# Patient Record
Sex: Female | Born: 1945
Health system: Southern US, Community
[De-identification: ages and names within clinical notes are randomized; demographics above are authoritative.]

## PROBLEM LIST (undated history)

## (undated) DIAGNOSIS — M1990 Unspecified osteoarthritis, unspecified site: Secondary | ICD-10-CM

## (undated) DIAGNOSIS — F419 Anxiety disorder, unspecified: Secondary | ICD-10-CM

## (undated) DIAGNOSIS — E785 Hyperlipidemia, unspecified: Secondary | ICD-10-CM

## (undated) DIAGNOSIS — F32A Depression, unspecified: Secondary | ICD-10-CM

## (undated) DIAGNOSIS — I10 Essential (primary) hypertension: Secondary | ICD-10-CM

## (undated) DIAGNOSIS — R112 Nausea with vomiting, unspecified: Secondary | ICD-10-CM

## (undated) DIAGNOSIS — G8929 Other chronic pain: Secondary | ICD-10-CM

## (undated) DIAGNOSIS — K219 Gastro-esophageal reflux disease without esophagitis: Secondary | ICD-10-CM

## (undated) DIAGNOSIS — H3530 Unspecified macular degeneration: Secondary | ICD-10-CM

## (undated) DIAGNOSIS — Z9889 Other specified postprocedural states: Secondary | ICD-10-CM

## (undated) DIAGNOSIS — M8580 Other specified disorders of bone density and structure, unspecified site: Secondary | ICD-10-CM

## (undated) DIAGNOSIS — E039 Hypothyroidism, unspecified: Secondary | ICD-10-CM

## (undated) DIAGNOSIS — R7303 Prediabetes: Secondary | ICD-10-CM

## (undated) HISTORY — PX: ANTERIOR CERVICAL DISCECTOMY: SHX1160

## (undated) HISTORY — PX: POSTERIOR CERVICAL FUSION/FORAMINOTOMY: SHX5038

## (undated) HISTORY — PX: COLONOSCOPY: SHX174

## (undated) HISTORY — PX: BACK SURGERY: SHX140

## (undated) HISTORY — DX: Prediabetes: R73.03

## (undated) HISTORY — PX: BREAST CYST EXCISION: SHX579

## (undated) HISTORY — PX: HAND SURGERY: SHX662

## (undated) HISTORY — PX: CHOLECYSTECTOMY: SHX55

## (undated) HISTORY — PX: APPENDECTOMY: SHX54

## (undated) HISTORY — PX: ABDOMINAL HYSTERECTOMY: SHX81

## (undated) HISTORY — PX: BUNIONECTOMY: SHX129

## (undated) HISTORY — DX: Hyperlipidemia, unspecified: E78.5

## (undated) HISTORY — DX: Other chronic pain: G89.29

## (undated) HISTORY — DX: Other specified disorders of bone density and structure, unspecified site: M85.80

---

## 1999-03-21 ENCOUNTER — Other Ambulatory Visit: Admission: RE | Admit: 1999-03-21 | Discharge: 1999-03-21 | Payer: Self-pay | Admitting: Obstetrics and Gynecology

## 2000-05-01 ENCOUNTER — Other Ambulatory Visit: Admission: RE | Admit: 2000-05-01 | Discharge: 2000-05-01 | Payer: Self-pay | Admitting: Obstetrics and Gynecology

## 2000-10-26 ENCOUNTER — Ambulatory Visit (HOSPITAL_COMMUNITY): Admission: RE | Admit: 2000-10-26 | Discharge: 2000-10-26 | Payer: Self-pay | Admitting: Neurosurgery

## 2000-10-26 ENCOUNTER — Encounter: Payer: Self-pay | Admitting: Neurosurgery

## 2000-11-28 ENCOUNTER — Encounter: Payer: Self-pay | Admitting: Neurosurgery

## 2000-11-28 ENCOUNTER — Ambulatory Visit (HOSPITAL_COMMUNITY): Admission: RE | Admit: 2000-11-28 | Discharge: 2000-11-28 | Payer: Self-pay | Admitting: Neurosurgery

## 2001-07-02 ENCOUNTER — Encounter: Payer: Self-pay | Admitting: Neurosurgery

## 2001-07-06 ENCOUNTER — Encounter: Payer: Self-pay | Admitting: Neurosurgery

## 2001-07-06 ENCOUNTER — Inpatient Hospital Stay (HOSPITAL_COMMUNITY): Admission: RE | Admit: 2001-07-06 | Discharge: 2001-07-08 | Payer: Self-pay | Admitting: Neurosurgery

## 2001-07-30 ENCOUNTER — Encounter: Payer: Self-pay | Admitting: Neurosurgery

## 2001-07-30 ENCOUNTER — Encounter: Admission: RE | Admit: 2001-07-30 | Discharge: 2001-07-30 | Payer: Self-pay | Admitting: Neurosurgery

## 2001-09-06 ENCOUNTER — Other Ambulatory Visit: Admission: RE | Admit: 2001-09-06 | Discharge: 2001-09-06 | Payer: Self-pay | Admitting: Obstetrics and Gynecology

## 2001-09-28 ENCOUNTER — Encounter: Payer: Self-pay | Admitting: Neurosurgery

## 2001-09-28 ENCOUNTER — Ambulatory Visit (HOSPITAL_COMMUNITY): Admission: RE | Admit: 2001-09-28 | Discharge: 2001-09-28 | Payer: Self-pay | Admitting: Neurosurgery

## 2001-10-19 ENCOUNTER — Encounter: Payer: Self-pay | Admitting: Neurosurgery

## 2001-10-19 ENCOUNTER — Encounter: Admission: RE | Admit: 2001-10-19 | Discharge: 2001-10-19 | Payer: Self-pay | Admitting: Neurosurgery

## 2001-11-03 ENCOUNTER — Encounter: Payer: Self-pay | Admitting: Neurosurgery

## 2001-11-03 ENCOUNTER — Encounter: Admission: RE | Admit: 2001-11-03 | Discharge: 2001-11-03 | Payer: Self-pay | Admitting: Neurosurgery

## 2001-11-19 ENCOUNTER — Encounter: Admission: RE | Admit: 2001-11-19 | Discharge: 2001-11-19 | Payer: Self-pay | Admitting: Neurosurgery

## 2001-11-19 ENCOUNTER — Encounter: Payer: Self-pay | Admitting: Neurosurgery

## 2002-11-23 ENCOUNTER — Other Ambulatory Visit: Admission: RE | Admit: 2002-11-23 | Discharge: 2002-11-23 | Payer: Self-pay | Admitting: Obstetrics and Gynecology

## 2003-10-09 ENCOUNTER — Encounter: Admission: RE | Admit: 2003-10-09 | Discharge: 2003-10-09 | Payer: Self-pay | Admitting: Infectious Diseases

## 2004-01-16 ENCOUNTER — Inpatient Hospital Stay (HOSPITAL_COMMUNITY): Admission: RE | Admit: 2004-01-16 | Discharge: 2004-01-18 | Payer: Self-pay | Admitting: Neurosurgery

## 2005-01-16 ENCOUNTER — Other Ambulatory Visit: Admission: RE | Admit: 2005-01-16 | Discharge: 2005-01-16 | Payer: Self-pay | Admitting: Obstetrics and Gynecology

## 2006-04-07 ENCOUNTER — Ambulatory Visit (HOSPITAL_COMMUNITY): Admission: RE | Admit: 2006-04-07 | Discharge: 2006-04-08 | Payer: Self-pay | Admitting: Neurosurgery

## 2006-04-14 ENCOUNTER — Ambulatory Visit (HOSPITAL_COMMUNITY): Admission: RE | Admit: 2006-04-14 | Discharge: 2006-04-15 | Payer: Self-pay | Admitting: Neurosurgery

## 2008-03-24 ENCOUNTER — Inpatient Hospital Stay (HOSPITAL_COMMUNITY): Admission: RE | Admit: 2008-03-24 | Discharge: 2008-03-25 | Payer: Self-pay | Admitting: Neurosurgery

## 2011-04-01 NOTE — H&P (Signed)
NAMEKYLAN, VEACH               ACCOUNT NO.:  [id number]   MEDICAL RECORD NO.:  [id number]          PATIENT TYPE:  INP   LOCATION:  3005                         FACILITY:  [facility]   PHYSICIAN:  Payton Doughty, M.D.      DATE OF BIRTH:  1945-11-23   DATE OF ADMISSION:  03/24/2008  DATE OF DISCHARGE:                              HISTORY & PHYSICAL   ADMITTING DIAGNOSIS:  Nonfunctioning spinal cord stimulator.   SURGEON:  Payton Doughty, MD, neurosurgery.   BODY OF TEXT:  This is a 65 year old right-handed white girl who has had  spinal cord stimulator in for back pain for about 4 years.  It worked  pretty well for a couple of years, but she has had increasing pain and  is not really happy with the amount of coverage that she is getting, so  she is here to remove the stimulator.   MEDICAL HISTORY:  Reasonably benign.  She has had hypothyroidism and she  is on Premarin.  She has had a cholecystectomy and hysterectomy.  She  has also had an anterior decompression and fusion and posterior cervical  fusion.   She gets nauseated with CODEINE.   SOCIAL HISTORY:  Does not smoke, does not drink, and she is currently  retired.   REVIEW OF SYSTEMS:  Marked for glasses, balance disturbance secondary to  macular degeneration, sore throat, leg pain, leg weakness, back pain,  neck pain, thyroid disease, and difficulty concentration.  Her HEENT  exam within normal limits.  She has reasonable range of motion of neck.  CHEST: Clear.  CARDIAC Exam: Regular rate and rhythm.  ABDOMEN: Large,  but nontender with no hepatosplenomegaly.  EXTREMITIES: Without clubbing  or cyanosis.  GU Exam: Deferred.  Peripheral pulses are good.  NEUROLOGICAL: She is awake, alert and oriented.  Cranial nerves are  intact.  Motor exam shows 5/5 strength throughout the upper and lower  extremities.  There is no evidence of myelopathy.  Reflexes are absent  at the knees and ankles.  Her incisions of her stimulator were well  healed.   CLINICAL IMPRESSION:  Nonfunctioning spinal cord stimulator for removal,  so we can get an MRI of that.  The risks and benefits of this approach  have been discussed with her and she wished to proceed.           ______________________________  Payton Doughty, M.D.     MWR/MEDQ  D:  03/24/2008  T:  03/25/2008  Job:  [id number]

## 2011-04-01 NOTE — Op Note (Signed)
NAMEJANYLA, [name]               ACCOUNT NO.:  [id number]   MEDICAL RECORD NO.:  [id number]          PATIENT TYPE:  INP   LOCATION:  3005                         FACILITY:  [facility]   PHYSICIAN:  Payton Doughty, M.D.      DATE OF BIRTH:  1946-11-09   DATE OF PROCEDURE:  03/24/2008  DATE OF DISCHARGE:                               OPERATIVE REPORT   PREOPERATIVE DIAGNOSIS:  Nonfunctioning spinal cord stimulator.   POSTOPERATIVE DIAGNOSIS:  Nonfunctioning spinal cord stimulator.   PROCEDURE:  Removal of spinal cord stimulator.   DICTATING DOCTOR:  Payton Doughty, MD, neurosurgery.   ANESTHESIA:  Endotracheal.   PREP:  Prepped with alcohol wipe.   COMPLICATIONS:  None.   NURSE ASSISTANT:  [place].   BODY TEXT:  This is a 65 year old girl with a spinal cord stimulator and  it has not helped very much, she wants for removal.  Taken to operating  room, the site was intubated, placed prone on the operating table  following shave, prep and drape in the usual sterile fashion.  Old two  skin incisions were re-opened and the wires were visualized and  dissected down underneath the lamina in the thoracic spine.  A small  part of the lamina had to be removed to mobilize the stimulator in the  epidural space.  It was removed without difficulty.  No CSF was noted.  The battery was accessed in the subcutaneous tunnel and removed.  Wire  was divided and removed in their entirety.  Both incisions were  irrigated.  Hemostasis assured, closed with successive layers of 0  Vicryl, 2-0 Vicryl, and 3-0 nylon.  Betadine and Telfa dressings were  applied and the patient returned to recovery room in good condition.           ______________________________  Payton Doughty, M.D.     MWR/MEDQ  D:  03/24/2008  T:  03/25/2008  Job:  [id number]

## 2011-04-04 NOTE — Op Note (Signed)
[facility]  Patient:    JOURDAN, MALDONADO Visit Number: [id number] MRN: [id number]          Service Type: SUR Location: 3000 3003 01 Attending Physician:  Emeterio Reeve Proc. Date: 07/06/01 Adm. Date:  07/06/2001                             Operative Report  PREOPERATIVE DIAGNOSIS:  Cervical spondylosis, C4-5, C5-6, and C6-7.  POSTOPERATIVE DIAGNOSIS:  Cervical spondylosis, C4-5, C5-6, and C6-7.  PROCEDURE:  C4-5, C5-6, C6-7 anterior cervical diskectomy and fusion with a Tether plate.  SURGEON:  Payton Doughty, M.D.  ASSISTANT:  Tanya Nones. Jeral Fruit, M.D.  ANESTHESIA:  General endotracheal.  PREPARATION:  Sterile Betadine prep and scrub with alcohol wipe.  COMPLICATIONS:  None.  DESCRIPTION OF PROCEDURE:  A 65 year old right-handed white lady with severe cervical spondylosis and cord compression with early myelopathy.  She was taken to the operating room and smoothly anesthetized and intubated, placed supine on the operating table.  Following shave, prep, and drape in the usual sterile fashion, the skin was incised starting two fingerbreadths below the carotid tubercle paralleling the sternocleidomastoid muscle, extending to three fingerbreadths above the carotid tubercle.  Platysma was identified, elevated, divided, and undermined.  The sternocleidomastoid was identified, and medial dissection revealed the carotid artery, which was retracted laterally to the left, trachea and esophagus retracted laterally to the right, exposing the bones of the anterior cervical spine.  A marker was placed, intraoperative x-ray obtained to confirm correctness of level.  Having confirmed correctness of the level, the longus colli was taken down over C4, C5, C6, and C7 and the Shadow Line self-retaining retractor placed transversely in a cephalocaudal direction.  The diskectomy was carried out at C4-5, C5-6, and C6-7 under gross observation.  The operating  microscope was then brought in and a microdissection technique used to dissect the anterior epidural space, remove the osteophytes and disk material.  At C4-5, there was extensive loss of disk with an in-line osteophyte.  At C5-6, there was, is the level _____ worse, there was an osteophytic bar across the anterior epidural space.  This was removed without difficulty and the neural foramina carefully explored and found to be open.  At 6-7 there was more disk than spur but still significant spurring, more so on the right than on the left.  There was a compression of the right C7 root.  Following complete decompression and exploration of all six nerve roots, making sure that they were decompressed, 7 mm bone grafts were fashioned from patellar allograft and tapped into place. A three-level Tether plate was then placed using 12 mm screws, two in C7, one at C6, one in C5.  The 5-6 screw slightly entered the graft, which was perfectly fine.  An adequate purchase had been obtained.  The C4 screw, the left-sided C4 screw required a rescue screw, but good purchase was once again obtained.  Intraoperative x-ray showed good placement of bone grafts and plate and screws.  The wound was irrigated and hemostasis assured.  The platysma was reapproximated with 3-0 Vicryl in interrupted fashion, subcutaneous tissue was reapproximated with 3-0 Vicryl in interrupted fashion.  The skin was closed with 4-0 Vicryl in running subcuticular fashion.  Benzoin and Steri-Strips were placed and made occlusive with Telfa and OpSite.  The patient then returned to the recovery room in good condition. Attending Physician:  Emeterio Reeve  DD:  07/06/01 TD:  07/07/01 Job: [id number]

## 2011-04-04 NOTE — Op Note (Signed)
NAMELETRICE, POLLOK               ACCOUNT NO.:  [id number]   MEDICAL RECORD NO.:  [id number]          PATIENT TYPE:  AMB   LOCATION:  SDS                          FACILITY:  [facility]   PHYSICIAN:  Payton Doughty, M.D.      DATE OF BIRTH:  1946-05-16   DATE OF PROCEDURE:  04/14/2006  DATE OF DISCHARGE:                                 OPERATIVE REPORT   PREOPERATIVE DIAGNOSIS:  Spinal cord stimulator in place.   POSTOPERATIVE DIAGNOSIS:  Spinal cord stimulator in place.   OPERATIVE PROCEDURE:  Replacement of spinal cord stimulator battery.   SURGEON:  Payton Doughty, M.D.   NURSE ASSISTANT:  [facility].   SERVICE:  Neurosurgery.   ANESTHESIA:  General endotracheal.   PREPARATION:  Prepped with sterile Betadine prep and scrubbed with alcohol  wipe.   COMPLICATIONS:  None.   DESCRIPTION OF PROCEDURE:  [age]-year-old girl with a previously  placed spinal cord stimulator with good coverage was taken to the operating  room and smoothly anesthetized, intubated and placed prone on the operating  table and following shaving, prepped and draped in the usual sterile  fashion.  The old skin incision was reopened and the wires localized.  The  extension wire was disconnected.  A subcutaneous tunnel was created to the  right posterior iliac spine, where a subcutaneous pocket was created.  The  wires were passed and connected to the battery.  Impedance testing revealed  low impedance.  The battery was replaced.  Incisions were closed with  successive layers of 0 Vicryl, 2-0 Vicryl and 3-0 nylon, Betadine and a  Telfa dressing was applied and the patient returned to the recovery room in  good condition.           ______________________________  Payton Doughty, M.D.     MWR/MEDQ  D:  04/14/2006  T:  04/14/2006  Job:  [id number]

## 2011-04-04 NOTE — H&P (Signed)
[place]  Patient:    [name], MUN Visit Number: [id number] MRN: [id number]          Service Type: Attending:  Payton Doughty, M.D. Adm. Date:  07/06/01                           History and Physical  ADMITTING DIAGNOSIS:  Cervical spondylosis with myelopathy.  SERVICE:  Neurosurgery.  HISTORY OF PRESENT ILLNESS:  This is a 65 year old right-handed white lady I have been seeing since November; initially saw her for back pain.  She actually resolved that.  She also complained of neck, shoulder, and arm pain on the right side with some dysesthesias to the ring and middle finger on the right and she found some triceps weakness.  She underwent an MRI of her cervical spine that showed severe spondylosis at C4-5, C5-6, and C6-C7. Because of cord compression she is now admitted for anterior cervical diskectomy and fusion at those levels.  MEDICAL HISTORY:  Remarkable for cholecystectomy and hysterectomy in 1971 and 1991 respectively.  She has hypothyroidism for which she is on Synthroid.  MEDICATIONS:  Synthroid 0.05 mg a day and Premarin 0.625 mg a day.  ALLERGIES:  She gets headaches and nausea with CODEINE but does not have any allergies.  SOCIAL HISTORY:  She does not smoke, does not drink, was very busy taking care of her parent who has since passed away, and she has a full-time job in the [employer].  FAMILY HISTORY:  Her mom is 7 with hypertension.  Father had ankylosing spondylitis coronary artery disease; he did at 4.  REVIEW OF SYSTEMS:  Remarkable for glasses, balance disturbance secondary to macular degeneration, occasional sore throat, leg pain, leg weakness, back pain, neck pain, thyroid disease, difficulty with concentration.  PHYSICAL EXAMINATION:  HEENT:  Within normal limits.  NECK:  There is reasonable range of motion of her neck.  CHEST:  Clear.  CARDIAC:  Regular rate and rhythm.  ABDOMEN:  Nontender with  no hepatosplenomegaly.  EXTREMITIES:  Without clubbing or cyanosis.  GENITOURINARY:  Deferred.  PERIPHERAL PULSES:  Good.  NEUROLOGIC:  She is awake, alert, and oriented.  Her cranial nerves are intact.  Motor exam shows 5/5 strength throughout the upper extremity on the left side;  right upper extremity has 4/5 weakness of the triceps.  She has a right C7 and C6 sensory deficit.  Reflexes are 1 at the biceps, absent at the triceps, 1 at the brachioradialis, and Hoffmanns is positive on the right side, and on the left side the reflexes are 2 with no Hoffmanns.  Lower extremities have full strength, downgoing toes.  Reflexes are 1 at the knees, 1 at the ankles.  Straight leg raise is negative.  LABORATORY DATA:  MRI as outlined above shows severe spondylitic change at 4-5, 5-6, and 6-7 with cord compression.  CLINICAL IMPRESSION:  Cervical spondylosis with myelopathy.  PLAN:  Anterior cervical diskectomy and fusion at C4-5, C5-6, and C6-C7.  The risks and benefits of this approach have been discussed with her and she wishes to proceed. Attending:  Payton Doughty, M.D. DD:  07/06/01 TD:  07/06/01 Job: [id number]

## 2011-04-04 NOTE — Op Note (Signed)
[name], [name]               ACCOUNT NO.:  [id number]   MEDICAL RECORD NO.:  [id number]          PATIENT TYPE:  AMB   LOCATION:  SDS                          FACILITY:  [facility]   PHYSICIAN:  Payton Doughty, M.D.      DATE OF BIRTH:  Jan 02, 1946   DATE OF PROCEDURE:  04/07/2006  DATE OF DISCHARGE:                                 OPERATIVE REPORT   PREOPERATIVE DIAGNOSIS:  Intractable back pain.   POSTOPERATIVE DIAGNOSIS:  Intractable back pain.   OPERATIVE PROCEDURE:  Placement of spinal cord stimulator.   SURGEON:  Payton Doughty, MD, Department of Neurosurgery.   ANESTHESIA:  General endotracheal.   PREP:  __________and prepped with alcohol wipe.   COMPLICATIONS:  None.   NURSE ASSISTANT:  [place].   BODY OF TEXT:  A 65 year old girl with intractable back pain taken to the  operating room, smoothly anesthetized and intubated, placed prone on the  operating table, the back shaved, prepped, and draped in the usual sterile  fashion.  The skin was incised from mid T12 to mid T11, and the ligament of  T11 was dissected free in the subperiosteal plane.  Intraoperative x-ray  confirmed correctness level.  A partial laminectomy was performed to allow  access to the epidural space, and the tripolar electrode was advanced  posteriorly in the epidural space.  The electrode was advanced so it was  covering from the top of T10 to the top of T8 as this patient's pain was  mostly in her back up high.  Following confirmation of good placement, the  anchoring leads were placed.  Extension wires were attached.  They were  exited via the trocar.  The incision was irrigated and hemostasis was  assured, then closed with six successive layers of #0 Vicryl, #2-0 Vicryl,  and #3-0 nylon.  Both sites were dressed with Betadine and Telfa and made  occlusive with OP-Site, and the patient returned to the recovery room in  good condition.   PREP:           ______________________________  Payton Doughty, M.D.     MWR/MEDQ  D:  04/07/2006  T:  04/07/2006  Job:  [id number]

## 2011-04-04 NOTE — H&P (Signed)
[name], [name]               ACCOUNT NO.:  [id number]   MEDICAL RECORD NO.:  [id number]          PATIENT TYPE:  AMB   LOCATION:  SDS                          FACILITY:  [facility]   PHYSICIAN:  Payton Doughty, M.D.      DATE OF BIRTH:  1946-09-14   DATE OF ADMISSION:  04/14/2006  DATE OF DISCHARGE:                                HISTORY & PHYSICAL   ADMISSION DIAGNOSIS:  Implanted spinal cord stimulator with good coverage.   A 65 year old, right-hand, white lady 1 week ago underwent implantation of a  spinal cord stimulator for intractable back pain and has had good coverage  and is now admitted for placement of battery pack.   MEDICAL HISTORY:  Remarkable for cholecystectomy and hysterectomy in 1971  and 1991.  She has had an anterior decompression and fusion of C4-C7, also a  posterior augmentation several years later.  She is allergic to DEMEROL and  CODEINE.  Takes Darvocet.   FAMILY HISTORY:  Father is deceased from coronary artery disease.  Mother is  43 with hypertension.   REVIEW OF SYSTEMS:  Remarkable for glasses.  Blind secondary for macular  degeneration.  Sore throat, leg pain, leg weakness, back pain, neck pain,  thyroid disease, difficulty with concentration.   PHYSICAL EXAMINATION:  HEENT exam:  Within normal limits.  She has good range of motion of her neck.  CHEST:  Clear.  CARDIAC EXAM:  Regular rate and rhythm.  ABDOMEN:  Large, but nontender.  No hepatosplenomegaly.  EXTREMITIES: Without clubbing or cyanosis.  GU EXAM:  Deferred.  Peripheral pulses are good.  NEUROLOGIC:  She is awake, alert, and oriented.  Cranial nerves are intact.  Motor exam shows 5/5 strength throughout the upper and lower extremities.  There is no current sensory deficit.  Reflexes are nonmyelopathic at the  knees, absent at the ankles.  Straight-leg raise is negative.  She has  limited range of motion of her spine.  Her back bothers her when she has  been up and about.  Her studies  demonstrate spondylosis at all levels, worse  at 3-4 and 4-5.  The implantation site looks good.   CLINICAL IMPRESSION:  Effective spinal cord stimulator.  She is here for a  battery implant.  The risks and benefits of this approach have been  discussed with her and she wished to proceed.           ______________________________  Payton Doughty, M.D.     MWR/MEDQ  D:  04/14/2006  T:  04/14/2006  Job:  [id number]

## 2011-04-04 NOTE — H&P (Signed)
[name], [name]                           ACCOUNT NO.:  [id number]   MEDICAL RECORD NO.:  [id number]                   PATIENT TYPE:  INP   LOCATION:  2899                                 FACILITY:  [facility]   PHYSICIAN:  [name], M.D.                   DATE OF BIRTH:  [birth year]   DATE OF ADMISSION:  01/16/2004  DATE OF DISCHARGE:                                HISTORY & PHYSICAL   ADMISSION DIAGNOSIS:  Non-union at C6-7.   HISTORY:  This is a very nice now 65 year old right-handed white female who  underwent an intracervical discectomy and fusion from C4-C7 in August of  2002. She did well and then during a chiropractic x-ray was found to have a  non-union at C6-7 as well as a broken screw. She is now admitted for  posterior augmentation and revision.   PAST MEDICAL HISTORY:  Remarkable for a cholecystectomy and hysterectomy in  1971 and 1991, respectively. She has hypothyroidism for which she is on  Synthroid and a Vivelle patch. She takes Bextra once a day and  hydrochlorothiazide once a day.   ALLERGIES:  She is sensitive to Demerol and Codeine and its derivatives.   FAMILY HISTORY:  Father is dead from coronary artery disease. Mom is 81 with  hypertension.   REVIEW OF SYSTEMS:  Remarkable for glasses and balance disturbance secondary  to macular degeneration. Occasional sore throat, leg pain, leg weakness,  back pain, neck pain, thyroid disease, and difficulty with concentration.   PHYSICAL EXAMINATION:  HEENT:  Within normal limits.  NECK:  She has reasonable range of motion in her neck with some neck pain.  CHEST:  Clear.  CARDIAC:  Regular rate and rhythm.  ABDOMEN:  Nontender with no hepatosplenomegaly.  EXTREMITIES:  Without clubbing or cyanosis. Peripheral pulses are good.  GU:  Deferred.  NEUROLOGIC:  She is awake, alert, and oriented. Her cranial nerves are  intact. Motor exam shows 5/5 strength throughout the upper and lower  extremities. There is no  Kauffman's nor is she myelopathic.   CLINICAL INFORMATION:  Pseudoarthrosis of neck pain.   PLAN:  Posterior augmentation of the C6-7 level. The risks and benefits of  this approach have been discussed with her and she wishes to proceed.                                                [name], M.D.    MWR/MEDQ  D:  01/16/2004  T:  01/16/2004  Job:  [id number]

## 2011-04-04 NOTE — Op Note (Signed)
[name], [name]                           ACCOUNT NO.:  [id number]   MEDICAL RECORD NO.:  [id number]                   PATIENT TYPE:  INP   LOCATION:  2899                                 FACILITY:  [facility]   PHYSICIAN:  Payton Doughty, M.D.                   DATE OF BIRTH:  24-Dec-1945   DATE OF PROCEDURE:  01/16/2004  DATE OF DISCHARGE:                                 OPERATIVE REPORT   PREOPERATIVE DIAGNOSIS:  Nonunion C6-C7.   POSTOPERATIVE DIAGNOSIS:  Nonunion C6-C7.   OPERATIVE PROCEDURE:  C6-C7 posterior cervical fusion augmentation.   SURGEON:  Payton Doughty, M.D.   SERVICE:  Neurosurgery   ANESTHESIA:  General endotracheal anesthesia.   PREPARATION:  Betadine and alcohol wipe.   COMPLICATIONS:  None.   ASSISTANT:  Nurse assistant [facility]  Doctor assistant Hewitt Shorts, M.D.   BODY OF TEXT:  65 year old lady with fusion from C3 to C7 and a nonunion at  C5-C6.  She is taken to the operating room, general anesthesia induced, and  intubated, placed prone in the [place] head holder.  Following shave, prep,  and drape in the usual sterile fashion, the skin was incised from the top of  C4 to the bottom of C7 and the lamina of C5, C6, and C7, were exposed  bilaterally in a subperiosteal plane.  The interoperative x-ray confirmed  correctness of the level.  Having confirmed correctness of the level,  lateral mass screws were placed in C7 and C6 using the standard landmarks.  Interoperative x-ray showed acceptable placement of the screws.  They were  connected with the connecting rod and locked in place.  C6 and C7 lamina and  facet joint were decorticated and packed with bone graft made from  morselized allograft and DBX.  Prior to this, the entire area had been  irrigated and hemostasis assured.  The fascia was reapproximated with 0  Vicryl in an interrupted fashion.  The subcutaneous tissues were  reapproximated with 0 Vicryl in an interrupted fashion.  The  subcuticular  tissues were reapproximated with 3-0 Vicryl in an interrupted fashion.  The  skin was closed with 3-0 nylon in a running locked fashion.  Betadine and  Telfa dressing was applied and made occlusive with OpSite and the patient  returned to the recovery room in good condition.                                               Payton Doughty, M.D.    MWR/MEDQ  D:  01/16/2004  T:  01/16/2004  Job:  [id number]

## 2011-04-04 NOTE — H&P (Signed)
[name], [name]               ACCOUNT NO.:  [id number]   MEDICAL RECORD NO.:  [id number]          PATIENT TYPE:  AMB   LOCATION:  [facility]                         FACILITY:  [facility]   PHYSICIAN:  Payton Doughty, M.D.      DATE OF BIRTH:  04-05-1946   DATE OF ADMISSION:  04/07/2006  DATE OF DISCHARGE:                                HISTORY & PHYSICAL   ADMISSION DIAGNOSIS:  Extensive lumbar spondylosis and with intractable back  pain.   BODY OF TEXT:  A now 65 year old right-handed white lady who had back pain  for numerous years, has a family history on both sides of intractable back  pain and lumbar spondylosis.  She is a candidate for fusion but because she  does not have any neurologic deficit would like to pursue a non-fusion  intervention for pain control, and she was thus admitted for a spinal cord  stimulator.   Medical issues remarkable for a cholecystectomy and a hysterectomy in 1971  and 1991.  She has also had anterior decompression and fusion from C4 to C7  and posterior augmentation several years later.   ALLERGIES:  DEMEROL and CODEINE.   MEDICATIONS:  Darvocet.   FAMILY HISTORY:  Her daddy is dead from coronary artery disease.  Mother is  24 with hypertension.   REVIEW OF SYSTEMS:  Remarkable for glasses __________ secondary to macular  degeneration, sore throat, leg pain, leg weakness, back pain, neck pain,  thyroid disease, difficulty with concentration.  She also had a toothache  and has been on antibiotic but has no evidence of abscess in her mouth.   PHYSICAL EXAMINATION:  HEENT:  Within normal limits.  NECK:  She has good range of motion of her neck.  CHEST:  Clear.  CARDIAC:  Regular rate and rhythm.  ABDOMEN:  Large but nontender with no hepatosplenomegaly.  EXTREMITIES:  Without any clubbing or cyanosis.  GENITOURINARY:  Deferred.  VASCULAR:  Peripheral pulses are good.  NEUROLOGIC:  She is awake, alert and oriented.  Cranial nerves are intact.  Motor  exam shows 5/5 strength throughout the upper and lower extremities.  There is no current sensory deficit.  Reflexes are non-myelopathic at the  knees, absent at the ankles.  Straight leg raise is negative.  She has  limited range of motion of her spine and her back bothers her when she has  been up and about.   Studies demonstrate spondylosis at all levels, probably worse at L4-5 than  L3-4.   CLINICAL IMPRESSION:  Intractable back pain related to spondylosis.   PLAN:  Placement of spinal cord stimulator.  The risks and benefits of this  approach have been discussed with her, and she wishes to proceed.           ______________________________  Payton Doughty, M.D.     MWR/MEDQ  D:  04/07/2006  T:  04/07/2006  Job:  [id number]

## 2011-08-13 LAB — COMPREHENSIVE METABOLIC PANEL
ALT: 25
AST: 29
Albumin: 4.1
Alkaline Phosphatase: 85
BUN: 19
CO2: 29
Calcium: 9.8
Chloride: 96
Creatinine, Ser: 0.79
GFR calc Af Amer: >60
GFR calc non Af Amer: >60
Glucose, Bld: 98
Potassium: 3.4 — ABNORMAL LOW
Sodium: 138
Total Bilirubin: 0.6
Total Protein: 7.7

## 2011-08-13 LAB — URINALYSIS, ROUTINE W REFLEX MICROSCOPIC
Bilirubin Urine: NEGATIVE
Glucose, UA: NEGATIVE
Hgb urine dipstick: NEGATIVE
Ketones, ur: NEGATIVE
Nitrite: NEGATIVE
Protein, ur: NEGATIVE
Specific Gravity, Urine: 1.014
Urobilinogen, UA: 0.2
pH: 5

## 2011-08-13 LAB — DIFFERENTIAL
Basophils Absolute: 0
Basophils Relative: 0
Eosinophils Absolute: 0.1
Eosinophils Relative: 1
Lymphocytes Relative: 35
Lymphs Abs: 2.8
Monocytes Absolute: 0.7
Monocytes Relative: 8
Neutro Abs: 4.4
Neutrophils Relative %: 55

## 2011-08-13 LAB — CBC
HCT: 44.5
Hemoglobin: 15.3 — ABNORMAL HIGH
MCHC: 34.4
MCV: 92.1
Platelets: 301
RBC: 4.83
RDW: 13.1
WBC: 8

## 2011-08-13 LAB — PROTIME-INR
INR: 0.9
Prothrombin Time: 12.6

## 2011-08-13 LAB — APTT: aPTT: 31

## 2013-03-23 ENCOUNTER — Other Ambulatory Visit: Payer: Self-pay | Admitting: Neurosurgery

## 2013-03-23 DIAGNOSIS — M47817 Spondylosis without myelopathy or radiculopathy, lumbosacral region: Secondary | ICD-10-CM

## 2013-03-30 ENCOUNTER — Ambulatory Visit
Admission: RE | Admit: 2013-03-30 | Discharge: 2013-03-30 | Disposition: A | Discharge status: Home / Self Care | Payer: Medicare Other | Source: Ambulatory Visit | Attending: Neurosurgery | Admitting: Neurosurgery

## 2013-03-30 DIAGNOSIS — M47817 Spondylosis without myelopathy or radiculopathy, lumbosacral region: Secondary | ICD-10-CM

## 2013-04-05 ENCOUNTER — Other Ambulatory Visit: Payer: Self-pay | Admitting: Family Medicine

## 2013-04-05 DIAGNOSIS — Z1231 Encounter for screening mammogram for malignant neoplasm of breast: Secondary | ICD-10-CM

## 2013-04-18 ENCOUNTER — Encounter (INDEPENDENT_AMBULATORY_CARE_PROVIDER_SITE_OTHER): Payer: Medicare Other | Admitting: Ophthalmology

## 2013-04-18 DIAGNOSIS — H35039 Hypertensive retinopathy, unspecified eye: Secondary | ICD-10-CM

## 2013-04-18 DIAGNOSIS — H3530 Unspecified macular degeneration: Secondary | ICD-10-CM

## 2013-04-18 DIAGNOSIS — H2510 Age-related nuclear cataract, unspecified eye: Secondary | ICD-10-CM

## 2013-04-18 DIAGNOSIS — I10 Essential (primary) hypertension: Secondary | ICD-10-CM

## 2013-04-19 ENCOUNTER — Ambulatory Visit
Admission: RE | Admit: 2013-04-19 | Discharge: 2013-04-19 | Disposition: A | Discharge status: Home / Self Care | Payer: Medicare Other | Source: Ambulatory Visit | Attending: Family Medicine | Admitting: Family Medicine

## 2013-04-19 DIAGNOSIS — Z1231 Encounter for screening mammogram for malignant neoplasm of breast: Secondary | ICD-10-CM

## 2013-05-09 ENCOUNTER — Other Ambulatory Visit: Payer: Self-pay | Admitting: Family Medicine

## 2013-05-09 DIAGNOSIS — R928 Other abnormal and inconclusive findings on diagnostic imaging of breast: Secondary | ICD-10-CM

## 2013-05-23 ENCOUNTER — Ambulatory Visit
Admission: RE | Admit: 2013-05-23 | Discharge: 2013-05-23 | Disposition: A | Discharge status: Home / Self Care | Payer: Medicare Other | Source: Ambulatory Visit | Attending: Family Medicine | Admitting: Family Medicine

## 2013-05-23 DIAGNOSIS — R928 Other abnormal and inconclusive findings on diagnostic imaging of breast: Secondary | ICD-10-CM

## 2014-06-07 ENCOUNTER — Ambulatory Visit (INDEPENDENT_AMBULATORY_CARE_PROVIDER_SITE_OTHER): Payer: Medicare Other | Admitting: Ophthalmology

## 2014-07-05 ENCOUNTER — Ambulatory Visit (INDEPENDENT_AMBULATORY_CARE_PROVIDER_SITE_OTHER): Payer: Medicare Other | Admitting: Ophthalmology

## 2014-07-17 ENCOUNTER — Ambulatory Visit (INDEPENDENT_AMBULATORY_CARE_PROVIDER_SITE_OTHER): Payer: Medicare Other | Admitting: Ophthalmology

## 2014-08-24 ENCOUNTER — Ambulatory Visit (INDEPENDENT_AMBULATORY_CARE_PROVIDER_SITE_OTHER): Payer: Medicare Other | Admitting: Ophthalmology

## 2014-10-05 ENCOUNTER — Ambulatory Visit (INDEPENDENT_AMBULATORY_CARE_PROVIDER_SITE_OTHER): Payer: Medicare Other | Admitting: Ophthalmology

## 2014-10-05 DIAGNOSIS — H43813 Vitreous degeneration, bilateral: Secondary | ICD-10-CM

## 2014-10-05 DIAGNOSIS — I10 Essential (primary) hypertension: Secondary | ICD-10-CM

## 2014-10-05 DIAGNOSIS — H3531 Nonexudative age-related macular degeneration: Secondary | ICD-10-CM

## 2014-10-05 DIAGNOSIS — H35033 Hypertensive retinopathy, bilateral: Secondary | ICD-10-CM

## 2014-12-04 ENCOUNTER — Other Ambulatory Visit: Payer: Self-pay

## 2014-12-04 DIAGNOSIS — Z1231 Encounter for screening mammogram for malignant neoplasm of breast: Secondary | ICD-10-CM

## 2014-12-05 ENCOUNTER — Ambulatory Visit
Admission: RE | Admit: 2014-12-05 | Discharge: 2014-12-05 | Disposition: A | Discharge status: Home / Self Care | Payer: Medicare Other | Source: Ambulatory Visit

## 2014-12-05 DIAGNOSIS — Z1231 Encounter for screening mammogram for malignant neoplasm of breast: Secondary | ICD-10-CM

## 2015-10-22 ENCOUNTER — Ambulatory Visit (INDEPENDENT_AMBULATORY_CARE_PROVIDER_SITE_OTHER): Payer: Medicare Other | Admitting: Ophthalmology

## 2016-02-19 ENCOUNTER — Other Ambulatory Visit: Payer: Self-pay

## 2016-02-19 DIAGNOSIS — Z1231 Encounter for screening mammogram for malignant neoplasm of breast: Secondary | ICD-10-CM

## 2016-03-19 ENCOUNTER — Ambulatory Visit
Admission: RE | Admit: 2016-03-19 | Discharge: 2016-03-19 | Disposition: A | Discharge status: Home / Self Care | Payer: Medicare Other | Source: Ambulatory Visit

## 2016-03-19 DIAGNOSIS — Z1231 Encounter for screening mammogram for malignant neoplasm of breast: Secondary | ICD-10-CM

## 2017-03-11 ENCOUNTER — Other Ambulatory Visit: Payer: Self-pay | Admitting: Family Medicine

## 2017-03-11 DIAGNOSIS — Z1231 Encounter for screening mammogram for malignant neoplasm of breast: Secondary | ICD-10-CM

## 2017-04-01 ENCOUNTER — Ambulatory Visit: Payer: Medicare Other

## 2017-04-15 ENCOUNTER — Ambulatory Visit
Admission: RE | Admit: 2017-04-15 | Discharge: 2017-04-15 | Disposition: A | Discharge status: Home / Self Care | Payer: Medicare Other | Source: Ambulatory Visit | Attending: Family Medicine | Admitting: Family Medicine

## 2017-04-15 DIAGNOSIS — Z1231 Encounter for screening mammogram for malignant neoplasm of breast: Secondary | ICD-10-CM

## 2017-11-17 DIAGNOSIS — N189 Chronic kidney disease, unspecified: Secondary | ICD-10-CM

## 2017-11-17 HISTORY — DX: Chronic kidney disease, unspecified: N18.9

## 2017-12-01 ENCOUNTER — Other Ambulatory Visit: Payer: Self-pay | Admitting: Neurosurgery

## 2017-12-01 DIAGNOSIS — M47816 Spondylosis without myelopathy or radiculopathy, lumbar region: Secondary | ICD-10-CM

## 2017-12-06 ENCOUNTER — Inpatient Hospital Stay
Admission: RE | Admit: 2017-12-06 | Discharge: 2017-12-06 | Disposition: A | Discharge status: Home / Self Care | Payer: Medicare Other | Source: Ambulatory Visit | Attending: Neurosurgery | Admitting: Neurosurgery

## 2017-12-06 ENCOUNTER — Ambulatory Visit
Admission: RE | Admit: 2017-12-06 | Discharge: 2017-12-06 | Disposition: A | Discharge status: Home / Self Care | Payer: Medicare Other | Source: Ambulatory Visit | Attending: Neurosurgery | Admitting: Neurosurgery

## 2017-12-06 DIAGNOSIS — M47816 Spondylosis without myelopathy or radiculopathy, lumbar region: Secondary | ICD-10-CM

## 2017-12-07 ENCOUNTER — Ambulatory Visit
Admission: RE | Admit: 2017-12-07 | Discharge: 2017-12-07 | Disposition: A | Discharge status: Home / Self Care | Payer: Medicare Other | Source: Ambulatory Visit | Attending: Neurosurgery | Admitting: Neurosurgery

## 2017-12-07 DIAGNOSIS — M47816 Spondylosis without myelopathy or radiculopathy, lumbar region: Secondary | ICD-10-CM

## 2018-04-20 ENCOUNTER — Other Ambulatory Visit: Payer: Self-pay | Admitting: Family Medicine

## 2018-04-20 DIAGNOSIS — Z1231 Encounter for screening mammogram for malignant neoplasm of breast: Secondary | ICD-10-CM

## 2018-05-12 ENCOUNTER — Ambulatory Visit
Admission: RE | Admit: 2018-05-12 | Discharge: 2018-05-12 | Disposition: A | Discharge status: Home / Self Care | Payer: Medicare Other | Source: Ambulatory Visit | Attending: Family Medicine | Admitting: Family Medicine

## 2018-05-12 DIAGNOSIS — Z1231 Encounter for screening mammogram for malignant neoplasm of breast: Secondary | ICD-10-CM

## 2019-02-01 ENCOUNTER — Other Ambulatory Visit: Payer: Self-pay

## 2019-02-01 ENCOUNTER — Ambulatory Visit
Admission: RE | Admit: 2019-02-01 | Discharge: 2019-02-01 | Disposition: A | Discharge status: Home / Self Care | Payer: Medicare Other | Source: Ambulatory Visit | Attending: Nurse Practitioner | Admitting: Nurse Practitioner

## 2019-02-01 ENCOUNTER — Other Ambulatory Visit: Payer: Self-pay | Admitting: Nurse Practitioner

## 2019-02-01 DIAGNOSIS — M25461 Effusion, right knee: Secondary | ICD-10-CM

## 2019-04-28 ENCOUNTER — Other Ambulatory Visit: Payer: Self-pay | Admitting: Family Medicine

## 2019-04-28 DIAGNOSIS — Z1231 Encounter for screening mammogram for malignant neoplasm of breast: Secondary | ICD-10-CM

## 2019-05-03 ENCOUNTER — Other Ambulatory Visit: Payer: Self-pay | Admitting: Family Medicine

## 2019-05-03 DIAGNOSIS — M8580 Other specified disorders of bone density and structure, unspecified site: Secondary | ICD-10-CM

## 2019-06-14 ENCOUNTER — Ambulatory Visit: Payer: Medicare Other

## 2019-07-22 ENCOUNTER — Ambulatory Visit: Payer: Medicare Other

## 2019-09-02 ENCOUNTER — Other Ambulatory Visit: Payer: Self-pay

## 2019-09-02 ENCOUNTER — Ambulatory Visit
Admission: RE | Admit: 2019-09-02 | Discharge: 2019-09-02 | Disposition: A | Discharge status: Home / Self Care | Payer: Medicare Other | Source: Ambulatory Visit | Attending: Family Medicine | Admitting: Family Medicine

## 2019-09-02 DIAGNOSIS — Z1231 Encounter for screening mammogram for malignant neoplasm of breast: Secondary | ICD-10-CM

## 2020-07-05 ENCOUNTER — Other Ambulatory Visit: Payer: Self-pay | Admitting: Family Medicine

## 2020-07-05 DIAGNOSIS — M85851 Other specified disorders of bone density and structure, right thigh: Secondary | ICD-10-CM

## 2020-07-31 ENCOUNTER — Other Ambulatory Visit: Payer: Self-pay | Admitting: Family Medicine

## 2020-07-31 DIAGNOSIS — Z1231 Encounter for screening mammogram for malignant neoplasm of breast: Secondary | ICD-10-CM

## 2020-11-08 ENCOUNTER — Other Ambulatory Visit: Payer: Medicare Other

## 2020-11-08 ENCOUNTER — Ambulatory Visit: Payer: Medicare Other

## 2020-12-17 DIAGNOSIS — G8929 Other chronic pain: Secondary | ICD-10-CM | POA: Diagnosis not present

## 2020-12-17 DIAGNOSIS — I10 Essential (primary) hypertension: Secondary | ICD-10-CM | POA: Diagnosis not present

## 2020-12-17 DIAGNOSIS — E039 Hypothyroidism, unspecified: Secondary | ICD-10-CM | POA: Diagnosis not present

## 2020-12-17 DIAGNOSIS — M47816 Spondylosis without myelopathy or radiculopathy, lumbar region: Secondary | ICD-10-CM | POA: Diagnosis not present

## 2020-12-17 DIAGNOSIS — N1831 Chronic kidney disease, stage 3a: Secondary | ICD-10-CM | POA: Diagnosis not present

## 2020-12-31 DIAGNOSIS — E559 Vitamin D deficiency, unspecified: Secondary | ICD-10-CM | POA: Diagnosis not present

## 2020-12-31 DIAGNOSIS — J301 Allergic rhinitis due to pollen: Secondary | ICD-10-CM | POA: Diagnosis not present

## 2020-12-31 DIAGNOSIS — E039 Hypothyroidism, unspecified: Secondary | ICD-10-CM | POA: Diagnosis not present

## 2020-12-31 DIAGNOSIS — M85851 Other specified disorders of bone density and structure, right thigh: Secondary | ICD-10-CM | POA: Diagnosis not present

## 2020-12-31 DIAGNOSIS — M5440 Lumbago with sciatica, unspecified side: Secondary | ICD-10-CM | POA: Diagnosis not present

## 2020-12-31 DIAGNOSIS — G8929 Other chronic pain: Secondary | ICD-10-CM | POA: Diagnosis not present

## 2021-01-03 DIAGNOSIS — M461 Sacroiliitis, not elsewhere classified: Secondary | ICD-10-CM | POA: Diagnosis not present

## 2021-01-04 DIAGNOSIS — E039 Hypothyroidism, unspecified: Secondary | ICD-10-CM | POA: Diagnosis not present

## 2021-01-04 DIAGNOSIS — E559 Vitamin D deficiency, unspecified: Secondary | ICD-10-CM | POA: Diagnosis not present

## 2021-01-04 DIAGNOSIS — M85851 Other specified disorders of bone density and structure, right thigh: Secondary | ICD-10-CM | POA: Diagnosis not present

## 2021-01-04 DIAGNOSIS — I10 Essential (primary) hypertension: Secondary | ICD-10-CM | POA: Diagnosis not present

## 2021-01-04 DIAGNOSIS — N1831 Chronic kidney disease, stage 3a: Secondary | ICD-10-CM | POA: Diagnosis not present

## 2021-01-04 DIAGNOSIS — M5440 Lumbago with sciatica, unspecified side: Secondary | ICD-10-CM | POA: Diagnosis not present

## 2021-01-09 DIAGNOSIS — I10 Essential (primary) hypertension: Secondary | ICD-10-CM | POA: Diagnosis not present

## 2021-01-09 DIAGNOSIS — N1831 Chronic kidney disease, stage 3a: Secondary | ICD-10-CM | POA: Diagnosis not present

## 2021-01-09 DIAGNOSIS — M47816 Spondylosis without myelopathy or radiculopathy, lumbar region: Secondary | ICD-10-CM | POA: Diagnosis not present

## 2021-01-09 DIAGNOSIS — E039 Hypothyroidism, unspecified: Secondary | ICD-10-CM | POA: Diagnosis not present

## 2021-01-09 DIAGNOSIS — G8929 Other chronic pain: Secondary | ICD-10-CM | POA: Diagnosis not present

## 2021-01-21 DIAGNOSIS — M48062 Spinal stenosis, lumbar region with neurogenic claudication: Secondary | ICD-10-CM | POA: Diagnosis not present

## 2021-01-21 DIAGNOSIS — I10 Essential (primary) hypertension: Secondary | ICD-10-CM | POA: Diagnosis not present

## 2021-01-21 DIAGNOSIS — M461 Sacroiliitis, not elsewhere classified: Secondary | ICD-10-CM | POA: Diagnosis not present

## 2021-01-21 DIAGNOSIS — M961 Postlaminectomy syndrome, not elsewhere classified: Secondary | ICD-10-CM | POA: Diagnosis not present

## 2021-01-30 DIAGNOSIS — M545 Low back pain, unspecified: Secondary | ICD-10-CM | POA: Diagnosis not present

## 2021-01-30 DIAGNOSIS — M48062 Spinal stenosis, lumbar region with neurogenic claudication: Secondary | ICD-10-CM | POA: Diagnosis not present

## 2021-02-04 DIAGNOSIS — M461 Sacroiliitis, not elsewhere classified: Secondary | ICD-10-CM | POA: Diagnosis not present

## 2021-02-06 DIAGNOSIS — N1831 Chronic kidney disease, stage 3a: Secondary | ICD-10-CM | POA: Diagnosis not present

## 2021-02-06 DIAGNOSIS — G8929 Other chronic pain: Secondary | ICD-10-CM | POA: Diagnosis not present

## 2021-02-06 DIAGNOSIS — E039 Hypothyroidism, unspecified: Secondary | ICD-10-CM | POA: Diagnosis not present

## 2021-02-06 DIAGNOSIS — M47816 Spondylosis without myelopathy or radiculopathy, lumbar region: Secondary | ICD-10-CM | POA: Diagnosis not present

## 2021-02-06 DIAGNOSIS — I10 Essential (primary) hypertension: Secondary | ICD-10-CM | POA: Diagnosis not present

## 2021-02-12 DIAGNOSIS — M48062 Spinal stenosis, lumbar region with neurogenic claudication: Secondary | ICD-10-CM | POA: Diagnosis not present

## 2021-02-13 ENCOUNTER — Other Ambulatory Visit: Payer: Self-pay | Admitting: Neurological Surgery

## 2021-02-13 DIAGNOSIS — M48061 Spinal stenosis, lumbar region without neurogenic claudication: Secondary | ICD-10-CM

## 2021-02-18 ENCOUNTER — Ambulatory Visit
Admission: RE | Admit: 2021-02-18 | Discharge: 2021-02-18 | Disposition: A | Discharge status: Home / Self Care | Payer: Medicare Other | Source: Ambulatory Visit | Attending: Family Medicine | Admitting: Family Medicine

## 2021-02-18 ENCOUNTER — Other Ambulatory Visit: Payer: Medicare Other

## 2021-02-18 ENCOUNTER — Other Ambulatory Visit: Payer: Self-pay

## 2021-02-18 DIAGNOSIS — Z1231 Encounter for screening mammogram for malignant neoplasm of breast: Secondary | ICD-10-CM

## 2021-02-22 ENCOUNTER — Other Ambulatory Visit (HOSPITAL_COMMUNITY)
Admission: RE | Admit: 2021-02-22 | Discharge: 2021-02-22 | Disposition: A | Discharge status: Home / Self Care | Payer: Medicare Other | Source: Ambulatory Visit | Attending: Neurological Surgery | Admitting: Neurological Surgery

## 2021-02-22 ENCOUNTER — Encounter (HOSPITAL_COMMUNITY): Payer: Self-pay | Admitting: Neurological Surgery

## 2021-02-22 DIAGNOSIS — M4326 Fusion of spine, lumbar region: Secondary | ICD-10-CM | POA: Diagnosis not present

## 2021-02-22 DIAGNOSIS — M532X6 Spinal instabilities, lumbar region: Secondary | ICD-10-CM | POA: Diagnosis not present

## 2021-02-22 DIAGNOSIS — F419 Anxiety disorder, unspecified: Secondary | ICD-10-CM | POA: Diagnosis not present

## 2021-02-22 DIAGNOSIS — M48061 Spinal stenosis, lumbar region without neurogenic claudication: Secondary | ICD-10-CM | POA: Diagnosis not present

## 2021-02-22 DIAGNOSIS — Z885 Allergy status to narcotic agent status: Secondary | ICD-10-CM | POA: Diagnosis not present

## 2021-02-22 DIAGNOSIS — Z981 Arthrodesis status: Secondary | ICD-10-CM | POA: Diagnosis not present

## 2021-02-22 DIAGNOSIS — K219 Gastro-esophageal reflux disease without esophagitis: Secondary | ICD-10-CM | POA: Diagnosis not present

## 2021-02-22 DIAGNOSIS — Z01818 Encounter for other preprocedural examination: Secondary | ICD-10-CM | POA: Diagnosis not present

## 2021-02-22 DIAGNOSIS — M47816 Spondylosis without myelopathy or radiculopathy, lumbar region: Secondary | ICD-10-CM | POA: Diagnosis not present

## 2021-02-22 DIAGNOSIS — N189 Chronic kidney disease, unspecified: Secondary | ICD-10-CM | POA: Diagnosis not present

## 2021-02-22 DIAGNOSIS — G8929 Other chronic pain: Secondary | ICD-10-CM | POA: Diagnosis not present

## 2021-02-22 DIAGNOSIS — Z01812 Encounter for preprocedural laboratory examination: Secondary | ICD-10-CM | POA: Insufficient documentation

## 2021-02-22 DIAGNOSIS — Z20822 Contact with and (suspected) exposure to covid-19: Secondary | ICD-10-CM | POA: Insufficient documentation

## 2021-02-22 DIAGNOSIS — Z7982 Long term (current) use of aspirin: Secondary | ICD-10-CM | POA: Diagnosis not present

## 2021-02-22 DIAGNOSIS — Z79890 Hormone replacement therapy (postmenopausal): Secondary | ICD-10-CM | POA: Diagnosis not present

## 2021-02-22 DIAGNOSIS — Z91048 Other nonmedicinal substance allergy status: Secondary | ICD-10-CM | POA: Diagnosis not present

## 2021-02-22 DIAGNOSIS — Z79899 Other long term (current) drug therapy: Secondary | ICD-10-CM | POA: Diagnosis not present

## 2021-02-22 DIAGNOSIS — M4805 Spinal stenosis, thoracolumbar region: Secondary | ICD-10-CM | POA: Diagnosis not present

## 2021-02-22 DIAGNOSIS — N1831 Chronic kidney disease, stage 3a: Secondary | ICD-10-CM | POA: Diagnosis not present

## 2021-02-22 DIAGNOSIS — F32A Depression, unspecified: Secondary | ICD-10-CM | POA: Diagnosis not present

## 2021-02-22 DIAGNOSIS — M961 Postlaminectomy syndrome, not elsewhere classified: Secondary | ICD-10-CM | POA: Diagnosis not present

## 2021-02-22 DIAGNOSIS — E039 Hypothyroidism, unspecified: Secondary | ICD-10-CM | POA: Diagnosis not present

## 2021-02-22 DIAGNOSIS — I10 Essential (primary) hypertension: Secondary | ICD-10-CM | POA: Diagnosis not present

## 2021-02-22 DIAGNOSIS — I129 Hypertensive chronic kidney disease with stage 1 through stage 4 chronic kidney disease, or unspecified chronic kidney disease: Secondary | ICD-10-CM | POA: Diagnosis not present

## 2021-02-22 NOTE — Progress Notes (Signed)
Spoke with pt for pre-op call. Pt cardiac history or Diabetes. Pt is treated for HTN. Pt's PCP is Dr. Gweneth Dimitri.  Covid test done today, result pending. Pt states she's been in quarantine since the test was done and understands that she stays in quarantine until she comes to the hospital on Monday.

## 2021-02-23 LAB — SARS CORONAVIRUS 2 (TAT 6-24 HRS): SARS Coronavirus 2: NEGATIVE

## 2021-02-24 NOTE — Anesthesia Preprocedure Evaluation (Addendum)
Anesthesia Evaluation  Patient identified by MRN, date of birth, ID band Patient awake    Reviewed: Allergy & Precautions, H&P , NPO status , Patient's Chart, lab work & pertinent test results  History of Anesthesia Complications (+) PONV  Airway Mallampati: III  TM Distance: >3 FB Neck ROM: Full    Dental no notable dental hx. (+) Teeth Intact, Dental Advisory Given   Pulmonary neg pulmonary ROS,    Pulmonary exam normal breath sounds clear to auscultation       Cardiovascular Exercise Tolerance: Good hypertension, Pt. on medications  Rhythm:Regular Rate:Normal     Neuro/Psych Anxiety Depression negative neurological ROS     GI/Hepatic Neg liver ROS, GERD  ,  Endo/Other  Hypothyroidism   Renal/GU Renal InsufficiencyRenal disease  negative genitourinary   Musculoskeletal  (+) Arthritis , Osteoarthritis,    Abdominal   Peds  Hematology negative hematology ROS (+)   Anesthesia Other Findings   Reproductive/Obstetrics negative OB ROS                            Anesthesia Physical Anesthesia Plan  ASA: II  Anesthesia Plan: General   Post-op Pain Management:    Induction: Intravenous  PONV Risk Score and Plan: 4 or greater and Ondansetron, Dexamethasone, Aprepitant and Midazolam  Airway Management Planned: Oral ETT  Additional Equipment:   Intra-op Plan:   Post-operative Plan: Extubation in OR  Informed Consent: I have reviewed the patients History and Physical, chart, labs and discussed the procedure including the risks, benefits and alternatives for the proposed anesthesia with the patient or authorized representative who has indicated his/her understanding and acceptance.     Dental advisory given  Plan Discussed with: CRNA  Anesthesia Plan Comments:        Anesthesia Quick Evaluation

## 2021-02-25 ENCOUNTER — Inpatient Hospital Stay (HOSPITAL_COMMUNITY): Payer: Medicare Other

## 2021-02-25 ENCOUNTER — Encounter (HOSPITAL_COMMUNITY): Payer: Self-pay | Admitting: Neurological Surgery

## 2021-02-25 ENCOUNTER — Other Ambulatory Visit: Payer: Self-pay

## 2021-02-25 ENCOUNTER — Inpatient Hospital Stay (HOSPITAL_COMMUNITY): Payer: Medicare Other | Admitting: Anesthesiology

## 2021-02-25 ENCOUNTER — Encounter (HOSPITAL_COMMUNITY)
Admission: RE | Disposition: A | Discharge status: Home / Self Care | Payer: Self-pay | Source: Home / Self Care | Attending: Neurological Surgery

## 2021-02-25 ENCOUNTER — Inpatient Hospital Stay (HOSPITAL_COMMUNITY)
Admission: RE | Admit: 2021-02-25 | Discharge: 2021-02-26 | DRG: 455 | Disposition: A | Discharge status: Home / Self Care | Payer: Medicare Other | Attending: Neurological Surgery | Admitting: Neurological Surgery

## 2021-02-25 DIAGNOSIS — Z20822 Contact with and (suspected) exposure to covid-19: Secondary | ICD-10-CM | POA: Diagnosis present

## 2021-02-25 DIAGNOSIS — Z79890 Hormone replacement therapy (postmenopausal): Secondary | ICD-10-CM

## 2021-02-25 DIAGNOSIS — N189 Chronic kidney disease, unspecified: Secondary | ICD-10-CM | POA: Diagnosis present

## 2021-02-25 DIAGNOSIS — Z79899 Other long term (current) drug therapy: Secondary | ICD-10-CM

## 2021-02-25 DIAGNOSIS — F32A Depression, unspecified: Secondary | ICD-10-CM | POA: Diagnosis present

## 2021-02-25 DIAGNOSIS — K219 Gastro-esophageal reflux disease without esophagitis: Secondary | ICD-10-CM | POA: Diagnosis present

## 2021-02-25 DIAGNOSIS — Z981 Arthrodesis status: Secondary | ICD-10-CM | POA: Diagnosis not present

## 2021-02-25 DIAGNOSIS — M48061 Spinal stenosis, lumbar region without neurogenic claudication: Secondary | ICD-10-CM | POA: Diagnosis present

## 2021-02-25 DIAGNOSIS — I129 Hypertensive chronic kidney disease with stage 1 through stage 4 chronic kidney disease, or unspecified chronic kidney disease: Secondary | ICD-10-CM | POA: Diagnosis present

## 2021-02-25 DIAGNOSIS — Z7982 Long term (current) use of aspirin: Secondary | ICD-10-CM | POA: Diagnosis not present

## 2021-02-25 DIAGNOSIS — E039 Hypothyroidism, unspecified: Secondary | ICD-10-CM | POA: Diagnosis present

## 2021-02-25 DIAGNOSIS — M532X6 Spinal instabilities, lumbar region: Secondary | ICD-10-CM | POA: Diagnosis not present

## 2021-02-25 DIAGNOSIS — N1831 Chronic kidney disease, stage 3a: Secondary | ICD-10-CM | POA: Diagnosis not present

## 2021-02-25 DIAGNOSIS — F419 Anxiety disorder, unspecified: Secondary | ICD-10-CM | POA: Diagnosis present

## 2021-02-25 DIAGNOSIS — Z885 Allergy status to narcotic agent status: Secondary | ICD-10-CM

## 2021-02-25 DIAGNOSIS — Z01818 Encounter for other preprocedural examination: Secondary | ICD-10-CM | POA: Diagnosis not present

## 2021-02-25 DIAGNOSIS — M961 Postlaminectomy syndrome, not elsewhere classified: Secondary | ICD-10-CM | POA: Diagnosis not present

## 2021-02-25 DIAGNOSIS — Z91048 Other nonmedicinal substance allergy status: Secondary | ICD-10-CM | POA: Diagnosis not present

## 2021-02-25 DIAGNOSIS — M47816 Spondylosis without myelopathy or radiculopathy, lumbar region: Secondary | ICD-10-CM | POA: Diagnosis not present

## 2021-02-25 DIAGNOSIS — G8929 Other chronic pain: Secondary | ICD-10-CM | POA: Diagnosis not present

## 2021-02-25 DIAGNOSIS — Z419 Encounter for procedure for purposes other than remedying health state, unspecified: Secondary | ICD-10-CM

## 2021-02-25 DIAGNOSIS — M4326 Fusion of spine, lumbar region: Secondary | ICD-10-CM | POA: Diagnosis not present

## 2021-02-25 DIAGNOSIS — I10 Essential (primary) hypertension: Secondary | ICD-10-CM | POA: Diagnosis not present

## 2021-02-25 HISTORY — DX: Essential (primary) hypertension: I10

## 2021-02-25 HISTORY — DX: Gastro-esophageal reflux disease without esophagitis: K21.9

## 2021-02-25 HISTORY — DX: Unspecified osteoarthritis, unspecified site: M19.90

## 2021-02-25 HISTORY — DX: Unspecified macular degeneration: H35.30

## 2021-02-25 HISTORY — DX: Depression, unspecified: F32.A

## 2021-02-25 HISTORY — DX: Other specified postprocedural states: Z98.890

## 2021-02-25 HISTORY — DX: Other specified postprocedural states: R11.2

## 2021-02-25 HISTORY — DX: Anxiety disorder, unspecified: F41.9

## 2021-02-25 HISTORY — DX: Hypothyroidism, unspecified: E03.9

## 2021-02-25 LAB — CBC WITH DIFFERENTIAL/PLATELET
Abs Immature Granulocytes: 0.02 10*3/uL (ref 0.00–0.07)
Basophils Absolute: 0 10*3/uL (ref 0.0–0.1)
Basophils Relative: 1 %
Eosinophils Absolute: 0.3 10*3/uL (ref 0.0–0.5)
Eosinophils Relative: 3 %
HCT: 40.9 % (ref 36.0–46.0)
Hemoglobin: 13.6 g/dL (ref 12.0–15.0)
Immature Granulocytes: 0 %
Lymphocytes Relative: 25 %
Lymphs Abs: 2.1 10*3/uL (ref 0.7–4.0)
MCH: 31 pg (ref 26.0–34.0)
MCHC: 33.3 g/dL (ref 30.0–36.0)
MCV: 93.2 fL (ref 80.0–100.0)
Monocytes Absolute: 1 10*3/uL (ref 0.1–1.0)
Monocytes Relative: 12 %
Neutro Abs: 5.1 10*3/uL (ref 1.7–7.7)
Neutrophils Relative %: 59 %
Platelets: 282 10*3/uL (ref 150–400)
RBC: 4.39 MIL/uL (ref 3.87–5.11)
RDW: 13.8 % (ref 11.5–15.5)
WBC: 8.6 10*3/uL (ref 4.0–10.5)
nRBC: 0 % (ref 0.0–0.2)

## 2021-02-25 LAB — BASIC METABOLIC PANEL
Anion gap: 9 (ref 5–15)
BUN: 28 mg/dL — ABNORMAL HIGH (ref 8–23)
CO2: 22 mmol/L (ref 22–32)
Calcium: 9.2 mg/dL (ref 8.9–10.3)
Chloride: 105 mmol/L (ref 98–111)
Creatinine, Ser: 1.09 mg/dL — ABNORMAL HIGH (ref 0.44–1.00)
GFR, Estimated: 54 mL/min — ABNORMAL LOW (ref >60)
Glucose, Bld: 100 mg/dL — ABNORMAL HIGH (ref 70–99)
Potassium: 3.8 mmol/L (ref 3.5–5.1)
Sodium: 136 mmol/L (ref 135–145)

## 2021-02-25 LAB — PROTIME-INR
INR: 1 (ref 0.8–1.2)
Prothrombin Time: 12.8 seconds (ref 11.4–15.2)

## 2021-02-25 LAB — ABO/RH: ABO/RH(D): O POS

## 2021-02-25 LAB — TYPE AND SCREEN
ABO/RH(D): O POS
Antibody Screen: NEGATIVE

## 2021-02-25 SURGERY — POSTERIOR LUMBAR FUSION 1 LEVEL
Anesthesia: General | Site: Back

## 2021-02-25 MED ORDER — APREPITANT 40 MG PO CAPS
40.0000 mg | ORAL_CAPSULE | ORAL | Status: AC
  Administered 2021-02-25: 40 mg via ORAL
  Filled 2021-02-25: qty 1

## 2021-02-25 MED ORDER — ASPIRIN 81 MG PO CHEW
81.0000 mg | CHEWABLE_TABLET | Freq: Every day | ORAL | Status: DC
  Administered 2021-02-25 – 2021-02-26 (×2): 81 mg via ORAL
  Filled 2021-02-25 (×2): qty 1

## 2021-02-25 MED ORDER — PROPOFOL 1000 MG/100ML IV EMUL
INTRAVENOUS | Status: AC
  Filled 2021-02-25: qty 100

## 2021-02-25 MED ORDER — ACETAMINOPHEN 500 MG PO TABS
1000.0000 mg | ORAL_TABLET | Freq: Once | ORAL | Status: AC
  Administered 2021-02-25: 1000 mg via ORAL
  Filled 2021-02-25: qty 2

## 2021-02-25 MED ORDER — ADULT MULTIVITAMIN W/MINERALS CH
1.0000 | ORAL_TABLET | Freq: Every day | ORAL | Status: DC
  Administered 2021-02-25 – 2021-02-26 (×2): 1 via ORAL
  Filled 2021-02-25 (×2): qty 1

## 2021-02-25 MED ORDER — ONDANSETRON HCL 4 MG/2ML IJ SOLN
INTRAMUSCULAR | Status: AC
  Filled 2021-02-25: qty 2

## 2021-02-25 MED ORDER — ALBUMIN HUMAN 5 % IV SOLN: INTRAVENOUS | Status: DC | PRN

## 2021-02-25 MED ORDER — DULOXETINE HCL 30 MG PO CPEP
60.0000 mg | ORAL_CAPSULE | Freq: Every day | ORAL | Status: DC
  Administered 2021-02-25: 60 mg via ORAL
  Filled 2021-02-25: qty 2

## 2021-02-25 MED ORDER — ONDANSETRON HCL 4 MG/2ML IJ SOLN
INTRAMUSCULAR | Status: DC | PRN
  Administered 2021-02-25: 4 mg via INTRAVENOUS

## 2021-02-25 MED ORDER — SCOPOLAMINE 1 MG/3DAYS TD PT72
MEDICATED_PATCH | TRANSDERMAL | Status: AC
  Filled 2021-02-25: qty 1

## 2021-02-25 MED ORDER — CHLORHEXIDINE GLUCONATE 0.12 % MT SOLN
OROMUCOSAL | Status: AC
  Administered 2021-02-25: 15 mL
  Filled 2021-02-25: qty 15

## 2021-02-25 MED ORDER — MIDAZOLAM HCL 2 MG/2ML IJ SOLN
INTRAMUSCULAR | Status: AC
  Filled 2021-02-25: qty 2

## 2021-02-25 MED ORDER — HYDROMORPHONE HCL 1 MG/ML IJ SOLN: 0.2500 mg | INTRAMUSCULAR | Status: DC | PRN

## 2021-02-25 MED ORDER — GABAPENTIN 300 MG PO CAPS
300.0000 mg | ORAL_CAPSULE | ORAL | Status: AC
  Administered 2021-02-25: 300 mg via ORAL
  Filled 2021-02-25: qty 1

## 2021-02-25 MED ORDER — SENNA 8.6 MG PO TABS
1.0000 | ORAL_TABLET | Freq: Two times a day (BID) | ORAL | Status: DC
  Administered 2021-02-25 – 2021-02-26 (×2): 8.6 mg via ORAL
  Filled 2021-02-25 (×2): qty 1

## 2021-02-25 MED ORDER — CEFAZOLIN SODIUM-DEXTROSE 2-4 GM/100ML-% IV SOLN
2.0000 g | Freq: Three times a day (TID) | INTRAVENOUS | Status: AC
  Administered 2021-02-25 – 2021-02-26 (×2): 2 g via INTRAVENOUS
  Filled 2021-02-25 (×2): qty 100

## 2021-02-25 MED ORDER — MENTHOL 3 MG MT LOZG: 1.0000 | LOZENGE | OROMUCOSAL | Status: DC | PRN

## 2021-02-25 MED ORDER — SUGAMMADEX SODIUM 200 MG/2ML IV SOLN
INTRAVENOUS | Status: DC | PRN
  Administered 2021-02-25: 200 mg via INTRAVENOUS

## 2021-02-25 MED ORDER — ONDANSETRON HCL 4 MG/2ML IJ SOLN: 4.0000 mg | Freq: Four times a day (QID) | INTRAMUSCULAR | Status: DC | PRN

## 2021-02-25 MED ORDER — HEPARIN SODIUM (PORCINE) 1000 UNIT/ML IJ SOLN
INTRAMUSCULAR | Status: AC
  Filled 2021-02-25: qty 1

## 2021-02-25 MED ORDER — CHLORHEXIDINE GLUCONATE CLOTH 2 % EX PADS: 6.0000 | MEDICATED_PAD | Freq: Once | CUTANEOUS | Status: DC

## 2021-02-25 MED ORDER — LACTATED RINGERS IV SOLN: INTRAVENOUS | Status: DC | PRN

## 2021-02-25 MED ORDER — CYCLOBENZAPRINE HCL 10 MG PO TABS
10.0000 mg | ORAL_TABLET | Freq: Three times a day (TID) | ORAL | Status: DC | PRN
  Administered 2021-02-25: 10 mg via ORAL
  Filled 2021-02-25: qty 1

## 2021-02-25 MED ORDER — MIDAZOLAM HCL 5 MG/5ML IJ SOLN
INTRAMUSCULAR | Status: DC | PRN
  Administered 2021-02-25: 1 mg via INTRAVENOUS

## 2021-02-25 MED ORDER — ACETAMINOPHEN 325 MG PO TABS
650.0000 mg | ORAL_TABLET | ORAL | Status: DC | PRN
  Administered 2021-02-25 – 2021-02-26 (×2): 650 mg via ORAL
  Filled 2021-02-25 (×2): qty 2

## 2021-02-25 MED ORDER — SCOPOLAMINE 1 MG/3DAYS TD PT72
MEDICATED_PATCH | TRANSDERMAL | Status: DC | PRN
  Administered 2021-02-25: 1 via TRANSDERMAL

## 2021-02-25 MED ORDER — PROPOFOL 10 MG/ML IV BOLUS
INTRAVENOUS | Status: DC | PRN
  Administered 2021-02-25: 100 mg via INTRAVENOUS

## 2021-02-25 MED ORDER — PHENYLEPHRINE HCL-NACL 10-0.9 MG/250ML-% IV SOLN
INTRAVENOUS | Status: DC | PRN
  Administered 2021-02-25: 25 ug/min via INTRAVENOUS

## 2021-02-25 MED ORDER — ONDANSETRON HCL 4 MG PO TABS: 4.0000 mg | ORAL_TABLET | Freq: Four times a day (QID) | ORAL | Status: DC | PRN

## 2021-02-25 MED ORDER — ACETAMINOPHEN 650 MG RE SUPP: 650.0000 mg | RECTAL | Status: DC | PRN

## 2021-02-25 MED ORDER — THROMBIN 5000 UNITS EX SOLR
OROMUCOSAL | Status: DC | PRN
  Administered 2021-02-25: 5 mL via TOPICAL

## 2021-02-25 MED ORDER — SODIUM CHLORIDE 0.9 % IV SOLN
250.0000 mL | INTRAVENOUS | Status: DC
  Administered 2021-02-25: 250 mL via INTRAVENOUS

## 2021-02-25 MED ORDER — CELECOXIB 200 MG PO CAPS
200.0000 mg | ORAL_CAPSULE | Freq: Two times a day (BID) | ORAL | Status: DC
  Administered 2021-02-25 – 2021-02-26 (×2): 200 mg via ORAL
  Filled 2021-02-25 (×2): qty 1

## 2021-02-25 MED ORDER — POTASSIUM CHLORIDE IN NACL 20-0.9 MEQ/L-% IV SOLN: INTRAVENOUS | Status: DC

## 2021-02-25 MED ORDER — FENTANYL CITRATE (PF) 250 MCG/5ML IJ SOLN
INTRAMUSCULAR | Status: AC
  Filled 2021-02-25: qty 5

## 2021-02-25 MED ORDER — DEXAMETHASONE SODIUM PHOSPHATE 10 MG/ML IJ SOLN
INTRAMUSCULAR | Status: DC | PRN
  Administered 2021-02-25: 8 mg via INTRAVENOUS

## 2021-02-25 MED ORDER — LISINOPRIL 20 MG PO TABS
20.0000 mg | ORAL_TABLET | Freq: Every day | ORAL | Status: DC
  Administered 2021-02-25: 20 mg via ORAL
  Filled 2021-02-25: qty 1

## 2021-02-25 MED ORDER — PROPOFOL 10 MG/ML IV BOLUS
INTRAVENOUS | Status: AC
  Filled 2021-02-25: qty 20

## 2021-02-25 MED ORDER — SODIUM CHLORIDE 0.9% FLUSH: 3.0000 mL | INTRAVENOUS | Status: DC | PRN

## 2021-02-25 MED ORDER — DEXAMETHASONE SODIUM PHOSPHATE 10 MG/ML IJ SOLN
INTRAMUSCULAR | Status: AC
  Filled 2021-02-25: qty 1

## 2021-02-25 MED ORDER — HYDROXYZINE HCL 50 MG/ML IM SOLN
50.0000 mg | Freq: Four times a day (QID) | INTRAMUSCULAR | Status: DC | PRN
Start: 2021-02-25 — End: 2021-02-26
  Administered 2021-02-25: 50 mg via INTRAMUSCULAR
  Filled 2021-02-25: qty 1

## 2021-02-25 MED ORDER — THROMBIN 5000 UNITS EX SOLR
CUTANEOUS | Status: AC
  Filled 2021-02-25: qty 5000

## 2021-02-25 MED ORDER — LIDOCAINE 2% (20 MG/ML) 5 ML SYRINGE
INTRAMUSCULAR | Status: AC
  Filled 2021-02-25: qty 5

## 2021-02-25 MED ORDER — CEFAZOLIN SODIUM-DEXTROSE 2-4 GM/100ML-% IV SOLN
2.0000 g | INTRAVENOUS | Status: AC
  Administered 2021-02-25: 2 g via INTRAVENOUS
  Filled 2021-02-25: qty 100

## 2021-02-25 MED ORDER — BUPIVACAINE HCL (PF) 0.25 % IJ SOLN
INTRAMUSCULAR | Status: AC
  Filled 2021-02-25: qty 30

## 2021-02-25 MED ORDER — BUPIVACAINE HCL (PF) 0.25 % IJ SOLN
INTRAMUSCULAR | Status: DC | PRN
  Administered 2021-02-25: 6 mL

## 2021-02-25 MED ORDER — SODIUM CHLORIDE 0.9% FLUSH
3.0000 mL | Freq: Two times a day (BID) | INTRAVENOUS | Status: DC
  Administered 2021-02-25: 3 mL via INTRAVENOUS

## 2021-02-25 MED ORDER — LIDOCAINE 2% (20 MG/ML) 5 ML SYRINGE
INTRAMUSCULAR | Status: DC | PRN
  Administered 2021-02-25: 60 mg via INTRAVENOUS

## 2021-02-25 MED ORDER — FENTANYL CITRATE (PF) 250 MCG/5ML IJ SOLN
INTRAMUSCULAR | Status: DC | PRN
  Administered 2021-02-25: 50 ug via INTRAVENOUS
  Administered 2021-02-25: 100 ug via INTRAVENOUS
  Administered 2021-02-25: 50 ug via INTRAVENOUS

## 2021-02-25 MED ORDER — OXYCODONE HCL 5 MG PO TABS
5.0000 mg | ORAL_TABLET | ORAL | Status: DC | PRN
  Administered 2021-02-25 – 2021-02-26 (×6): 5 mg via ORAL
  Filled 2021-02-25 (×6): qty 1

## 2021-02-25 MED ORDER — LEVOTHYROXINE SODIUM 75 MCG PO TABS
75.0000 ug | ORAL_TABLET | Freq: Every day | ORAL | Status: DC
  Administered 2021-02-26: 75 ug via ORAL
  Filled 2021-02-25: qty 1

## 2021-02-25 MED ORDER — PHENOL 1.4 % MT LIQD: 1.0000 | OROMUCOSAL | Status: DC | PRN

## 2021-02-25 MED ORDER — MORPHINE SULFATE (PF) 2 MG/ML IV SOLN: 2.0000 mg | INTRAVENOUS | Status: DC | PRN

## 2021-02-25 MED ORDER — THROMBIN 20000 UNITS EX SOLR
CUTANEOUS | Status: AC
  Filled 2021-02-25: qty 20000

## 2021-02-25 MED ORDER — THROMBIN 20000 UNITS EX SOLR
CUTANEOUS | Status: DC | PRN
  Administered 2021-02-25: 20 mL via TOPICAL

## 2021-02-25 MED ORDER — 0.9 % SODIUM CHLORIDE (POUR BTL) OPTIME
TOPICAL | Status: DC | PRN
  Administered 2021-02-25: 1000 mL

## 2021-02-25 MED ORDER — HYDROMORPHONE HCL 1 MG/ML IJ SOLN
0.5000 mg | INTRAMUSCULAR | Status: DC | PRN
Start: 2021-02-25 — End: 2021-02-26

## 2021-02-25 MED ORDER — PROPOFOL 500 MG/50ML IV EMUL
INTRAVENOUS | Status: DC | PRN
  Administered 2021-02-25: 25 ug/kg/min via INTRAVENOUS

## 2021-02-25 MED ORDER — DEXAMETHASONE 4 MG PO TABS
4.0000 mg | ORAL_TABLET | Freq: Four times a day (QID) | ORAL | Status: DC
  Administered 2021-02-26: 4 mg via ORAL
  Filled 2021-02-25: qty 1

## 2021-02-25 MED ORDER — DEXAMETHASONE SODIUM PHOSPHATE 4 MG/ML IJ SOLN
4.0000 mg | Freq: Four times a day (QID) | INTRAMUSCULAR | Status: DC
  Administered 2021-02-25 – 2021-02-26 (×3): 4 mg via INTRAVENOUS
  Filled 2021-02-25 (×3): qty 1

## 2021-02-25 MED ORDER — ROCURONIUM BROMIDE 10 MG/ML (PF) SYRINGE
PREFILLED_SYRINGE | INTRAVENOUS | Status: DC | PRN
  Administered 2021-02-25: 10 mg via INTRAVENOUS
  Administered 2021-02-25: 50 mg via INTRAVENOUS
  Administered 2021-02-25: 10 mg via INTRAVENOUS

## 2021-02-25 MED ORDER — ROCURONIUM BROMIDE 10 MG/ML (PF) SYRINGE
PREFILLED_SYRINGE | INTRAVENOUS | Status: AC
  Filled 2021-02-25: qty 10

## 2021-02-25 SURGICAL SUPPLY — 54 items
BASKET BONE COLLECTION (BASKET) ×2 IMPLANT
BENZOIN TINCTURE PRP APPL 2/3 (GAUZE/BANDAGES/DRESSINGS) ×2 IMPLANT
BLADE CLIPPER SURG (BLADE) IMPLANT
BONE MATRIX OSTEOCEL PRO MED (Bone Implant) ×2 IMPLANT
BUR CARBIDE MATCH 3.0 (BURR) ×2 IMPLANT
CANISTER SUCT 3000ML PPV (MISCELLANEOUS) ×2 IMPLANT
CNTNR URN SCR LID CUP LEK RST (MISCELLANEOUS) ×1 IMPLANT
CONT SPEC 4OZ STRL OR WHT (MISCELLANEOUS) ×2
COVER BACK TABLE 60X90IN (DRAPES) ×2 IMPLANT
COVER WAND RF STERILE (DRAPES) IMPLANT
DERMABOND ADVANCED (GAUZE/BANDAGES/DRESSINGS) ×1
DERMABOND ADVANCED .7 DNX12 (GAUZE/BANDAGES/DRESSINGS) ×1 IMPLANT
DIFFUSER DRILL AIR PNEUMATIC (MISCELLANEOUS) IMPLANT
DRAPE C-ARM 42X72 X-RAY (DRAPES) ×6 IMPLANT
DRAPE LAPAROTOMY 100X72X124 (DRAPES) ×2 IMPLANT
DRAPE SURG 17X23 STRL (DRAPES) ×2 IMPLANT
DRSG OPSITE POSTOP 4X6 (GAUZE/BANDAGES/DRESSINGS) ×2 IMPLANT
DURAPREP 26ML APPLICATOR (WOUND CARE) ×2 IMPLANT
ELECT REM PT RETURN 9FT ADLT (ELECTROSURGICAL) ×2
ELECTRODE REM PT RTRN 9FT ADLT (ELECTROSURGICAL) ×1 IMPLANT
EVACUATOR 1/8 PVC DRAIN (DRAIN) ×2 IMPLANT
GAUZE 4X4 16PLY RFD (DISPOSABLE) IMPLANT
GLOVE BIO SURGEON STRL SZ7 (GLOVE) IMPLANT
GLOVE BIO SURGEON STRL SZ8 (GLOVE) ×4 IMPLANT
GLOVE SURG UNDER POLY LF SZ7 (GLOVE) IMPLANT
GOWN STRL REUS W/ TWL LRG LVL3 (GOWN DISPOSABLE) IMPLANT
GOWN STRL REUS W/ TWL XL LVL3 (GOWN DISPOSABLE) ×2 IMPLANT
GOWN STRL REUS W/TWL 2XL LVL3 (GOWN DISPOSABLE) IMPLANT
GOWN STRL REUS W/TWL LRG LVL3 (GOWN DISPOSABLE)
GOWN STRL REUS W/TWL XL LVL3 (GOWN DISPOSABLE) ×4
HEMOSTAT POWDER KIT SURGIFOAM (HEMOSTASIS) IMPLANT
KIT BASIN OR (CUSTOM PROCEDURE TRAY) ×2 IMPLANT
KIT BONE MRW ASP ANGEL CPRP (KITS) IMPLANT
KIT TURNOVER KIT B (KITS) ×2 IMPLANT
MILL MEDIUM DISP (BLADE) ×2 IMPLANT
NEEDLE HYPO 25X1 1.5 SAFETY (NEEDLE) ×2 IMPLANT
NS IRRIG 1000ML POUR BTL (IV SOLUTION) ×2 IMPLANT
PACK LAMINECTOMY NEURO (CUSTOM PROCEDURE TRAY) ×2 IMPLANT
PAD ARMBOARD 7.5X6 YLW CONV (MISCELLANEOUS) ×6 IMPLANT
ROD RELINE TI LORD 5.5X70 (Rod) ×4 IMPLANT
SCREW LOCK RELINE 5.5 TULIP (Screw) ×12 IMPLANT
SCREW RELINE-O POLY 6.5X45 (Screw) ×4 IMPLANT
SPACER PS IDENTITI 7X9X25 0D (Spacer) ×4 IMPLANT
SPONGE LAP 4X18 RFD (DISPOSABLE) IMPLANT
SPONGE SURGIFOAM ABS GEL 100 (HEMOSTASIS) ×2 IMPLANT
STRIP CLOSURE SKIN 1/2X4 (GAUZE/BANDAGES/DRESSINGS) ×2 IMPLANT
SUT VIC AB 0 CT1 18XCR BRD8 (SUTURE) ×1 IMPLANT
SUT VIC AB 0 CT1 8-18 (SUTURE) ×2
SUT VIC AB 2-0 CP2 18 (SUTURE) ×4 IMPLANT
SUT VIC AB 3-0 SH 8-18 (SUTURE) ×4 IMPLANT
TOWEL GREEN STERILE (TOWEL DISPOSABLE) ×2 IMPLANT
TOWEL GREEN STERILE FF (TOWEL DISPOSABLE) ×2 IMPLANT
TRAY FOLEY MTR SLVR 16FR STAT (SET/KITS/TRAYS/PACK) ×2 IMPLANT
WATER STERILE IRR 1000ML POUR (IV SOLUTION) ×2 IMPLANT

## 2021-02-25 NOTE — Anesthesia Postprocedure Evaluation (Signed)
Anesthesia Post Note  Patient: Zanae Kuehnle  Procedure(s) Performed: Posterior Lumbar Interbody Fusion - Lumbar One- Lumbar Two - Extension of hardware (N/A Back)     Patient location during evaluation: PACU Anesthesia Type: General Level of consciousness: awake and alert Pain management: pain level controlled Vital Signs Assessment: post-procedure vital signs reviewed and stable Respiratory status: spontaneous breathing, nonlabored ventilation, respiratory function stable and patient connected to nasal cannula oxygen Cardiovascular status: blood pressure returned to baseline and stable Postop Assessment: no apparent nausea or vomiting Anesthetic complications: no   No complications documented.  Last Vitals:  Vitals:   02/25/21 1345 02/25/21 1400  BP: 139/84 133/87  Pulse: 76 75  Resp: 20 17  Temp:    SpO2: 96% 96%    Last Pain:  Vitals:   02/25/21 1400  TempSrc:   PainSc: 0-No pain                 Kareena Arrambide,W. EDMOND

## 2021-02-25 NOTE — Op Note (Signed)
02/25/2021  12:47 PM  PATIENT:  [name]  75 y.o. female  PRE-OPERATIVE DIAGNOSIS: Adjacent level stenosis L1-2, back and leg pain  POST-OPERATIVE DIAGNOSIS:  same  PROCEDURE:   1. Decompressive lumbar laminectomy hemifacetectomy foraminotomies L1-2 requiring more work than would be required for a simple exposure of the disk for PLIF in order to adequately decompress the neural elements and address the spinal stenosis 2. Posterior lumbar interbody fusion L1-2 using PTI interbody cages packed with morcellized allograft and autograft  3. Posterior fixation L1-3 using [employer] pedicle screws.  4. Intertransverse arthrodesis L1-2 using morcellized autograft and allograft. 5.  Removal of segmental instrumentation L2-4  SURGEON:  Marikay Alar, MD  ASSISTANTS: Adelene Idler FNP  ANESTHESIA:  General  EBL: 250 ml  Total I/O In: 1750 [I.V.:1400; IV Piggyback:350] Out: 370 [Urine:120; Blood:250]  BLOOD ADMINISTERED:none  DRAINS: none   INDICATION FOR PROCEDURE: This patient presented with back and bilateral leg pain after previous L2-S1 fusion by another surgeon. Imaging revealed severe adjacent level stenosis L1-2. The patient tried a reasonable attempt at conservative medical measures without relief. I recommended decompression and instrumented fusion to address the stenosis as well as the segmental  instability.  Patient understood the risks, benefits, and alternatives and potential outcomes and wished to proceed.  PROCEDURE DETAILS:  The patient was brought to the operating room. After induction of generalized endotracheal anesthesia the patient was rolled into the prone position on chest rolls and all pressure points were padded. The patient's lumbar region was cleaned and then prepped with DuraPrep and draped in the usual sterile fashion. Anesthesia was injected and then a dorsal midline incision was made and carried down to the lumbosacral fascia. The fascia was opened and the  paraspinous musculature was taken down in a subperiosteal fashion to expose L1-2 as well as the previously placed instrumentation. A self-retaining retractor was placed.  We cut the rod with a metal cutting bur inferior to the L3 screws.  We remove the locking caps from the L2 in the L3 screws bilaterally and remove the rods after they have been cut.  All 4 screws had good purchase.  Intraoperative fluoroscopy confirmed my level.  I then turned my attention to the decompression and complete lumbar laminectomies, hemi- facetectomies, and foraminotomies were performed at L1-2.  My nurse practitioner was directly involved in the decompression and exposure of the neural elements. the patient had significant spinal stenosis and this required more work than would be required for a simple exposure of the disc for posterior lumbar interbody fusion which would only require a limited laminotomy. Much more generous decompression and generous foraminotomy was undertaken in order to adequately decompress the neural elements and address the patient's leg pain. The yellow ligament was removed to expose the underlying dura and nerve roots, and generous foraminotomies were performed to adequately decompress the neural elements. Both the exiting and traversing nerve roots were decompressed on both sides until a coronary dilator passed easily along the nerve roots. Once the decompression was complete, I turned my attention to the posterior lower lumbar interbody fusion. The epidural venous vasculature was coagulated and cut sharply. Disc space was incised and the initial discectomy was performed with pituitary rongeurs. The disc space was distracted with sequential distractors to a height of 7 mm. We then used a series of scrapers and shavers to prepare the endplates for fusion. The midline was prepared with Epstein curettes. Once the complete discectomy was finished, we packed an appropriate sized interbody cage with  local autograft  and morcellized allograft, gently retracted the nerve root, and tapped the cage into position at L1-2.  The midline between the cages was packed with morselized autograft and allograft. We then turned our attention to the placement of the L1 pedicle screws. The pedicle screw entry zones were identified utilizing surface landmarks and fluoroscopy. I drilled into each pedicle utilizing the hand drill, probed each pedicle with the pedicle probe, and tapped each pedicle with the appropriate tap. We palpated with a ball probe to assure no break in the cortex. We then placed 6.5 x 45 mm pedicle screw into the pedicles bilaterally at L1.  My nurse practitioner assisted in placement of the pedicle screws.  We then decorticated the transverse processes and laid a mixture of morcellized autograft and allograft out over these to perform intertransverse arthrodesis at L1-2. We then placed lordotic rods into the multiaxial screw heads of the pedicle screws and locked these in position with the locking caps and anti-torque device. We then checked our construct with AP and lateral fluoroscopy. Irrigated with copious amounts of bacitracin-containing saline solution. Inspected the nerve roots once again to assure adequate decompression, lined to the dura with Gelfoam, placed powdered vancomycin into the wound, and then we closed the muscle and the fascia with 0 Vicryl. Closed the subcutaneous tissues with 2-0 Vicryl and subcuticular tissues with 3-0 Vicryl. The skin was closed with benzoin and Steri-Strips. Dressing was then applied, the patient was awakened from general anesthesia and transported to the recovery room in stable condition. At the end of the procedure all sponge, needle and instrument counts were correct.   PLAN OF CARE: admit to inpatient  PATIENT DISPOSITION:  PACU - hemodynamically stable.   Delay start of Pharmacological VTE agent (>24hrs) due to surgical blood loss or risk of bleeding:  yes

## 2021-02-25 NOTE — H&P (Signed)
Subjective: Patient is a 75 y.o. female admitted for back and leg pain. Onset of symptoms was several months ago, gradually worsening since that time.  The pain is rated severe, and is located at the across the lower back and radiates to legs. The pain is described as aching and occurs all day. The symptoms have been progressive. Symptoms are exacerbated by exercise and standing. MRI or CT showed stenosis   Past Medical History:  Diagnosis Date  . Anxiety   . Arthritis   . Chronic kidney disease 2019   ARF while in hospital for PLIF  . Depression   . GERD (gastroesophageal reflux disease)   . Hypertension   . Hypothyroidism   . Macular degeneration of both eyes    not age related  . PONV (postoperative nausea and vomiting)    also is slow to wake up    Past Surgical History:  Procedure Laterality Date  . ABDOMINAL HYSTERECTOMY     total  . ANTERIOR CERVICAL DISCECTOMY    . APPENDECTOMY    . BACK SURGERY     PLIF  . BUNIONECTOMY Right   . CHOLECYSTECTOMY    . COLONOSCOPY    . HAND SURGERY Left    thumb joint, carpal tunnel and cyst  . HAND SURGERY Right    thumb and carpal tunnel  . POSTERIOR CERVICAL FUSION/FORAMINOTOMY      Prior to Admission medications   Medication Sig Start Date End Date Taking? Authorizing Provider  acetaminophen (TYLENOL) 650 MG CR tablet Take 650 mg by mouth every 8 (eight) hours as needed for pain.   Yes [provider]  aspirin 81 MG chewable tablet Chew 81 mg by mouth daily.   Yes [provider]  DULoxetine (CYMBALTA) 60 MG capsule Take 60 mg by mouth at bedtime. 01/04/21  Yes [provider]  levothyroxine (SYNTHROID) 75 MCG tablet Take 75 mcg by mouth daily before breakfast. 01/04/21  Yes [provider]  lisinopril (ZESTRIL) 20 MG tablet Take 20 mg by mouth at bedtime. 01/04/21  Yes [provider]  Multiple Vitamin (MULTIVITAMIN WITH MINERALS) TABS tablet Take 1 tablet by mouth daily.   Yes [provider]  traMADol (ULTRAM) 50 MG tablet Take 50 mg by mouth 2 (two) times daily as needed for pain. 01/04/21  Yes [provider]  ALPRAZolam Prudy Feeler) 0.5 MG tablet Take 0.25 mg by mouth at bedtime as needed for sleep. 01/04/21   [provider]  cyclobenzaprine (FLEXERIL) 10 MG tablet Take 10 mg by mouth 3 (three) times daily as needed for muscle spasms. 01/04/21   [provider]   Allergies  Allergen Reactions  . Codeine Nausea Only    Severe headache  . Morphine And Related Nausea And Vomiting    And headaches  . Tape Itching    Social History   Tobacco Use  . Smoking status: Never Smoker  . Smokeless tobacco: Never Used  Substance Use Topics  . Alcohol use: Never    Family History  Problem Relation Age of Onset  . Breast cancer Neg Hx      Review of Systems  Positive ROS: neg  All other systems have been reviewed and were otherwise negative with the exception of those mentioned in the HPI and as above.  Objective: Vital signs in last 24 hours: Temp:  [98.3 F (36.8 C)] 98.3 F (36.8 C) (04/11 0658) Pulse Rate:  [103] 103 (04/11 0658) Resp:  [18] 18 (04/11 9485)  BP: (141)/(73) 141/73 (04/11 0658) SpO2:  [97 %] 97 % (04/11 0658) Weight:  [84.8 kg] 84.8 kg (04/11 0658)  General Appearance: Alert, cooperative, no distress, appears stated age Head: Normocephalic, without obvious abnormality, atraumatic Eyes: PERRL, conjunctiva/corneas clear, EOM's intact    Neck: Supple, symmetrical, trachea midline Back: Symmetric, no curvature, ROM normal, no CVA tenderness Lungs:  respirations unlabored Heart: Regular rate and rhythm Abdomen: Soft, non-tender Extremities: Extremities normal, atraumatic, no cyanosis or edema Pulses: 2+ and symmetric all extremities Skin: Skin color, texture, turgor normal, no rashes or lesions  NEUROLOGIC:   Mental status: Alert and oriented x4,  no aphasia, good attention span, fund of knowledge, and  memory Motor Exam - grossly normal Sensory Exam - grossly normal Reflexes: 1= Coordination - grossly normal Gait - grossly normal Balance - grossly normal Cranial Nerves: I: smell Not tested  II: visual acuity  OS: nl    OD: nl  II: visual fields Full to confrontation  II: pupils Equal, round, reactive to light  III,VII: ptosis None  III,IV,VI: extraocular muscles  Full ROM  V: mastication Normal  V: facial light touch sensation  Normal  V,VII: corneal reflex  Present  VII: facial muscle function - upper  Normal  VII: facial muscle function - lower Normal  VIII: hearing Not tested  IX: soft palate elevation  Normal  IX,X: gag reflex Present  XI: trapezius strength  5/5  XI: sternocleidomastoid strength 5/5  XI: neck flexion strength  5/5  XII: tongue strength  Normal    Data Review Lab Results  Component Value Date   WBC 8.6 02/25/2021   HGB 13.6 02/25/2021   HCT 40.9 02/25/2021   MCV 93.2 02/25/2021   PLT 282 02/25/2021   Lab Results  Component Value Date   NA 136 02/25/2021   K 3.8 02/25/2021   CL 105 02/25/2021   CO2 22 02/25/2021   BUN 28 (H) 02/25/2021   CREATININE 1.09 (H) 02/25/2021   GLUCOSE 100 (H) 02/25/2021   Lab Results  Component Value Date   INR 1.0 02/25/2021    Assessment/Plan:  Estimated body mass index is 31.12 kg/m as calculated from the following:   Height as of this encounter: 5\' 5"  (1.651 m).   Weight as of this encounter: 84.8 kg. Patient admitted for PLIF L1-2. Patient has failed a reasonable attempt at conservative therapy.  I explained the condition and procedure to the patient and answered any questions.  Patient wishes to proceed with procedure as planned. Understands risks/ benefits and typical outcomes of procedure.   02/25/2021 8:47 AM

## 2021-02-25 NOTE — Anesthesia Procedure Notes (Signed)
Procedure Name: Intubation Date/Time: 02/25/2021 9:35 AM Performed by: Marena Chancy, CRNA Pre-anesthesia Checklist: Patient identified, Emergency Drugs available, Suction available and Patient being monitored Patient Re-evaluated:Patient Re-evaluated prior to induction Oxygen Delivery Method: Circle System Utilized Preoxygenation: Pre-oxygenation with 100% oxygen Induction Type: IV induction Ventilation: Mask ventilation without difficulty Laryngoscope Size: Miller and 2 Grade View: Grade I Tube type: Oral Tube size: 7.0 mm Number of attempts: 1 Airway Equipment and Method: Stylet and Oral airway Placement Confirmation: ETT inserted through vocal cords under direct vision,  positive ETCO2 and breath sounds checked- equal and bilateral Tube secured with: Tape Dental Injury: Teeth and Oropharynx as per pre-operative assessment

## 2021-02-25 NOTE — Transfer of Care (Signed)
Immediate Anesthesia Transfer of Care Note  Patient: [name]  Procedure(s) Performed: Posterior Lumbar Interbody Fusion - Lumbar One- Lumbar Two - Extension of hardware (N/A Back)  Patient Location: PACU  Anesthesia Type:General  Level of Consciousness: awake, alert  and oriented  Airway & Oxygen Therapy: Patient Spontanous Breathing and Patient connected to nasal cannula oxygen  Post-op Assessment: Report given to RN, Post -op Vital signs reviewed and stable and Patient moving all extremities X 4  Post vital signs: Reviewed and stable  Last Vitals:  Vitals Value Taken Time  BP 142/74 02/25/21 1258  Temp    Pulse 77 02/25/21 1302  Resp 21 02/25/21 1302  SpO2 96 % 02/25/21 1302  Vitals shown include unvalidated device data.  Last Pain:  Vitals:   02/25/21 0722  TempSrc:   PainSc: 5          Complications: No complications documented.

## 2021-02-26 MED ORDER — OXYCODONE-ACETAMINOPHEN 5-325 MG PO TABS: 1.0000 | ORAL_TABLET | ORAL | 0 refills | Status: DC | PRN

## 2021-02-26 MED ORDER — CYCLOBENZAPRINE HCL 10 MG PO TABS: 10.0000 mg | ORAL_TABLET | Freq: Three times a day (TID) | ORAL | 0 refills | Status: DC | PRN

## 2021-02-26 NOTE — Progress Notes (Addendum)
Occupational Therapy Evaluation Patient Details Name: [name] MRN: [id number] DOB: [birth year] Today's Date: 02/26/2021    History of Present Illness Patient is a 75 y/o female who presents s/p L1-2 PLIF on 02/25/21. PMH significant for macular degeneration B eyes, hypothyroidism, HTN, CKD, ACDF, posterior cervical fusion.   Clinical Impression   [name] reports being mod I prior to admission, she intermittently used a cane for ambulation and AE for ADLs. She lives in a home with her husband who has dementia and her grandchildren who are able to provide support. Pt demonstrated supervision/min guard level of ADLs and functional mobility this session and was receptive to all education in regards to her back precautions and back brace application. [name] does not require continued OT services, and currently owns all DME/AE necessary for safe d/c home. D/c home with family support and intermittent supervision is appropriate.     Follow Up Recommendations  No OT follow up    Equipment Recommendations  Other (comment) (Pt currently owns all necessary AE/DME needed for safe d/c home)    Recommendations for Other Services       Precautions / Restrictions Precautions Precautions: Fall;Back Precaution Booklet Issued: Yes (comment) Precaution Comments: educated pt on back hand out Required Braces or Orthoses: Spinal Brace Spinal Brace: Lumbar corset Restrictions Weight Bearing Restrictions: No      Mobility Bed Mobility Overal bed mobility: Modified Independent             General bed mobility comments: Pt was received sitting up in the recliner. Reviewed log roll technique verbally.    Transfers Overall transfer level: Modified independent Equipment used: Rolling walker (2 wheeled);Straight cane             General transfer comment: Increased time but no assist required. SPC initially and RW at end of session as pt feels more comfortable with RW for ambulation.     Balance Overall balance assessment: Needs assistance Sitting-balance support: Feet supported;No upper extremity supported Sitting balance-Leahy Scale: Fair     Standing balance support: Single extremity supported;During functional activity Standing balance-Leahy Scale: Fair Standing balance comment: No overt LOB without UE support however appears mildly unsteady.         ADL either performed or assessed with clinical judgement   ADL Overall ADL's : Needs assistance/impaired Eating/Feeding: Modified independent;Cueing for safety;Cueing for compensatory techinques   Grooming: Wash/dry face;Wash/dry hands;Oral care;Applying deodorant;Brushing hair;Cueing for safety;Cueing for compensatory techniques;Standing;Supervision/safety   Upper Body Bathing: With adaptive equipment;Cueing for compensatory techniques;Sitting;Min guard   Lower Body Bathing: With adaptive equipment;Sitting/lateral leans;Adhering to back precautions;Min guard   Upper Body Dressing : Cueing for compensatory techniques;Sitting;Supervision/safety   Lower Body Dressing: Cueing for compensatory techniques;Adhering to back precautions;Sit to/from stand;Min guard   Toilet Transfer: Comfort height toilet;Min guard   Toileting- Clothing Manipulation and Hygiene: With adaptive equipment;Cueing for compensatory techniques;Adhering to back precautions;Supervision/safety   Tub/ Shower Transfer: Adhering to back precautions;Shower seat;Grab bars;Min guard   Functional mobility during ADLs: Cueing for safety;Cueing for sequencing;Min guard General ADL Comments: Pt educated on back precautions and back brace, pt required vc throughout to adhere to precautions. Pt educated on compensatory strategies for ADLs. Pt also educated on importance of having help to care for new puppy, and the fall risk it creates. pt demonstrated and verbalized understanding of all education.     Vision Baseline Vision/History: Macular  Degeneration Patient Visual Report: No change from baseline  Pertinent Vitals/Pain Pain Assessment: No/denies pain     Hand Dominance Right   Extremity/Trunk Assessment Upper Extremity Assessment Upper Extremity Assessment: Defer to OT evaluation   Lower Extremity Assessment Lower Extremity Assessment: Generalized weakness (Consistent with pre-op diagnosis)   Cervical / Trunk Assessment Cervical / Trunk Assessment: Other exceptions Cervical / Trunk Exceptions: s/p surgery   Communication Communication Communication: No difficulties   Cognition Arousal/Alertness: Awake/alert Behavior During Therapy: WFL for tasks assessed/performed Overall Cognitive Status: Within Functional Limits for tasks assessed                 General Comments  Pt with several skin tears of BUE, pt reports this is due to her puppy jumping on her            Home Living Family/patient expects to be discharged to:: Private residence Living Arrangements: Other relatives;Spouse/significant other Available Help at Discharge: Family Type of Home: House Home Access: Level entry     Home Layout: One level     Bathroom Shower/Tub: Producer, television/film/video: Handicapped height     Home Equipment: Environmental consultant - 2 wheels;Cane - single point;Shower seat;Grab bars - tub/shower;Adaptive equipment Adaptive Equipment: Reacher;Sock aid;Other (Comment) (Toilet tongs)        Prior Functioning/Environment Level of Independence: Independent with assistive device(s)        Comments: pt intermittently used cane and AE        OT Problem List: Decreased strength;Decreased range of motion;Decreased activity tolerance;Impaired vision/perception;Impaired balance (sitting and/or standing);Decreased safety awareness;Decreased knowledge of use of DME or AE;Decreased knowledge of precautions;Pain       AM-PAC OT "6 Clicks" Daily Activity     Outcome Measure Help from another person  eating meals?: None Help from another person taking care of personal grooming?: None Help from another person toileting, which includes using toliet, bedpan, or urinal?: None Help from another person bathing (including washing, rinsing, drying)?: A Little Help from another person to put on and taking off regular upper body clothing?: None Help from another person to put on and taking off regular lower body clothing?: A Little 6 Click Score: 22   End of Session Equipment Utilized During Treatment: Back brace Nurse Communication: Mobility status;Other (comment) (OT needs for d/c)  Activity Tolerance: Patient tolerated treatment well Patient left: in chair;with call bell/phone within reach  OT Visit Diagnosis: Unsteadiness on feet (R26.81);Other abnormalities of gait and mobility (R26.89);Pain Pain - Right/Left:  (Back pain) Pain - part of body:  (back)                Time: 1610-9604 OT Time Calculation (min): 28 min Charges:  OT General Charges $OT Visit: 1 Visit OT Evaluation $OT Eval Low Complexity: 1 Low OT Treatments $Self Care/Home Management : 8-22 mins    Forrest Jaroszewski A Gitty Osterlund 02/26/2021, 11:32 AM

## 2021-02-26 NOTE — Plan of Care (Signed)
Patient alert and oriented, voiding adequately, MAE well with no difficulty. Incision area cdi with no s/s of infection. Patient discharged home per order. Patient and sister stated understanding of discharge instructions given. Patient has an appointment with Dr. Yetta Barre

## 2021-02-26 NOTE — Evaluation (Signed)
Physical Therapy Evaluation Patient Details Name: [name] MRN: [id number] DOB: 02/25/1946 Today's Date: 02/26/2021   History of Present Illness  Patient is a 75 y/o female who presents s/p L1-2 PLIF on 02/25/21. PMH significant for macular degeneration B eyes, hypothyroidism, HTN, CKD, ACDF, posterior cervical fusion.    Clinical Impression  Pt admitted with above diagnosis. At the time of PT eval, pt was able to demonstrate transfers and ambulation with modified independence to supervision for safety and RW for support. Pt initially with SPC but trialed RW and pt reports feeling more comfortable with it. Pt was educated on precautions, brace application/wearing schedule, appropriate activity progression, and car transfer. Pt currently with functional limitations due to the deficits listed below (see PT Problem List). Pt will benefit from skilled PT to increase their independence and safety with mobility to allow discharge to the venue listed below.      Follow Up Recommendations No PT follow up;Supervision for mobility/OOB    Equipment Recommendations  Rolling walker with 5" wheels    Recommendations for Other Services       Precautions / Restrictions Precautions Precautions: Fall;Back Precaution Booklet Issued: Yes (comment) Precaution Comments: educated pt on back hand out Required Braces or Orthoses: Spinal Brace Spinal Brace: Lumbar corset Restrictions Weight Bearing Restrictions: No      Mobility  Bed Mobility Overal bed mobility: Modified Independent             General bed mobility comments: Pt was received sitting up in the recliner. Reviewed log roll technique verbally.    Transfers Overall transfer level: Modified independent Equipment used: Rolling walker (2 wheeled);Straight cane             General transfer comment: Increased time but no assist required. SPC initially and RW at end of session as pt feels more comfortable with RW for  ambulation.  Ambulation/Gait Ambulation/Gait assistance: Supervision Gait Distance (Feet): 250 Feet Assistive device: Rolling walker (2 wheeled);Straight cane Gait Pattern/deviations: Step-through pattern;Decreased stride length;Trunk flexed Gait velocity: Decreased Gait velocity interpretation: 1.31 - 2.62 ft/sec, indicative of limited community ambulator General Gait Details: SPC initially with transition to RW. Pt reports feeling more comfortable with the RW. Mild unsteadiness with SPC but no assist required to recover.  Stairs            Wheelchair Mobility    Modified Rankin (Stroke Patients Only)       Balance Overall balance assessment: Needs assistance Sitting-balance support: Feet supported;No upper extremity supported Sitting balance-Leahy Scale: Fair     Standing balance support: Single extremity supported;During functional activity Standing balance-Leahy Scale: Fair Standing balance comment: No overt LOB without UE support however appears mildly unsteady.                             Pertinent Vitals/Pain Pain Assessment: No/denies pain    Home Living Family/patient expects to be discharged to:: Private residence Living Arrangements: Other relatives;Spouse/significant other Available Help at Discharge: Family Type of Home: House Home Access: Level entry     Home Layout: One level Home Equipment: Environmental consultant - 2 wheels;Cane - single point;Shower seat;Grab bars - tub/shower;Adaptive equipment      Prior Function Level of Independence: Independent with assistive device(s)         Comments: pt intermittently used cane and AE     Hand Dominance   Dominant Hand: Right    Extremity/Trunk Assessment   Upper Extremity Assessment Upper  Extremity Assessment: Defer to OT evaluation    Lower Extremity Assessment Lower Extremity Assessment: Generalized weakness (Consistent with pre-op diagnosis)    Cervical / Trunk Assessment Cervical /  Trunk Assessment: Other exceptions Cervical / Trunk Exceptions: s/p surgery  Communication   Communication: No difficulties  Cognition Arousal/Alertness: Awake/alert Behavior During Therapy: WFL for tasks assessed/performed Overall Cognitive Status: Within Functional Limits for tasks assessed                                        General Comments General comments (skin integrity, edema, etc.): Pt with several skin tears of BUE, pt reports this is due to her puppy jumping on her    Exercises     Assessment/Plan    PT Assessment Patient needs continued PT services  PT Problem List Decreased strength;Decreased activity tolerance;Decreased balance;Decreased mobility;Decreased knowledge of use of DME;Decreased safety awareness;Decreased knowledge of precautions;Pain       PT Treatment Interventions DME instruction;Gait training;Functional mobility training;Therapeutic activities;Therapeutic exercise;Neuromuscular re-education;Patient/family education    PT Goals (Current goals can be found in the Care Plan section)  Acute Rehab PT Goals Patient Stated Goal: Home today, be able to take care of her husband and dog PT Goal Formulation: With patient Time For Goal Achievement: 03/05/21 Potential to Achieve Goals: Good    Frequency Min 5X/week   Barriers to discharge        Co-evaluation               AM-PAC PT "6 Clicks" Mobility  Outcome Measure Help needed turning from your back to your side while in a flat bed without using bedrails?: None Help needed moving from lying on your back to sitting on the side of a flat bed without using bedrails?: None Help needed moving to and from a bed to a chair (including a wheelchair)?: A Little Help needed standing up from a chair using your arms (e.g., wheelchair or bedside chair)?: None Help needed to walk in hospital room?: A Little Help needed climbing 3-5 steps with a railing? : A Little 6 Click Score: 21     End of Session Equipment Utilized During Treatment: Gait belt;Back brace Activity Tolerance: Patient tolerated treatment well Patient left: in chair;with call bell/phone within reach Nurse Communication: Mobility status PT Visit Diagnosis: Unsteadiness on feet (R26.81);Pain Pain - part of body:  (back)    Time: 2423-5361 PT Time Calculation (min) (ACUTE ONLY): 21 min   Charges:   PT Evaluation $PT Eval Low Complexity: 1 Low          Conni Slipper, PT, DPT Acute Rehabilitation Services Pager: [phone] Office: [phone]   Marylynn Pearson 02/26/2021, 11:14 AM

## 2021-02-26 NOTE — Discharge Summary (Signed)
Physician Discharge Summary  Patient ID: [name] MRN: [id number] DOB/AGE: 03/16/1946 75 y.o.  Admit date: 02/25/2021 Discharge date: 02/26/2021  Admission Diagnoses: adjacent level stenosis, back and leg pain   Discharge Diagnoses: same   Discharged Condition: good  Hospital Course: The patient was admitted on 02/25/2021 and taken to the operating room where the patient underwent PLIF L1-2. The patient tolerated the procedure well and was taken to the recovery room and then to the floor in stable condition. The hospital course was routine. There were no complications. The wound remained clean dry and intact. Pt had appropriate back soreness. No complaints of leg pain or new N/T/W. The patient remained afebrile with stable vital signs, and tolerated a regular diet. The patient continued to increase activities, and pain was well controlled with oral pain medications.   Consults: None   Meds: oxycodone and flexeril, resume all home meds  Significant Diagnostic Studies:  Results for orders placed or performed during the hospital encounter of 02/25/21  Basic metabolic panel  Result Value Ref Range   Sodium 136 135 - 145 mmol/L   Potassium 3.8 3.5 - 5.1 mmol/L   Chloride 105 98 - 111 mmol/L   CO2 22 22 - 32 mmol/L   Glucose, Bld 100 (H) 70 - 99 mg/dL   BUN 28 (H) 8 - 23 mg/dL   Creatinine, Ser 1.32 (H) 0.44 - 1.00 mg/dL   Calcium 9.2 8.9 - 44.0 mg/dL   GFR, Estimated 54 (L) >60 mL/min   Anion gap 9 5 - 15  CBC WITH DIFFERENTIAL  Result Value Ref Range   WBC 8.6 4.0 - 10.5 K/uL   RBC 4.39 3.87 - 5.11 MIL/uL   Hemoglobin 13.6 12.0 - 15.0 g/dL   HCT 10.2 72.5 - 36.6 %   MCV 93.2 80.0 - 100.0 fL   MCH 31.0 26.0 - 34.0 pg   MCHC 33.3 30.0 - 36.0 g/dL   RDW 44.0 34.7 - 42.5 %   Platelets 282 150 - 400 K/uL   nRBC 0.0 0.0 - 0.2 %   Neutrophils Relative % 59 %   Neutro Abs 5.1 1.7 - 7.7 K/uL   Lymphocytes Relative 25 %   Lymphs Abs 2.1 0.7 - 4.0 K/uL   Monocytes Relative 12 %    Monocytes Absolute 1.0 0.1 - 1.0 K/uL   Eosinophils Relative 3 %   Eosinophils Absolute 0.3 0.0 - 0.5 K/uL   Basophils Relative 1 %   Basophils Absolute 0.0 0.0 - 0.1 K/uL   Immature Granulocytes 0 %   Abs Immature Granulocytes 0.02 0.00 - 0.07 K/uL  Protime-INR  Result Value Ref Range   Prothrombin Time 12.8 11.4 - 15.2 seconds   INR 1.0 0.8 - 1.2  Type and screen [facility]  Result Value Ref Range   ABO/RH(D) O POS    Antibody Screen NEG    Sample Expiration      02/28/2021,2359 Performed at [id number], [address] [place], [place] [address]   ABO/Rh  Result Value Ref Range   ABO/RH(D)      O POS Performed at [facility], [address] [place], [place] [address]     Chest 2 View  Result Date: 02/25/2021 CLINICAL DATA:  75 year old female preoperative study for lumbar surgery. EXAM: CHEST - 2 VIEW COMPARISON:  Chest radiographs 10/17/2018 and earlier. FINDINGS: Anterior and posterior lower cervical and upper thoracic spine fusion hardware along with multilevel lumbar posterior and  interbody fusion sequelae again noted. Widespread thoracic disc and endplate degeneration. No acute osseous abnormality identified. Lung volumes and mediastinal contours are stable and within normal limits. Lung markings appear stable and within normal limits. No pneumothorax or pleural effusion. Paucity of bowel gas. IMPRESSION: No acute cardiopulmonary abnormality. Electronically Signed   By: Odessa Fleming M.D.   On: 02/25/2021 07:20   DG Lumbar Spine 2-3 Views  Result Date: 02/25/2021 CLINICAL DATA:  Lumbar fusion extension. EXAM: LUMBAR SPINE - 2-3 VIEW; DG C-ARM 1-60 MIN FLUOROSCOPY TIME:  56 seconds. C-arm fluoroscopic images were obtained intraoperatively and submitted for post operative interpretation. COMPARISON:  MRI lumbar spine dated January 30, 2021. FINDINGS: AP and lateral intraoperative fluoroscopic images demonstrate PLIF extension to L1-L2. Additional  hardware included in the field of view from L2-L4. No acute osseous abnormality. IMPRESSION: 1. Intraoperative fluoroscopic guidance for PLIF extension to L1-L2. Electronically Signed   By: Obie Dredge M.D.   On: 02/25/2021 14:57   DG C-Arm 1-60 Min  Result Date: 02/25/2021 CLINICAL DATA:  Lumbar fusion extension. EXAM: LUMBAR SPINE - 2-3 VIEW; DG C-ARM 1-60 MIN FLUOROSCOPY TIME:  56 seconds. C-arm fluoroscopic images were obtained intraoperatively and submitted for post operative interpretation. COMPARISON:  MRI lumbar spine dated January 30, 2021. FINDINGS: AP and lateral intraoperative fluoroscopic images demonstrate PLIF extension to L1-L2. Additional hardware included in the field of view from L2-L4. No acute osseous abnormality. IMPRESSION: 1. Intraoperative fluoroscopic guidance for PLIF extension to L1-L2. Electronically Signed   By: Obie Dredge M.D.   On: 02/25/2021 14:57   MM 3D SCREEN BREAST BILATERAL  Result Date: 02/19/2021 CLINICAL DATA:  Screening. EXAM: DIGITAL SCREENING BILATERAL MAMMOGRAM WITH TOMOSYNTHESIS AND CAD TECHNIQUE: Bilateral screening digital craniocaudal and mediolateral oblique mammograms were obtained. Bilateral screening digital breast tomosynthesis was performed. The images were evaluated with computer-aided detection. COMPARISON:  Previous exam(s). ACR Breast Density Category a: The breast tissue is almost entirely fatty. FINDINGS: There are no findings suspicious for malignancy. The images were evaluated with computer-aided detection. IMPRESSION: No mammographic evidence of malignancy. A result letter of this screening mammogram will be mailed directly to the patient. RECOMMENDATION: Screening mammogram in one year. (Code:[id number]-B-01Y) BI-RADS CATEGORY  1: Negative. Electronically Signed   By: Sande Brothers M.D.   On: 02/19/2021 13:09    Antibiotics:  Anti-infectives (From admission, onward)   Start     Dose/Rate Route Frequency Ordered Stop   02/25/21 1730   ceFAZolin (ANCEF) IVPB 2g/100 mL premix        2 g 200 mL/hr over 30 Minutes Intravenous Every 8 hours 02/25/21 1529 02/26/21 0240   02/25/21 0645  ceFAZolin (ANCEF) IVPB 2g/100 mL premix        2 g 200 mL/hr over 30 Minutes Intravenous On call to O.R. 02/25/21 0641 02/25/21 1013      Discharge Exam: Blood pressure 110/70, pulse 97, temperature 98 F (36.7 C), temperature source Oral, resp. rate 18, height 5\' 5"  (1.651 m), weight 84.8 kg, SpO2 91 %. Neurologic: Grossly normal Dressing dry  Discharge Medications:     Disposition: home   Final Dx: PLIF L1-2       Signed: 02/26/2021, 7:55 AM

## 2021-02-28 DIAGNOSIS — Z4789 Encounter for other orthopedic aftercare: Secondary | ICD-10-CM | POA: Diagnosis not present

## 2021-02-28 DIAGNOSIS — N189 Chronic kidney disease, unspecified: Secondary | ICD-10-CM | POA: Diagnosis not present

## 2021-02-28 DIAGNOSIS — R2681 Unsteadiness on feet: Secondary | ICD-10-CM | POA: Diagnosis not present

## 2021-02-28 DIAGNOSIS — I10 Essential (primary) hypertension: Secondary | ICD-10-CM | POA: Diagnosis not present

## 2021-02-28 DIAGNOSIS — M48061 Spinal stenosis, lumbar region without neurogenic claudication: Secondary | ICD-10-CM | POA: Diagnosis not present

## 2021-03-02 DIAGNOSIS — M48061 Spinal stenosis, lumbar region without neurogenic claudication: Secondary | ICD-10-CM | POA: Diagnosis not present

## 2021-03-02 DIAGNOSIS — I10 Essential (primary) hypertension: Secondary | ICD-10-CM | POA: Diagnosis not present

## 2021-03-02 DIAGNOSIS — N189 Chronic kidney disease, unspecified: Secondary | ICD-10-CM | POA: Diagnosis not present

## 2021-03-02 DIAGNOSIS — Z4789 Encounter for other orthopedic aftercare: Secondary | ICD-10-CM | POA: Diagnosis not present

## 2021-03-02 DIAGNOSIS — R2681 Unsteadiness on feet: Secondary | ICD-10-CM | POA: Diagnosis not present

## 2021-03-03 ENCOUNTER — Emergency Department (HOSPITAL_COMMUNITY): Payer: Medicare Other

## 2021-03-03 ENCOUNTER — Inpatient Hospital Stay (HOSPITAL_COMMUNITY)
Admission: EM | Admit: 2021-03-03 | Discharge: 2021-03-06 | DRG: 866 | Disposition: A | Discharge status: Home / Self Care | Payer: Medicare Other | Attending: Neurosurgery | Admitting: Neurosurgery

## 2021-03-03 ENCOUNTER — Other Ambulatory Visit: Payer: Self-pay

## 2021-03-03 ENCOUNTER — Encounter (HOSPITAL_COMMUNITY): Payer: Self-pay | Admitting: Emergency Medicine

## 2021-03-03 DIAGNOSIS — R197 Diarrhea, unspecified: Secondary | ICD-10-CM | POA: Diagnosis not present

## 2021-03-03 DIAGNOSIS — Z981 Arthrodesis status: Secondary | ICD-10-CM | POA: Diagnosis not present

## 2021-03-03 DIAGNOSIS — A419 Sepsis, unspecified organism: Secondary | ICD-10-CM

## 2021-03-03 DIAGNOSIS — M4807 Spinal stenosis, lumbosacral region: Secondary | ICD-10-CM | POA: Diagnosis not present

## 2021-03-03 DIAGNOSIS — F32A Depression, unspecified: Secondary | ICD-10-CM | POA: Diagnosis not present

## 2021-03-03 DIAGNOSIS — Z91040 Latex allergy status: Secondary | ICD-10-CM | POA: Diagnosis not present

## 2021-03-03 DIAGNOSIS — R1110 Vomiting, unspecified: Secondary | ICD-10-CM | POA: Diagnosis not present

## 2021-03-03 DIAGNOSIS — Z7982 Long term (current) use of aspirin: Secondary | ICD-10-CM | POA: Diagnosis not present

## 2021-03-03 DIAGNOSIS — E669 Obesity, unspecified: Secondary | ICD-10-CM | POA: Diagnosis present

## 2021-03-03 DIAGNOSIS — F419 Anxiety disorder, unspecified: Secondary | ICD-10-CM | POA: Diagnosis present

## 2021-03-03 DIAGNOSIS — Z79899 Other long term (current) drug therapy: Secondary | ICD-10-CM

## 2021-03-03 DIAGNOSIS — Z20822 Contact with and (suspected) exposure to covid-19: Secondary | ICD-10-CM | POA: Diagnosis not present

## 2021-03-03 DIAGNOSIS — Z6831 Body mass index (BMI) 31.0-31.9, adult: Secondary | ICD-10-CM

## 2021-03-03 DIAGNOSIS — M5127 Other intervertebral disc displacement, lumbosacral region: Secondary | ICD-10-CM | POA: Diagnosis not present

## 2021-03-03 DIAGNOSIS — Z888 Allergy status to other drugs, medicaments and biological substances status: Secondary | ICD-10-CM | POA: Diagnosis not present

## 2021-03-03 DIAGNOSIS — R509 Fever, unspecified: Secondary | ICD-10-CM | POA: Diagnosis not present

## 2021-03-03 DIAGNOSIS — E039 Hypothyroidism, unspecified: Secondary | ICD-10-CM | POA: Diagnosis not present

## 2021-03-03 DIAGNOSIS — Z885 Allergy status to narcotic agent status: Secondary | ICD-10-CM | POA: Diagnosis not present

## 2021-03-03 DIAGNOSIS — Z79890 Hormone replacement therapy (postmenopausal): Secondary | ICD-10-CM

## 2021-03-03 DIAGNOSIS — M419 Scoliosis, unspecified: Secondary | ICD-10-CM | POA: Diagnosis not present

## 2021-03-03 DIAGNOSIS — B349 Viral infection, unspecified: Principal | ICD-10-CM | POA: Diagnosis present

## 2021-03-03 DIAGNOSIS — M25511 Pain in right shoulder: Secondary | ICD-10-CM | POA: Diagnosis present

## 2021-03-03 DIAGNOSIS — R000 Tachycardia, unspecified: Secondary | ICD-10-CM | POA: Diagnosis not present

## 2021-03-03 LAB — COMPREHENSIVE METABOLIC PANEL
ALT: 25 U/L (ref 0–44)
AST: 26 U/L (ref 15–41)
Albumin: 3.6 g/dL (ref 3.5–5.0)
Alkaline Phosphatase: 85 U/L (ref 38–126)
Anion gap: 11 (ref 5–15)
BUN: 14 mg/dL (ref 8–23)
CO2: 27 mmol/L (ref 22–32)
Calcium: 9.2 mg/dL (ref 8.9–10.3)
Chloride: 96 mmol/L — ABNORMAL LOW (ref 98–111)
Creatinine, Ser: 0.99 mg/dL (ref 0.44–1.00)
GFR, Estimated: >60 mL/min (ref >60)
Glucose, Bld: 125 mg/dL — ABNORMAL HIGH (ref 70–99)
Potassium: 4.2 mmol/L (ref 3.5–5.1)
Sodium: 134 mmol/L — ABNORMAL LOW (ref 135–145)
Total Bilirubin: 0.8 mg/dL (ref 0.3–1.2)
Total Protein: 7.5 g/dL (ref 6.5–8.1)

## 2021-03-03 LAB — CBC WITH DIFFERENTIAL/PLATELET
Abs Immature Granulocytes: 0.07 10*3/uL (ref 0.00–0.07)
Basophils Absolute: 0 10*3/uL (ref 0.0–0.1)
Basophils Relative: 0 %
Eosinophils Absolute: 0.3 10*3/uL (ref 0.0–0.5)
Eosinophils Relative: 3 %
HCT: 42.9 % (ref 36.0–46.0)
Hemoglobin: 14.2 g/dL (ref 12.0–15.0)
Immature Granulocytes: 1 %
Lymphocytes Relative: 9 %
Lymphs Abs: 1 10*3/uL (ref 0.7–4.0)
MCH: 31.3 pg (ref 26.0–34.0)
MCHC: 33.1 g/dL (ref 30.0–36.0)
MCV: 94.5 fL (ref 80.0–100.0)
Monocytes Absolute: 0.7 10*3/uL (ref 0.1–1.0)
Monocytes Relative: 6 %
Neutro Abs: 9.6 10*3/uL — ABNORMAL HIGH (ref 1.7–7.7)
Neutrophils Relative %: 81 %
Platelets: 351 10*3/uL (ref 150–400)
RBC: 4.54 MIL/uL (ref 3.87–5.11)
RDW: 13.5 % (ref 11.5–15.5)
WBC: 11.8 10*3/uL — ABNORMAL HIGH (ref 4.0–10.5)
nRBC: 0 % (ref 0.0–0.2)

## 2021-03-03 LAB — LACTIC ACID, PLASMA: Lactic Acid, Venous: 1.7 mmol/L (ref 0.5–1.9)

## 2021-03-03 LAB — URINALYSIS, ROUTINE W REFLEX MICROSCOPIC
Bilirubin Urine: NEGATIVE
Glucose, UA: NEGATIVE mg/dL
Hgb urine dipstick: NEGATIVE
Ketones, ur: NEGATIVE mg/dL
Leukocytes,Ua: NEGATIVE
Nitrite: NEGATIVE
Protein, ur: NEGATIVE mg/dL
Specific Gravity, Urine: 1.009 (ref 1.005–1.030)
pH: 9 — ABNORMAL HIGH (ref 5.0–8.0)

## 2021-03-03 LAB — PROTIME-INR
INR: 1 (ref 0.8–1.2)
Prothrombin Time: 12.7 seconds (ref 11.4–15.2)

## 2021-03-03 LAB — RESP PANEL BY RT-PCR (FLU A&B, COVID) ARPGX2
Influenza A by PCR: NEGATIVE
Influenza B by PCR: NEGATIVE
SARS Coronavirus 2 by RT PCR: NEGATIVE

## 2021-03-03 LAB — APTT: aPTT: 28 seconds (ref 24–36)

## 2021-03-03 LAB — LIPASE, BLOOD: Lipase: 32 U/L (ref 11–51)

## 2021-03-03 MED ORDER — LACTATED RINGERS IV BOLUS (SEPSIS): 1000.0000 mL | Freq: Once | INTRAVENOUS | Status: DC

## 2021-03-03 MED ORDER — VANCOMYCIN HCL 1750 MG/350ML IV SOLN
1750.0000 mg | Freq: Once | INTRAVENOUS | Status: AC
  Administered 2021-03-03: 1750 mg via INTRAVENOUS
  Filled 2021-03-03 (×2): qty 350

## 2021-03-03 MED ORDER — ACETAMINOPHEN 325 MG PO TABS
650.0000 mg | ORAL_TABLET | Freq: Once | ORAL | Status: AC | PRN
  Administered 2021-03-03: 650 mg via ORAL
  Filled 2021-03-03: qty 2

## 2021-03-03 MED ORDER — VANCOMYCIN HCL 1250 MG/250ML IV SOLN: 1250.0000 mg | INTRAVENOUS | Status: DC

## 2021-03-03 MED ORDER — CEFEPIME HCL 2 G IJ SOLR
2.0000 g | Freq: Once | INTRAMUSCULAR | Status: AC
Start: 2021-03-03 — End: 2021-03-03
  Administered 2021-03-03: 2 g via INTRAVENOUS
  Filled 2021-03-03: qty 2

## 2021-03-03 MED ORDER — SODIUM CHLORIDE 0.9 % IV SOLN
2.0000 g | Freq: Two times a day (BID) | INTRAVENOUS | Status: DC
  Administered 2021-03-04: 2 g via INTRAVENOUS
  Filled 2021-03-03: qty 2

## 2021-03-03 MED ORDER — VANCOMYCIN HCL 1000 MG/200ML IV SOLN: 1000.0000 mg | Freq: Once | INTRAVENOUS | Status: DC

## 2021-03-03 MED ORDER — SODIUM CHLORIDE 0.9 % IV BOLUS
1000.0000 mL | Freq: Once | INTRAVENOUS | Status: AC
  Administered 2021-03-03: 1000 mL via INTRAVENOUS

## 2021-03-03 NOTE — Sepsis Progress Note (Signed)
Sepsis protocol os being monitored by [employer].

## 2021-03-03 NOTE — ED Triage Notes (Signed)
Pt had back surgery on Monday.  Reports R shoulder pain that started 3 days after surgery.  Also c/o dry mouth, nausea, chills, and generalized weakness.

## 2021-03-03 NOTE — ED Provider Notes (Signed)
[facility] EMERGENCY DEPARTMENT Provider Note   CSN: [id number] Arrival date & time: 03/03/21  1744     History Chief Complaint  Patient presents with  . Shoulder Pain  . Code Sepsis  . Fever    [name] is a 75 y.o. female.  Patient here with fever.  Had L1/L2 surgery earlier this week with Dr. Yetta Barre.  Felt pretty well after surgery.  However developed a fever today.  Developed some pain to the right shoulder, right side of the neck as well that seems to be with movement.  History of chronic neck and chronic back issues.  Thinks that she has been using her upper extremity more to help her get around and caused some spasm in her back.  As far as her lower back she feels fairly good.  She does not have any new numbness or weakness in her legs.  She has chronic lateral numbness to the right lower leg.  In the upper thigh area.  She denies any loss of bowel.  She states that she has some incontinence chronically.  Denies any urinary retention but states that she has some overflow incontinence.  Overall no new back pain or neurologic findings such in the lower extremities.  Denies any abdominal pain, cough, sputum production, pain with urination.  The history is provided by the patient.  Fever Temp source:  Oral Severity:  Mild Onset quality:  Gradual Duration:  1 day Timing:  Constant Progression:  Unchanged Chronicity:  New Relieved by:  Nothing Worsened by:  Nothing Associated symptoms: chills   Associated symptoms: no chest pain, no cough, no dysuria, no ear pain, no rash, no sore throat and no vomiting        Past Medical History:  Diagnosis Date  . Anxiety   . Arthritis   . Chronic kidney disease 2019   ARF while in hospital for PLIF  . Depression   . GERD (gastroesophageal reflux disease)   . Hypertension   . Hypothyroidism   . Macular degeneration of both eyes    not age related  . PONV (postoperative nausea and vomiting)    also is slow  to wake up    Patient Active Problem List   Diagnosis Date Noted  . S/P lumbar fusion 02/25/2021    Past Surgical History:  Procedure Laterality Date  . ABDOMINAL HYSTERECTOMY     total  . ANTERIOR CERVICAL DISCECTOMY    . APPENDECTOMY    . BACK SURGERY     PLIF  . BUNIONECTOMY Right   . CHOLECYSTECTOMY    . COLONOSCOPY    . HAND SURGERY Left    thumb joint, carpal tunnel and cyst  . HAND SURGERY Right    thumb and carpal tunnel  . POSTERIOR CERVICAL FUSION/FORAMINOTOMY       OB History   No obstetric history on file.     Family History  Problem Relation Age of Onset  . Breast cancer Neg Hx     Social History   Tobacco Use  . Smoking status: Never Smoker  . Smokeless tobacco: Never Used  Vaping Use  . Vaping Use: Never used  Substance Use Topics  . Alcohol use: Never  . Drug use: Never    Home Medications Prior to Admission medications   Medication Sig Start Date End Date Taking? Authorizing Provider  acetaminophen (TYLENOL) 650 MG CR tablet Take 650 mg by mouth every 8 (eight) hours as needed for pain.  [provider]  ALPRAZolam Prudy Feeler) 0.5 MG tablet Take 0.25 mg by mouth at bedtime as needed for sleep. 01/04/21   [provider]  aspirin 81 MG chewable tablet Chew 81 mg by mouth daily.    [provider]  cyclobenzaprine (FLEXERIL) 10 MG tablet Take 1 tablet (10 mg total) by mouth 3 (three) times daily as needed for muscle spasms. 02/26/21   Meyran, Tiana Loft, NP  DULoxetine (CYMBALTA) 60 MG capsule Take 60 mg by mouth at bedtime. 01/04/21   [provider]  levothyroxine (SYNTHROID) 75 MCG tablet Take 75 mcg by mouth daily before breakfast. 01/04/21   [provider]  lisinopril (ZESTRIL) 20 MG tablet Take 20 mg by mouth at bedtime. 01/04/21   [provider]  Multiple Vitamin (MULTIVITAMIN WITH MINERALS) TABS tablet Take 1 tablet by mouth daily.    [provider]   oxyCODONE-acetaminophen (PERCOCET) 5-325 MG tablet Take 1 tablet by mouth every 4 (four) hours as needed for severe pain. 02/26/21 02/26/22  Meyran, Tiana Loft, NP  traMADol (ULTRAM) 50 MG tablet Take 50 mg by mouth 2 (two) times daily as needed for pain. 01/04/21   [provider]    Allergies    Codeine, Morphine and related, and Tape  Review of Systems   Review of Systems  Constitutional: Positive for chills and fever.  HENT: Negative for ear pain and sore throat.   Eyes: Negative for pain and visual disturbance.  Respiratory: Negative for cough and shortness of breath.   Cardiovascular: Negative for chest pain and palpitations.  Gastrointestinal: Negative for abdominal pain and vomiting.  Genitourinary: Negative for dysuria and hematuria.  Musculoskeletal: Negative for arthralgias and back pain.  Skin: Negative for color change and rash.  Neurological: Negative for seizures and syncope.  All other systems reviewed and are negative.   Physical Exam Updated Vital Signs BP 125/77   Pulse (!) 101   Temp (!) 101.6 F (38.7 C)   Resp (!) 21   Ht 5\' 5"  (1.651 m)   Wt 84.8 kg   SpO2 97%   BMI 31.11 kg/m   Physical Exam Vitals and nursing note reviewed.  Constitutional:      General: She is not in acute distress.    Appearance: She is well-developed. She is not ill-appearing.  HENT:     Head: Normocephalic and atraumatic.     Nose: Nose normal.     Mouth/Throat:     Mouth: Mucous membranes are moist.  Eyes:     Extraocular Movements: Extraocular movements intact.     Conjunctiva/sclera: Conjunctivae normal.     Pupils: Pupils are equal, round, and reactive to light.  Cardiovascular:     Rate and Rhythm: Regular rhythm. Tachycardia present.     Heart sounds: Normal heart sounds. No murmur heard.   Pulmonary:     Effort: Pulmonary effort is normal. No respiratory distress.     Breath sounds: Normal breath sounds.  Abdominal:     General: Abdomen is  flat.     Palpations: Abdomen is soft.     Tenderness: There is no abdominal tenderness.  Musculoskeletal:        General: Tenderness present. Normal range of motion.     Cervical back: Normal range of motion and neck supple.     Comments: Tenderness to the right trapezius area, right sided paraspinal muscles in the cervical spine, no midline spinal pain  Skin:    General: Skin is warm and  dry.     Comments: Surgical site in the low back appears to be clean dry and intact, no tenderness, no purulent drainage  Neurological:     General: No focal deficit present.     Mental Status: She is alert and oriented to person, place, and time.     Cranial Nerves: No cranial nerve deficit.     Motor: No weakness.     Comments: Decreased sensation over the right lateral thigh but otherwise sensation is normal, when asked to increase rectal tone she appears to have good tone, 5+ out of 5 strength in the lower extremities and 5+ out of 5 strength in upper extremities with normal sensation     ED Results / Procedures / Treatments   Labs (all labs ordered are listed, but only abnormal results are displayed) Labs Reviewed  COMPREHENSIVE METABOLIC PANEL - Abnormal; Notable for the following components:      Result Value   Sodium 134 (*)    Chloride 96 (*)    Glucose, Bld 125 (*)    All other components within normal limits  CBC WITH DIFFERENTIAL/PLATELET - Abnormal; Notable for the following components:   WBC 11.8 (*)    Neutro Abs 9.6 (*)    All other components within normal limits  URINALYSIS, ROUTINE W REFLEX MICROSCOPIC - Abnormal; Notable for the following components:   pH 9.0 (*)    All other components within normal limits  RESP PANEL BY RT-PCR (FLU A&B, COVID) ARPGX2  CULTURE, BLOOD (ROUTINE X 2)  CULTURE, BLOOD (ROUTINE X 2)  URINE CULTURE  LACTIC ACID, PLASMA  PROTIME-INR  LIPASE, BLOOD  APTT  LACTIC ACID, PLASMA    EKG EKG Interpretation  Date/Time:  Sunday March 03 2021  21:07:29 EDT Ventricular Rate:  104 PR Interval:  158 QRS Duration: 76 QT Interval:  329 QTC Calculation: 433 R Axis:   -25 Text Interpretation: Sinus tachycardia Borderline left axis deviation Low voltage, precordial leads Abnormal R-wave progression, late transition Confirmed by Virgina Norfolk ([id number]) on 03/03/2021 10:08:20 PM   Radiology DG Shoulder Right  Result Date: 03/03/2021 CLINICAL DATA:  Right shoulder pain EXAM: RIGHT SHOULDER - 2+ VIEW COMPARISON:  None. FINDINGS: Degenerative changes in the right [facility] and glenohumeral joints with joint space narrowing and spurring. No acute bony abnormality. Specifically, no fracture, subluxation, or dislocation. IMPRESSION: Moderate degenerative changes.  No acute bony abnormality. Electronically Signed   By: Charlett Nose M.D.   On: 03/03/2021 20:08   DG Chest Portable 1 View  Result Date: 03/03/2021 CLINICAL DATA:  Fever. EXAM: PORTABLE CHEST 1 VIEW COMPARISON:  02/25/2021 FINDINGS: Cervical and thoracic spine fixation. Midline trachea. Normal heart size for level of inspiration. Apical lordotic positioning. No pleural effusion or pneumothorax. No congestive failure. Clear lungs. IMPRESSION: No acute cardiopulmonary disease. Electronically Signed   By: Jeronimo Greaves M.D.   On: 03/03/2021 19:01    Procedures Procedures   Medications Ordered in ED Medications  vancomycin (VANCOREADY) IVPB 1750 mg/350 mL (1,750 mg Intravenous New Bag/Given 03/03/21 2103)  vancomycin (VANCOREADY) IVPB 1250 mg/250 mL (has no administration in time range)  ceFEPIme (MAXIPIME) 2 g in sodium chloride 0.9 % 100 mL IVPB (has no administration in time range)  acetaminophen (TYLENOL) tablet 650 mg (650 mg Oral Given 03/03/21 1819)  ceFEPIme (MAXIPIME) 2 g in sodium chloride 0.9 % 100 mL IVPB (0 g Intravenous Stopped 03/03/21 2101)  sodium chloride 0.9 % bolus 1,000 mL (0 mLs Intravenous Stopped 03/03/21 2101)  ED Course  I have reviewed the triage vital signs and the  nursing notes.  Pertinent labs & imaging results that were available during my care of the patient were reviewed by me and considered in my medical decision making (see chart for details).    MDM Rules/Calculators/A&P                          [name] is a 75 year old female with history of hypertension, CKD, cervical fusion who presents the ED with fever.  Patient had L1/L2 PLIF about a week ago.  Started to develop a fever today.  Having some muscle spasms in her right shoulder.  Denies any cough, sputum production, abdominal pain.  Denies any urinary symptoms.  No new back pain.  Has some chronic numbness to the right lateral upper leg.  Denies any loss of bowel or bladder but does have some chronic urinary incontinence.  Neurologically she appears to be intact.  No new acute weakness or numbness.  Urinalysis negative for infection.  Covid test and flu test negative.  Chest x-ray negative for infection.  White count is 11.8.  Lactic acid is 1.7.  Given recent back surgery will get MRI to evaluate for possible infectious process.  Was given empiric IV antibiotics.  Awaiting MRI and then will talk with neurosurgery.  She overall appears well.  Handed off to oncoming ED staff.  Please see their note for further results, evaluation, disposition of the patient.  This chart was dictated using voice recognition software.  Despite best efforts to proofread,  errors can occur which can change the documentation meaning.    Final Clinical Impression(s) / ED Diagnoses Final diagnoses:  Fever, unspecified fever cause    Rx / DC Orders ED Discharge Orders    None       Virgina NorfolkCuratolo, Benjaman Artman, DO 03/03/21 2209

## 2021-03-03 NOTE — ED Notes (Signed)
Patient transported to MRI 

## 2021-03-03 NOTE — Progress Notes (Signed)
Pharmacy Antibiotic Note  [name] is a 75 y.o. female admitted on 03/03/2021 with sepsis.  Pharmacy has been consulted for vancomycin and cefepime dosing.  Plan: Vancomycin 1750 mg IV x 1, then 1250 mg IV q36h (eAUC 459, Goal AUC 400-550, SCr 0.99) Cefepime 2g IV every 12 hours Monitor renal function, Cx and clinical progression to narrow Vancomycin levels as indicated  Height: 5\' 5"  (165.1 cm) Weight: 84.8 kg (186 lb 15.2 oz) IBW/kg (Calculated) : 57  Temp (24hrs), Avg:101.6 F (38.7 C), Min:101.6 F (38.7 C), Max:101.6 F (38.7 C)  Recent Labs  Lab 02/25/21 0642 03/03/21 1757  WBC 8.6 11.8*  CREATININE 1.09* 0.99  LATICACIDVEN  --  1.7    Estimated Creatinine Clearance: 55.2 mL/min (by C-G formula based on SCr of 0.99 mg/dL).    Allergies  Allergen Reactions  . Codeine Nausea Only    Severe headache  . Morphine And Related Nausea And Vomiting    And headaches  . Tape Itching    03/05/21, PharmD Clinical Pharmacist ED Pharmacist Phone # [phone] 03/03/2021 7:15 PM

## 2021-03-04 DIAGNOSIS — Z91040 Latex allergy status: Secondary | ICD-10-CM | POA: Diagnosis not present

## 2021-03-04 DIAGNOSIS — Z20822 Contact with and (suspected) exposure to covid-19: Secondary | ICD-10-CM | POA: Diagnosis present

## 2021-03-04 DIAGNOSIS — Z981 Arthrodesis status: Secondary | ICD-10-CM | POA: Diagnosis not present

## 2021-03-04 DIAGNOSIS — R197 Diarrhea, unspecified: Secondary | ICD-10-CM | POA: Diagnosis not present

## 2021-03-04 DIAGNOSIS — Z79899 Other long term (current) drug therapy: Secondary | ICD-10-CM | POA: Diagnosis not present

## 2021-03-04 DIAGNOSIS — E039 Hypothyroidism, unspecified: Secondary | ICD-10-CM | POA: Diagnosis present

## 2021-03-04 DIAGNOSIS — Z6831 Body mass index (BMI) 31.0-31.9, adult: Secondary | ICD-10-CM | POA: Diagnosis not present

## 2021-03-04 DIAGNOSIS — F32A Depression, unspecified: Secondary | ICD-10-CM | POA: Diagnosis present

## 2021-03-04 DIAGNOSIS — B349 Viral infection, unspecified: Secondary | ICD-10-CM | POA: Diagnosis present

## 2021-03-04 DIAGNOSIS — F419 Anxiety disorder, unspecified: Secondary | ICD-10-CM | POA: Diagnosis present

## 2021-03-04 DIAGNOSIS — Z79890 Hormone replacement therapy (postmenopausal): Secondary | ICD-10-CM | POA: Diagnosis not present

## 2021-03-04 DIAGNOSIS — Z7982 Long term (current) use of aspirin: Secondary | ICD-10-CM | POA: Diagnosis not present

## 2021-03-04 DIAGNOSIS — R1110 Vomiting, unspecified: Secondary | ICD-10-CM | POA: Diagnosis not present

## 2021-03-04 DIAGNOSIS — M25511 Pain in right shoulder: Secondary | ICD-10-CM | POA: Diagnosis present

## 2021-03-04 DIAGNOSIS — R509 Fever, unspecified: Secondary | ICD-10-CM | POA: Diagnosis present

## 2021-03-04 DIAGNOSIS — Z885 Allergy status to narcotic agent status: Secondary | ICD-10-CM | POA: Diagnosis not present

## 2021-03-04 DIAGNOSIS — Z888 Allergy status to other drugs, medicaments and biological substances status: Secondary | ICD-10-CM | POA: Diagnosis not present

## 2021-03-04 LAB — CBC
HCT: 37.4 % (ref 36.0–46.0)
Hemoglobin: 12.3 g/dL (ref 12.0–15.0)
MCH: 31 pg (ref 26.0–34.0)
MCHC: 32.9 g/dL (ref 30.0–36.0)
MCV: 94.2 fL (ref 80.0–100.0)
Platelets: 238 10*3/uL (ref 150–400)
RBC: 3.97 MIL/uL (ref 3.87–5.11)
RDW: 13.6 % (ref 11.5–15.5)
WBC: 6.5 10*3/uL (ref 4.0–10.5)
nRBC: 0 % (ref 0.0–0.2)

## 2021-03-04 LAB — LACTIC ACID, PLASMA: Lactic Acid, Venous: 1 mmol/L (ref 0.5–1.9)

## 2021-03-04 MED ORDER — ADULT MULTIVITAMIN W/MINERALS CH
1.0000 | ORAL_TABLET | Freq: Every day | ORAL | Status: DC
  Administered 2021-03-04 – 2021-03-06 (×3): 1 via ORAL
  Filled 2021-03-04 (×3): qty 1

## 2021-03-04 MED ORDER — LISINOPRIL 20 MG PO TABS
20.0000 mg | ORAL_TABLET | Freq: Every day | ORAL | Status: DC
  Administered 2021-03-04 – 2021-03-05 (×2): 20 mg via ORAL
  Filled 2021-03-04 (×2): qty 1

## 2021-03-04 MED ORDER — CYCLOBENZAPRINE HCL 10 MG PO TABS
10.0000 mg | ORAL_TABLET | Freq: Three times a day (TID) | ORAL | Status: DC | PRN
  Administered 2021-03-04 – 2021-03-05 (×2): 10 mg via ORAL
  Filled 2021-03-04 (×2): qty 1

## 2021-03-04 MED ORDER — HEPARIN SODIUM (PORCINE) 5000 UNIT/ML IJ SOLN
5000.0000 [IU] | Freq: Three times a day (TID) | INTRAMUSCULAR | Status: DC
  Administered 2021-03-04 – 2021-03-06 (×7): 5000 [IU] via SUBCUTANEOUS
  Filled 2021-03-04 (×7): qty 1

## 2021-03-04 MED ORDER — OXYCODONE HCL 5 MG PO TABS
5.0000 mg | ORAL_TABLET | ORAL | Status: DC | PRN
  Administered 2021-03-04 – 2021-03-06 (×4): 10 mg via ORAL
  Filled 2021-03-04 (×4): qty 2

## 2021-03-04 MED ORDER — ALPRAZOLAM 0.25 MG PO TABS: 0.2500 mg | ORAL_TABLET | Freq: Every evening | ORAL | Status: DC | PRN

## 2021-03-04 MED ORDER — RISAQUAD PO CAPS
1.0000 | ORAL_CAPSULE | Freq: Every day | ORAL | Status: DC
  Administered 2021-03-04 – 2021-03-06 (×3): 1 via ORAL
  Filled 2021-03-04 (×3): qty 1

## 2021-03-04 MED ORDER — GADOBUTROL 1 MMOL/ML IV SOLN
8.0000 mL | Freq: Once | INTRAVENOUS | Status: AC | PRN
  Administered 2021-03-04: 8 mL via INTRAVENOUS

## 2021-03-04 MED ORDER — ASPIRIN 81 MG PO CHEW
81.0000 mg | CHEWABLE_TABLET | Freq: Every day | ORAL | Status: DC
  Administered 2021-03-04 – 2021-03-05 (×2): 81 mg via ORAL
  Filled 2021-03-04 (×2): qty 1

## 2021-03-04 MED ORDER — POTASSIUM CHLORIDE IN NACL 20-0.9 MEQ/L-% IV SOLN
INTRAVENOUS | Status: DC
  Filled 2021-03-04 (×4): qty 1000

## 2021-03-04 MED ORDER — LEVOTHYROXINE SODIUM 75 MCG PO TABS
75.0000 ug | ORAL_TABLET | Freq: Every day | ORAL | Status: DC
  Administered 2021-03-04 – 2021-03-06 (×3): 75 ug via ORAL
  Filled 2021-03-04 (×3): qty 1

## 2021-03-04 MED ORDER — DULOXETINE HCL 60 MG PO CPEP
60.0000 mg | ORAL_CAPSULE | Freq: Every day | ORAL | Status: DC
  Administered 2021-03-04 – 2021-03-05 (×2): 60 mg via ORAL
  Filled 2021-03-04 (×2): qty 1

## 2021-03-04 NOTE — Progress Notes (Addendum)
Initial Nutrition Assessment  DOCUMENTATION CODES:   Obesity unspecified  INTERVENTION:   -Magic cup BID with meals, each supplement provides 290 kcal and 9 grams of protein -Continue MVI with minerals daily  NUTRITION DIAGNOSIS:   Increased nutrient needs related to post-op healing as evidenced by estimated needs.  GOAL:   Patient will meet greater than or equal to 90% of their needs  MONITOR:   PO intake,Supplement acceptance,Diet advancement,Labs,Weight trends,Skin,I & O's  REASON FOR ASSESSMENT:   Malnutrition Screening Tool    ASSESSMENT:   [name] today felt chills, took her temperature and found it to be greater than 101. She came to the ED where a sepsis workup was initiated. Antibiotics were started, cefipime, and vancomycin.  Pt admitted with sepsis.  4/11- s/p 1. Decompressive lumbar laminectomyhemifacetectomy foraminotomies L1-2requiring more work than would be required for a simple exposure of the disk for PLIF in order to adequately decompress the neural elements and address the spinal stenosis 2. Posterior lumbar interbody fusionL1-2using PTIinterbody cages packed with morcellized allograft and autograft  3. Posterior fixationL1-3using NuVasivepedicle screws.  4. Intertransverse arthrodesisL1-2using morcellized autograft and allograft. 5.Removal of segmental instrumentation L2-4  Reviewed I/O's: +1.2 L x 24 hours  UOP: 550 ml x 24 hours  Spoke with pt at bedside, who reports she did not eat much today secondary to discomfort. PTA she has a very good appetite; she explains that she lives with her granddaughter, who cooks for her, and they consume 3 meals per day (Breakfast: cereal OR bacon and egg; Lunch and Dinner: meat, starch, and vegetable; Snack: ice cream or strong cheese).   Pt denies any weight loss. She shares that she has been trying to intentionally lose weight by decreasing snacking and making healthier foods choices. Reviewed  wt hx; wt has been stable over the past week. Per CareEverywhere, pt was 178# on 11/08/18.   Discussed importance of good meal and supplement intake to promote healing.   Medications reviewed and include 0.9% NaCl with KCl 20 mEq/L infusion @ 75 ml/hr.   Labs reviewed.   NUTRITION - FOCUSED PHYSICAL EXAM:  Flowsheet Row Most Recent Value  Orbital Region No depletion  Upper Arm Region No depletion  Thoracic and Lumbar Region No depletion  Buccal Region No depletion  Temple Region No depletion  Clavicle Bone Region No depletion  Clavicle and Acromion Bone Region No depletion  Scapular Bone Region No depletion  Dorsal Hand No depletion  Patellar Region No depletion  Anterior Thigh Region No depletion  Posterior Calf Region No depletion  Edema (RD Assessment) None  Hair Reviewed  Eyes Reviewed  Mouth Reviewed  Skin Reviewed  Nails Reviewed       Diet Order:   Diet Order            Diet regular Room service appropriate? Yes; Fluid consistency: Thin  Diet effective now                 EDUCATION NEEDS:   Education needs have been addressed  Skin:  Skin Assessment: Skin Integrity Issues: Skin Integrity Issues:: Incisions Incisions: closed back  Last BM:  03/03/21  Height:   Ht Readings from Last 1 Encounters:  03/03/21 5\' 5"  (1.651 m)    Weight:   Wt Readings from Last 1 Encounters:  03/03/21 84.8 kg    Ideal Body Weight:  56.8 kg  BMI:  Body mass index is 31.11 kg/m.  Estimated Nutritional Needs:   Kcal:  1700-1900  Protein:  100-115 grams  Fluid:  > 1.7 L    Levada Schilling, RD, LDN, CDCES Registered Dietitian II Certified Diabetes Care and Education Specialist Please refer to [facility] for RD and/or RD on-call/weekend/after hours pager

## 2021-03-04 NOTE — ED Provider Notes (Signed)
  Provider Note MRN:  [id number]  Arrival date & time: 03/04/21    ED Course and Medical Decision Making  Assumed care from Dr. Lockie Mola at shift change.  Fever of unknown source, recent back surgery, awaiting MRI lumbar spine.  MRI reveals fluid collection, likely seroma postoperatively but cannot exclude infection.  Given the lack of any other fever source, neurosurgery consulted and Dr. Franky Macho accepts for admission.  Patient doing better with fluids and antibiotics, in no acute distress on my evaluation.  .Critical Care Performed by: Sabas Sous, MD Authorized by: Sabas Sous, MD   Critical care provider statement:    Critical care time (minutes):  33   Critical care was necessary to treat or prevent imminent or life-threatening deterioration of the following conditions:  Sepsis   Critical care was time spent personally by me on the following activities:  Discussions with consultants, evaluation of patient's response to treatment, examination of patient, ordering and performing treatments and interventions, ordering and review of laboratory studies, ordering and review of radiographic studies, pulse oximetry, re-evaluation of patient's condition, obtaining history from patient or surrogate and review of old charts   I assumed direction of critical care for this patient from another provider in my specialty: yes     Care discussed with: admitting provider      Final Clinical Impressions(s) / ED Diagnoses     ICD-10-CM   1. Fever, unspecified fever cause  R50.9   2. Sepsis, due to unspecified organism, unspecified whether acute organ dysfunction present [facility])  A41.9     ED Discharge Orders    None      Discharge Instructions   None     Elmer Sow. Pilar Plate, MD [facility] Emergency Medicine [facility] [email or web address]    Sabas Sous, MD 03/04/21 [phone]

## 2021-03-04 NOTE — Plan of Care (Signed)
  Problem: Education: Goal: Knowledge of General Education information will improve Description: Including pain rating scale, medication(s)/side effects and non-pharmacologic comfort measures Outcome: Progressing   Problem: Clinical Measurements: Goal: Will remain free from infection Outcome: Progressing   Problem: Clinical Measurements: Goal: Diagnostic test results will improve Outcome: Progressing   Problem: Activity: Goal: Risk for activity intolerance will decrease Outcome: Progressing   

## 2021-03-04 NOTE — Progress Notes (Signed)
Subjective: Patient reports feeling a little better than yesterday. Still low grade fever. No abnormal back pain, complaining of more neck pain than anything   Objective: Vital signs in last 24 hours: Temp:  [98.8 F (37.1 C)-101.6 F (38.7 C)] 99.1 F (37.3 C) (04/18 1237) Pulse Rate:  [91-127] 99 (04/18 1237) Resp:  [14-22] 20 (04/18 1237) BP: (112-140)/(65-91) 123/75 (04/18 1237) SpO2:  [94 %-100 %] 96 % (04/18 1237) Weight:  [84.8 kg] 84.8 kg (04/17 1818)  Intake/Output from previous day: 04/17 0701 - 04/18 0700 In: 1790 [P.O.:100; I.V.:240; IV Piggyback:1450] Out: 550 [Urine:550] Intake/Output this shift: Total I/O In: 240 [P.O.:240] Out: -   Neurologic: Grossly normal  Lab Results: Lab Results  Component Value Date   WBC 6.5 03/04/2021   HGB 12.3 03/04/2021   HCT 37.4 03/04/2021   MCV 94.2 03/04/2021   PLT 238 03/04/2021   Lab Results  Component Value Date   INR 1.0 03/03/2021   BMET Lab Results  Component Value Date   NA 134 (L) 03/03/2021   K 4.2 03/03/2021   CL 96 (L) 03/03/2021   CO2 27 03/03/2021   GLUCOSE 125 (H) 03/03/2021   BUN 14 03/03/2021   CREATININE 0.99 03/03/2021   CALCIUM 9.2 03/03/2021    Studies/Results: DG Shoulder Right  Result Date: 03/03/2021 CLINICAL DATA:  Right shoulder pain EXAM: RIGHT SHOULDER - 2+ VIEW COMPARISON:  None. FINDINGS: Degenerative changes in the right [facility] and glenohumeral joints with joint space narrowing and spurring. No acute bony abnormality. Specifically, no fracture, subluxation, or dislocation. IMPRESSION: Moderate degenerative changes.  No acute bony abnormality. Electronically Signed   By: Charlett Nose M.D.   On: 03/03/2021 20:08   MR Lumbar Spine W Wo Contrast  Result Date: 03/04/2021 CLINICAL DATA:  Initial evaluation for acute fever, recent surgery. EXAM: MRI LUMBAR SPINE WITHOUT AND WITH CONTRAST TECHNIQUE: Multiplanar and multiecho pulse sequences of the lumbar spine were obtained without and with  intravenous contrast. CONTRAST:  60mL GADAVIST GADOBUTROL 1 MMOL/ML IV SOLN COMPARISON:  Previous MRI from 12/06/2017. FINDINGS: Segmentation: Standard. Lowest well-formed disc space labeled the L5-S1 level. Alignment: Mild levoscoliosis. Alignment otherwise normal with preservation of the normal lumbar lordosis. No listhesis. Vertebrae: Susceptibility artifact from prior PLIF seen at L1-2 through L5-S1, with recent postoperative changes at the L1-2 level. Hardware appears grossly well position without visible complication. Vertebral body height maintained without acute or chronic fracture. Bone marrow signal intensity within normal limits. No discrete or worrisome osseous lesions. Discogenic reactive endplate changes noted about the T11-12 and L1-2 interspaces. No other abnormal marrow edema or enhancement. No findings to suggest osteomyelitis discitis or septic arthritis. Conus medullaris and cauda equina: Conus extends to the L1 level. Conus and cauda equina appear normal. Paraspinal and other soft tissues: Changes related to recent surgery present within the posterior paraspinous soft tissues at the level of L1-2. Associated soft tissue edema seen throughout the adjacent lower posterior paraspinous musculature. Small fluid collections are seen at the posterior decompression site of L2 (series 8, image 9). Slight extension along the lower midline incision (series 8, image 3). Overall, the largest collection at the right posterolateral aspect of the thecal sac measures approximately 1.6 x 1.4 x 2.1 cm (series 8, image 9). An additional smaller collection seen at the contralateral left posterolateral aspect of the thecal sac and measures 1.9 x 1.6 x 2.2 cm. Persistent foci of postoperative pneumocephalus. Findings favored to reflect postoperative seromas. Superimposed infection difficult to exclude, and could  be considered in the correct clinical setting. Remainder of the paraspinous soft tissues otherwise within  normal limits. Few small benign-appearing nonenhancing cystic lesions noted within the kidneys bilaterally. Visualized visceral structures otherwise unremarkable. Disc levels: T11-12: Degenerative intervertebral disc space narrowing with disc desiccation and mild disc bulge. Mild facet hypertrophy. No significant stenosis. T12-L1: Degenerative intervertebral disc space narrowing with diffuse disc bulge and disc desiccation. Mild facet hypertrophy. No significant spinal stenosis. Foramina remain patent. L1-2: Sequelae of recent posterior and interbody fusion with wide posterior decompression. Probable bilateral partial facetectomies and foraminotomies. Small residual postoperative collections as above. No more than mild residual spinal stenosis. Foramina appear patent bilaterally. L2-3: Prior posterior and interbody fusion. No residual spinal stenosis. Foramina appear patent. L3-4: Prior posterior and interbody fusion. Mild facet hypertrophy. No significant residual spinal stenosis. Foramina remain patent. L4-5: Prior posterior and interbody fusion. Mild residual facet hypertrophy. No significant residual spinal stenosis. L5-S1: Prior posterior and interbody fusion. Residual left subarticular osseous ridging indents the left ventral thecal sac and closely approximates the descending left S1 nerve root (series 8, image 34). The descending left S1 nerve root is slightly displaced posteriorly. Mild left lateral recess stenosis. Central canal remains patent. Foramina appear grossly patent as well. IMPRESSION: 1. Changes related to recent posterior and interbody fusion with posterior decompression at L1-2. Small residual postoperative collections at the posterior decompression site as above, felt to be most consistent with small postoperative seromas. Superimposed infection difficult to exclude, and could be considered in the correct clinical setting. 2. Additional extensive postoperative changes from prior PLIF  throughout the remainder of the lumbar spine. Residual left subarticular osseous ridging at L5-S1 closely approximates and could potentially affect the descending left S1 nerve root. No other significant residual stenosis or evidence for neural impingement. Electronically Signed   By: Rise Mu M.D.   On: 03/04/2021 00:49   DG Chest Portable 1 View  Result Date: 03/03/2021 CLINICAL DATA:  Fever. EXAM: PORTABLE CHEST 1 VIEW COMPARISON:  02/25/2021 FINDINGS: Cervical and thoracic spine fixation. Midline trachea. Normal heart size for level of inspiration. Apical lordotic positioning. No pleural effusion or pneumothorax. No congestive failure. Clear lungs. IMPRESSION: No acute cardiopulmonary disease. Electronically Signed   By: Jeronimo Greaves M.D.   On: 03/03/2021 19:01    Assessment/Plan: S/p PLIF from 1 week ago. Came in with fever of unknown origin. Workup so far as been benign. Will order lower extremity dopplers to rule out DVTs.    LOS: 0 days    [name] 03/04/2021, 3:12 PM

## 2021-03-04 NOTE — H&P (Signed)
Angel RungSusan Schedler is an 75 y.o. female.   Chief Complaint: fever, chills HPI: [name] today felt chills, took her temperature and found it to be greater than 101. [name] came to the ED where a sepsis workup was initiated. Antibiotics were started, cefipime, and vancomycin. [name] denies pain, no back pain other than post op discomfort. [name] denies wound drainage. Dressing was intact and dry. Underwent 1. Decompressive lumbar laminectomy hemifacetectomy foraminotomies L1-2 requiring more work than would be required for a simple exposure of the disk for PLIF in order to adequately decompress the neural elements and address the spinal stenosis 2. Posterior lumbar interbody fusion L1-2 using PTI interbody cages packed with morcellized allograft and autograft  3. Posterior fixation L1-3 using [employer] pedicle screws.  4. Intertransverse arthrodesis L1-2 using morcellized autograft and allograft. 5.  Removal of segmental instrumentation L2-4 on 02/25/2021 and was discharged on 02/26/2021.  Past Medical History:  Diagnosis Date  . Anxiety   . Arthritis   . Chronic kidney disease 2019   ARF while in hospital for PLIF  . Depression   . GERD (gastroesophageal reflux disease)   . Hypertension   . Hypothyroidism   . Macular degeneration of both eyes    not age related  . PONV (postoperative nausea and vomiting)    also is slow to wake up    Past Surgical History:  Procedure Laterality Date  . ABDOMINAL HYSTERECTOMY     total  . ANTERIOR CERVICAL DISCECTOMY    . APPENDECTOMY    . BACK SURGERY     PLIF  . BUNIONECTOMY Right   . CHOLECYSTECTOMY    . COLONOSCOPY    . HAND SURGERY Left    thumb joint, carpal tunnel and cyst  . HAND SURGERY Right    thumb and carpal tunnel  . POSTERIOR CERVICAL FUSION/FORAMINOTOMY      Family History  Problem Relation Age of Onset  . Breast cancer Neg Hx    Social History:  reports that [name] has never smoked. [name] has never used smokeless tobacco. [name]  reports that [name] does not drink alcohol and does not use drugs.  Allergies:  Allergies  Allergen Reactions  . Celecoxib     Other reaction(s): itching  . Citalopram Hydrobromide     Other reaction(s): nausea  . Codeine Nausea Only    Severe headache  . Latex     Other reaction(s): itching----GLOVES  . Meperidine Hcl     Other reaction(s): itching  . Morphine And Related Nausea And Vomiting    And headaches  . Tape Itching  . Trazodone Hcl     Other reaction(s): nightmares    (Not in a hospital admission)   Results for orders placed or performed during the hospital encounter of 03/03/21 (from the past 48 hour(s))  Comprehensive metabolic panel     Status: Abnormal   Collection Time: 03/03/21  5:57 PM  Result Value Ref Range   Sodium 134 (L) 135 - 145 mmol/L   Potassium 4.2 3.5 - 5.1 mmol/L   Chloride 96 (L) 98 - 111 mmol/L   CO2 27 22 - 32 mmol/L   Glucose, Bld 125 (H) 70 - 99 mg/dL    Comment: Glucose reference range applies only to samples taken after fasting for at least 8 hours.   BUN 14 8 - 23 mg/dL   Creatinine, Ser 1.610.99 0.44 - 1.00 mg/dL   Calcium 9.2 8.9 - 09.610.3 mg/dL   Total Protein 7.5 6.5 -  8.1 g/dL   Albumin 3.6 3.5 - 5.0 g/dL   AST 26 15 - 41 U/L   ALT 25 0 - 44 U/L   Alkaline Phosphatase 85 38 - 126 U/L   Total Bilirubin 0.8 0.3 - 1.2 mg/dL   GFR, Estimated >37 >16 mL/min    Comment: (NOTE) Calculated using the CKD-EPI Creatinine Equation (2021)    Anion gap 11 5 - 15    Comment: Performed at [facility], [address] [place], [place] [address]  Lactic acid, plasma     Status: None   Collection Time: 03/03/21  5:57 PM  Result Value Ref Range   Lactic Acid, Venous 1.7 0.5 - 1.9 mmol/L    Comment: Performed at [facility], [address] [place], [place] [address]  CBC with Differential     Status: Abnormal   Collection Time: 03/03/21  5:57 PM  Result Value Ref Range   WBC 11.8 (H) 4.0 - 10.5 K/uL   RBC 4.54 3.87 - 5.11 MIL/uL    Hemoglobin 14.2 12.0 - 15.0 g/dL   HCT 75.1 02.5 - 85.2 %   MCV 94.5 80.0 - 100.0 fL   MCH 31.3 26.0 - 34.0 pg   MCHC 33.1 30.0 - 36.0 g/dL   RDW 77.8 24.2 - 35.3 %   Platelets 351 150 - 400 K/uL   nRBC 0.0 0.0 - 0.2 %   Neutrophils Relative % 81 %   Neutro Abs 9.6 (H) 1.7 - 7.7 K/uL   Lymphocytes Relative 9 %   Lymphs Abs 1.0 0.7 - 4.0 K/uL   Monocytes Relative 6 %   Monocytes Absolute 0.7 0.1 - 1.0 K/uL   Eosinophils Relative 3 %   Eosinophils Absolute 0.3 0.0 - 0.5 K/uL   Basophils Relative 0 %   Basophils Absolute 0.0 0.0 - 0.1 K/uL   Immature Granulocytes 1 %   Abs Immature Granulocytes 0.07 0.00 - 0.07 K/uL    Comment: Performed at [facility], [address] [place], [place] [address]  Protime-INR     Status: None   Collection Time: 03/03/21  5:57 PM  Result Value Ref Range   Prothrombin Time 12.7 11.4 - 15.2 seconds   INR 1.0 0.8 - 1.2    Comment: (NOTE) INR goal varies based on device and disease states. Performed at [facility], [address], [place], [place] [address]   Lipase, blood     Status: None   Collection Time: 03/03/21  6:16 PM  Result Value Ref Range   Lipase 32 11 - 51 U/L    Comment: Performed at [facility], [address] [place], [place] [address]  Resp Panel by RT-PCR (Flu A&B, Covid) Urine, Clean Catch     Status: None   Collection Time: 03/03/21  6:16 PM   Specimen: Urine, Clean Catch; Nasopharyngeal(NP) swabs in vial transport medium  Result Value Ref Range   SARS Coronavirus 2 by RT PCR NEGATIVE NEGATIVE    Comment: (NOTE) SARS-CoV-2 target nucleic acids are NOT DETECTED.  The SARS-CoV-2 RNA is generally detectable in upper respiratory specimens during the acute phase of infection. The lowest concentration of SARS-CoV-2 viral copies this assay can detect is 138 copies/mL. A negative result does not preclude SARS-Cov-2 infection and should not be used as the sole basis for treatment or other patient management  decisions. A negative result may occur with  improper specimen collection/handling, submission of specimen other  than nasopharyngeal swab, presence of viral mutation(s) within the areas targeted by this assay, and inadequate number of viral copies(<138 copies/mL). A negative result must be combined with clinical observations, patient history, and epidemiological information. The expected result is Negative.  Fact Sheet for Patients:  BloggerCourse.com  Fact Sheet for Healthcare Providers:  SeriousBroker.it  This test is no t yet approved or cleared by the [place] FDA and  has been authorized for detection and/or diagnosis of SARS-CoV-2 by FDA under an Emergency Use Authorization (EUA). This EUA will remain  in effect (meaning this test can be used) for the duration of the COVID-19 declaration under Section 564(b)(1) of the Act, 21 U.S.C.section 360bbb-3(b)(1), unless the authorization is terminated  or revoked sooner.       Influenza A by PCR NEGATIVE NEGATIVE   Influenza B by PCR NEGATIVE NEGATIVE    Comment: (NOTE) The Xpert Xpress SARS-CoV-2/FLU/RSV plus assay is intended as an aid in the diagnosis of influenza from Nasopharyngeal swab specimens and should not be used as a sole basis for treatment. Nasal washings and aspirates are unacceptable for Xpert Xpress SARS-CoV-2/FLU/RSV testing.  Fact Sheet for Patients: BloggerCourse.com  Fact Sheet for Healthcare Providers: SeriousBroker.it  This test is not yet approved or cleared by the [place] FDA and has been authorized for detection and/or diagnosis of SARS-CoV-2 by FDA under an Emergency Use Authorization (EUA). This EUA will remain in effect (meaning this test can be used) for the duration of the COVID-19 declaration under Section 564(b)(1) of the Act, 21 U.S.C. section 360bbb-3(b)(1), unless the authorization  is terminated or revoked.  Performed at [facility], [address], [place], [place] [address]   APTT     Status: None   Collection Time: 03/03/21  6:16 PM  Result Value Ref Range   aPTT 28 24 - 36 seconds    Comment: Performed at [facility], [address], [place], [place] [address]  Urinalysis, Routine w reflex microscopic Urine, Clean Catch     Status: Abnormal   Collection Time: 03/03/21  8:40 PM  Result Value Ref Range   Color, Urine YELLOW YELLOW   APPearance CLEAR CLEAR   Specific Gravity, Urine 1.009 1.005 - 1.030   pH 9.0 (H) 5.0 - 8.0   Glucose, UA NEGATIVE NEGATIVE mg/dL   Hgb urine dipstick NEGATIVE NEGATIVE   Bilirubin Urine NEGATIVE NEGATIVE   Ketones, ur NEGATIVE NEGATIVE mg/dL   Protein, ur NEGATIVE NEGATIVE mg/dL   Nitrite NEGATIVE NEGATIVE   Leukocytes,Ua NEGATIVE NEGATIVE    Comment: Performed at [facility], [address] [place], [place] [address]   DG Shoulder Right  Result Date: 03/03/2021 CLINICAL DATA:  Right shoulder pain EXAM: RIGHT SHOULDER - 2+ VIEW COMPARISON:  None. FINDINGS: Degenerative changes in the right [facility] and glenohumeral joints with joint space narrowing and spurring. No acute bony abnormality. Specifically, no fracture, subluxation, or dislocation. IMPRESSION: Moderate degenerative changes.  No acute bony abnormality. Electronically Signed   By: Charlett Nose M.D.   On: 03/03/2021 20:08   MR Lumbar Spine W Wo Contrast  Result Date: 03/04/2021 CLINICAL DATA:  Initial evaluation for acute fever, recent surgery. EXAM: MRI LUMBAR SPINE WITHOUT AND WITH CONTRAST TECHNIQUE: Multiplanar and multiecho pulse sequences of the lumbar spine were obtained without and with intravenous contrast. CONTRAST:  5mL GADAVIST GADOBUTROL 1 MMOL/ML IV SOLN COMPARISON:  Previous MRI from 12/06/2017. FINDINGS: Segmentation: Standard. Lowest well-formed disc space labeled the L5-S1 level.  Alignment: Mild levoscoliosis. Alignment otherwise normal  with preservation of the normal lumbar lordosis. No listhesis. Vertebrae: Susceptibility artifact from prior PLIF seen at L1-2 through L5-S1, with recent postoperative changes at the L1-2 level. Hardware appears grossly well position without visible complication. Vertebral body height maintained without acute or chronic fracture. Bone marrow signal intensity within normal limits. No discrete or worrisome osseous lesions. Discogenic reactive endplate changes noted about the T11-12 and L1-2 interspaces. No other abnormal marrow edema or enhancement. No findings to suggest osteomyelitis discitis or septic arthritis. Conus medullaris and cauda equina: Conus extends to the L1 level. Conus and cauda equina appear normal. Paraspinal and other soft tissues: Changes related to recent surgery present within the posterior paraspinous soft tissues at the level of L1-2. Associated soft tissue edema seen throughout the adjacent lower posterior paraspinous musculature. Small fluid collections are seen at the posterior decompression site of L2 (series 8, image 9). Slight extension along the lower midline incision (series 8, image 3). Overall, the largest collection at the right posterolateral aspect of the thecal sac measures approximately 1.6 x 1.4 x 2.1 cm (series 8, image 9). An additional smaller collection seen at the contralateral left posterolateral aspect of the thecal sac and measures 1.9 x 1.6 x 2.2 cm. Persistent foci of postoperative pneumocephalus. Findings favored to reflect postoperative seromas. Superimposed infection difficult to exclude, and could be considered in the correct clinical setting. Remainder of the paraspinous soft tissues otherwise within normal limits. Few small benign-appearing nonenhancing cystic lesions noted within the kidneys bilaterally. Visualized visceral structures otherwise unremarkable. Disc levels: T11-12: Degenerative intervertebral disc space narrowing with disc desiccation and mild  disc bulge. Mild facet hypertrophy. No significant stenosis. T12-L1: Degenerative intervertebral disc space narrowing with diffuse disc bulge and disc desiccation. Mild facet hypertrophy. No significant spinal stenosis. Foramina remain patent. L1-2: Sequelae of recent posterior and interbody fusion with wide posterior decompression. Probable bilateral partial facetectomies and foraminotomies. Small residual postoperative collections as above. No more than mild residual spinal stenosis. Foramina appear patent bilaterally. L2-3: Prior posterior and interbody fusion. No residual spinal stenosis. Foramina appear patent. L3-4: Prior posterior and interbody fusion. Mild facet hypertrophy. No significant residual spinal stenosis. Foramina remain patent. L4-5: Prior posterior and interbody fusion. Mild residual facet hypertrophy. No significant residual spinal stenosis. L5-S1: Prior posterior and interbody fusion. Residual left subarticular osseous ridging indents the left ventral thecal sac and closely approximates the descending left S1 nerve root (series 8, image 34). The descending left S1 nerve root is slightly displaced posteriorly. Mild left lateral recess stenosis. Central canal remains patent. Foramina appear grossly patent as well. IMPRESSION: 1. Changes related to recent posterior and interbody fusion with posterior decompression at L1-2. Small residual postoperative collections at the posterior decompression site as above, felt to be most consistent with small postoperative seromas. Superimposed infection difficult to exclude, and could be considered in the correct clinical setting. 2. Additional extensive postoperative changes from prior PLIF throughout the remainder of the lumbar spine. Residual left subarticular osseous ridging at L5-S1 closely approximates and could potentially affect the descending left S1 nerve root. No other significant residual stenosis or evidence for neural impingement. Electronically  Signed   By: Rise Mu M.D.   On: 03/04/2021 00:49   DG Chest Portable 1 View  Result Date: 03/03/2021 CLINICAL DATA:  Fever. EXAM: PORTABLE CHEST 1 VIEW COMPARISON:  02/25/2021 FINDINGS: Cervical and thoracic spine fixation. Midline trachea. Normal heart size for level of inspiration. Apical lordotic positioning. No pleural  effusion or pneumothorax. No congestive failure. Clear lungs. IMPRESSION: No acute cardiopulmonary disease. Electronically Signed   By: Jeronimo Greaves M.D.   On: 03/03/2021 19:01    Review of Systems  Constitutional: Positive for chills and fever.  HENT: Negative.   Eyes: Negative.   Respiratory: Negative.   Cardiovascular: Negative.   Gastrointestinal: Negative.   Endocrine: Negative.   Genitourinary: Negative.   Musculoskeletal: Positive for neck pain.  Skin: Negative.   Allergic/Immunologic: Negative.   Neurological: Negative.   Hematological: Negative.   Psychiatric/Behavioral: Negative.     Blood pressure 122/75, pulse 99, temperature (!) 101.6 F (38.7 C), resp. rate 19, height  (1.651 m), weight 84.8 kg, SpO2 94 %. Physical Exam Constitutional:      General: [name] is not in acute distress.    Appearance: Normal appearance. [name] is not ill-appearing, toxic-appearing or diaphoretic.  HENT:     Head: Normocephalic and atraumatic.     Right Ear: Tympanic membrane normal.     Left Ear: Tympanic membrane normal.     Nose: Nose normal.     Mouth/Throat:     Mouth: Mucous membranes are moist.     Pharynx: Oropharynx is clear.  Eyes:     Extraocular Movements: Extraocular movements intact.     Conjunctiva/sclera: Conjunctivae normal.     Pupils: Pupils are equal, round, and reactive to light.  Cardiovascular:     Rate and Rhythm: Normal rate and regular rhythm.     Pulses: Normal pulses.     Heart sounds: Normal heart sounds.  Pulmonary:     Effort: Pulmonary effort is normal.  Abdominal:     General: Abdomen is flat. Bowel sounds are  normal.     Palpations: Abdomen is soft.  Musculoskeletal:        General: Normal range of motion.     Cervical back: Normal range of motion and neck supple.  Skin:    General: Skin is warm and dry.     Comments: Wound with ecchymotic areas, no induration, swelling, drainage. No evidence of wound infection.  Neurological:     General: No focal deficit present.     Mental Status: [name] is alert and oriented to person, place, and time. Mental status is at baseline.     Cranial Nerves: No cranial nerve deficit.     Sensory: No sensory deficit.     Motor: No weakness.     Coordination: Coordination normal.     Deep Tendon Reflexes: Reflexes normal.  Psychiatric:        Mood and Affect: Mood normal.        Behavior: Behavior normal.        Thought Content: Thought content normal.        Judgment: Judgment normal.      Assessment/Plan Mikylah Ackroyd is a 75 y.o. female With a fever of unknown origin. There is no evidence of a wound infection. [name] has not had increased pain in the wound, nor drainage. Will admit for continuation of the antibiotics and observation. White count was slightly elevated in the ED. Will repeat in the am.   Coletta Memos, MD 03/04/2021, 2:09 AM

## 2021-03-05 ENCOUNTER — Inpatient Hospital Stay (HOSPITAL_COMMUNITY): Payer: Medicare Other

## 2021-03-05 DIAGNOSIS — R509 Fever, unspecified: Secondary | ICD-10-CM

## 2021-03-05 MED ORDER — ALUM & MAG HYDROXIDE-SIMETH 200-200-20 MG/5ML PO SUSP
30.0000 mL | Freq: Four times a day (QID) | ORAL | Status: DC | PRN
  Administered 2021-03-05: 30 mL via ORAL
  Filled 2021-03-05: qty 30

## 2021-03-05 MED ORDER — LOPERAMIDE HCL 2 MG PO CAPS
4.0000 mg | ORAL_CAPSULE | ORAL | Status: DC | PRN
  Administered 2021-03-05: 4 mg via ORAL
  Filled 2021-03-05 (×2): qty 2

## 2021-03-05 NOTE — Plan of Care (Signed)
  Problem: Education: Goal: Knowledge of General Education information will improve Description: Including pain rating scale, medication(s)/side effects and non-pharmacologic comfort measures Outcome: Progressing   Problem: Health Behavior/Discharge Planning: Goal: Ability to manage health-related needs will improve Outcome: Progressing   Problem: Clinical Measurements: Goal: Will remain free from infection Outcome: Completed/Met   Problem: Activity: Goal: Risk for activity intolerance will decrease Outcome: Completed/Met

## 2021-03-05 NOTE — Progress Notes (Signed)
Subjective: Patient reports no back pain, no leg pain, had emesis 4x last night, feels better this am  Objective: Vital signs in last 24 hours: Temp:  [97.9 F (36.6 C)-100.2 F (37.9 C)] 97.9 F (36.6 C) (04/19 0757) Pulse Rate:  [80-99] 80 (04/19 0757) Resp:  [16-20] 16 (04/19 0757) BP: (109-133)/(71-82) 122/77 (04/19 0757) SpO2:  [96 %-99 %] 99 % (04/19 0757)  Intake/Output from previous day: 04/18 0701 - 04/19 0700 In: 1917.6 [P.O.:240; I.V.:1677.6] Out: 750 [Urine:750] Intake/Output this shift: No intake/output data recorded.  Neurologic: Grossly normal GI: soft, maybe a little tender Wound:CDI with no signs of infection  Lab Results: Lab Results  Component Value Date   WBC 6.5 03/04/2021   HGB 12.3 03/04/2021   HCT 37.4 03/04/2021   MCV 94.2 03/04/2021   PLT 238 03/04/2021   Lab Results  Component Value Date   INR 1.0 03/03/2021   BMET Lab Results  Component Value Date   NA 134 (L) 03/03/2021   K 4.2 03/03/2021   CL 96 (L) 03/03/2021   CO2 27 03/03/2021   GLUCOSE 125 (H) 03/03/2021   BUN 14 03/03/2021   CREATININE 0.99 03/03/2021   CALCIUM 9.2 03/03/2021    Studies/Results: DG Shoulder Right  Result Date: 03/03/2021 CLINICAL DATA:  Right shoulder pain EXAM: RIGHT SHOULDER - 2+ VIEW COMPARISON:  None. FINDINGS: Degenerative changes in the right [facility] and glenohumeral joints with joint space narrowing and spurring. No acute bony abnormality. Specifically, no fracture, subluxation, or dislocation. IMPRESSION: Moderate degenerative changes.  No acute bony abnormality. Electronically Signed   By: Charlett Nose M.D.   On: 03/03/2021 20:08   MR Lumbar Spine W Wo Contrast  Result Date: 03/04/2021 CLINICAL DATA:  Initial evaluation for acute fever, recent surgery. EXAM: MRI LUMBAR SPINE WITHOUT AND WITH CONTRAST TECHNIQUE: Multiplanar and multiecho pulse sequences of the lumbar spine were obtained without and with intravenous contrast. CONTRAST:  90mL GADAVIST  GADOBUTROL 1 MMOL/ML IV SOLN COMPARISON:  Previous MRI from 12/06/2017. FINDINGS: Segmentation: Standard. Lowest well-formed disc space labeled the L5-S1 level. Alignment: Mild levoscoliosis. Alignment otherwise normal with preservation of the normal lumbar lordosis. No listhesis. Vertebrae: Susceptibility artifact from prior PLIF seen at L1-2 through L5-S1, with recent postoperative changes at the L1-2 level. Hardware appears grossly well position without visible complication. Vertebral body height maintained without acute or chronic fracture. Bone marrow signal intensity within normal limits. No discrete or worrisome osseous lesions. Discogenic reactive endplate changes noted about the T11-12 and L1-2 interspaces. No other abnormal marrow edema or enhancement. No findings to suggest osteomyelitis discitis or septic arthritis. Conus medullaris and cauda equina: Conus extends to the L1 level. Conus and cauda equina appear normal. Paraspinal and other soft tissues: Changes related to recent surgery present within the posterior paraspinous soft tissues at the level of L1-2. Associated soft tissue edema seen throughout the adjacent lower posterior paraspinous musculature. Small fluid collections are seen at the posterior decompression site of L2 (series 8, image 9). Slight extension along the lower midline incision (series 8, image 3). Overall, the largest collection at the right posterolateral aspect of the thecal sac measures approximately 1.6 x 1.4 x 2.1 cm (series 8, image 9). An additional smaller collection seen at the contralateral left posterolateral aspect of the thecal sac and measures 1.9 x 1.6 x 2.2 cm. Persistent foci of postoperative pneumocephalus. Findings favored to reflect postoperative seromas. Superimposed infection difficult to exclude, and could be considered in the correct clinical setting. Remainder of  the paraspinous soft tissues otherwise within normal limits. Few small benign-appearing  nonenhancing cystic lesions noted within the kidneys bilaterally. Visualized visceral structures otherwise unremarkable. Disc levels: T11-12: Degenerative intervertebral disc space narrowing with disc desiccation and mild disc bulge. Mild facet hypertrophy. No significant stenosis. T12-L1: Degenerative intervertebral disc space narrowing with diffuse disc bulge and disc desiccation. Mild facet hypertrophy. No significant spinal stenosis. Foramina remain patent. L1-2: Sequelae of recent posterior and interbody fusion with wide posterior decompression. Probable bilateral partial facetectomies and foraminotomies. Small residual postoperative collections as above. No more than mild residual spinal stenosis. Foramina appear patent bilaterally. L2-3: Prior posterior and interbody fusion. No residual spinal stenosis. Foramina appear patent. L3-4: Prior posterior and interbody fusion. Mild facet hypertrophy. No significant residual spinal stenosis. Foramina remain patent. L4-5: Prior posterior and interbody fusion. Mild residual facet hypertrophy. No significant residual spinal stenosis. L5-S1: Prior posterior and interbody fusion. Residual left subarticular osseous ridging indents the left ventral thecal sac and closely approximates the descending left S1 nerve root (series 8, image 34). The descending left S1 nerve root is slightly displaced posteriorly. Mild left lateral recess stenosis. Central canal remains patent. Foramina appear grossly patent as well. IMPRESSION: 1. Changes related to recent posterior and interbody fusion with posterior decompression at L1-2. Small residual postoperative collections at the posterior decompression site as above, felt to be most consistent with small postoperative seromas. Superimposed infection difficult to exclude, and could be considered in the correct clinical setting. 2. Additional extensive postoperative changes from prior PLIF throughout the remainder of the lumbar spine.  Residual left subarticular osseous ridging at L5-S1 closely approximates and could potentially affect the descending left S1 nerve root. No other significant residual stenosis or evidence for neural impingement. Electronically Signed   By: Rise Mu M.D.   On: 03/04/2021 00:49   DG Chest Portable 1 View  Result Date: 03/03/2021 CLINICAL DATA:  Fever. EXAM: PORTABLE CHEST 1 VIEW COMPARISON:  02/25/2021 FINDINGS: Cervical and thoracic spine fixation. Midline trachea. Normal heart size for level of inspiration. Apical lordotic positioning. No pleural effusion or pneumothorax. No congestive failure. Clear lungs. IMPRESSION: No acute cardiopulmonary disease. Electronically Signed   By: Jeronimo Greaves M.D.   On: 03/03/2021 19:01    Assessment/Plan: Seems most likely viral etiology given fever with emesis and diarrhea. Nurse states 4-5 people on this floor had the same syndrome yesterday.  R/o C diff. continuie hydration, await LE dopplers to r/o DVT, continue sq hep.  Estimated body mass index is 31.11 kg/m as calculated from the following:   Height as of this encounter: 5\' 5"  (1.651 m).   Weight as of this encounter: 84.8 kg.    LOS: 1 day    03/05/2021, 8:02 AM

## 2021-03-05 NOTE — Plan of Care (Signed)
  Problem: Clinical Measurements: Goal: Will remain free from infection Outcome: Adequate for Discharge Goal: Diagnostic test results will improve Outcome: Completed/Met Goal: Respiratory complications will improve Outcome: Completed/Met

## 2021-03-05 NOTE — Progress Notes (Signed)
Bilateral lower extremity venous duplex has been completed. Preliminary results can be found in CV Proc through chart review.   03/05/21 1:44 PM Olen Cordial RVT

## 2021-03-05 NOTE — Plan of Care (Signed)
  Problem: Education: Goal: Knowledge of General Education information will improve Description: Including pain rating scale, medication(s)/side effects and non-pharmacologic comfort measures Outcome: Progressing   Problem: Health Behavior/Discharge Planning: Goal: Ability to manage health-related needs will improve Outcome: Progressing   Problem: Clinical Measurements: Goal: Ability to maintain clinical measurements within normal limits will improve Outcome: Progressing Goal: Will remain free from infection Outcome: Progressing Goal: Cardiovascular complication will be avoided Outcome: Progressing   Problem: Activity: Goal: Risk for activity intolerance will decrease Outcome: Progressing   Problem: Nutrition: Goal: Adequate nutrition will be maintained Outcome: Progressing   Problem: Coping: Goal: Level of anxiety will decrease Outcome: Progressing   Problem: Elimination: Goal: Will not experience complications related to bowel motility Outcome: Progressing Goal: Will not experience complications related to urinary retention Outcome: Progressing   Problem: Pain Managment: Goal: General experience of comfort will improve Outcome: Progressing   Problem: Safety: Goal: Ability to remain free from injury will improve Outcome: Progressing   Problem: Skin Integrity: Goal: Risk for impaired skin integrity will decrease Outcome: Progressing   Problem: Fluid Volume: Goal: Hemodynamic stability will improve Outcome: Progressing   Problem: Clinical Measurements: Goal: Diagnostic test results will improve Outcome: Progressing Goal: Signs and symptoms of infection will decrease Outcome: Progressing   Problem: Respiratory: Goal: Ability to maintain adequate ventilation will improve Outcome: Progressing

## 2021-03-06 LAB — URINE CULTURE: Culture: 30000 — AB

## 2021-03-06 NOTE — Discharge Summary (Signed)
Physician Discharge Summary  Patient ID: [name] MRN: [id number] DOB/AGE: [birth year] 75 y.o.  Admit date: 03/03/2021 Discharge date: 03/06/2021  Admission Diagnoses: fever    Discharge Diagnoses: viral syndrome   Discharged Condition: good  Hospital Course: The patient was admitted on 03/03/2021 with FUO. CXR clear, wound clean. MRI lumbar showed expected post-op- changes. The following day she had diarrhea and emesisx4. After that her fever broke and she remained afebrile. The wound remained clean dry and intact. Pt had appropriate mild back soreness. LE dopplers neg for DVT. U/A showed diphtheroids but ID Daiva Eves) recommended no treatment.  No complaints of leg pain or new N/T/W. The patient remained afebrile with stable vital signs, and tolerated a regular diet. The patient continued to increase activities, and pain was well controlled with oral pain medications.   Consults: None  Significant Diagnostic Studies:  Results for orders placed or performed during the hospital encounter of 03/03/21  Culture, blood (Routine x 2)   Specimen: BLOOD  Result Value Ref Range   Specimen Description BLOOD RIGHT ANTECUBITAL    Special Requests      BOTTLES DRAWN AEROBIC AND ANAEROBIC Blood Culture results may not be optimal due to an excessive volume of blood received in culture bottles   Culture      NO GROWTH 3 DAYS Performed at [facility], [address], [place], [place] [address]    Report Status PENDING   Culture, blood (Routine x 2)   Specimen: BLOOD RIGHT HAND  Result Value Ref Range   Specimen Description BLOOD RIGHT HAND    Special Requests      BOTTLES DRAWN AEROBIC AND ANAEROBIC Blood Culture results may not be optimal due to an inadequate volume of blood received in culture bottles   Culture      NO GROWTH 3 DAYS Performed at [facility], [address] [place], [place] [address]    Report Status PENDING   Resp Panel by RT-PCR (Flu A&B, Covid) Urine,  Clean Catch   Specimen: Urine, Clean Catch; Nasopharyngeal(NP) swabs in vial transport medium  Result Value Ref Range   SARS Coronavirus 2 by RT PCR NEGATIVE NEGATIVE   Influenza A by PCR NEGATIVE NEGATIVE   Influenza B by PCR NEGATIVE NEGATIVE  Urine culture   Specimen: Urine, Clean Catch  Result Value Ref Range   Specimen Description URINE, CLEAN CATCH    Special Requests NONE    Culture (A)     30,000 COLONIES/mL ENTEROCOCCUS FAECALIS >=100,000 COLONIES/mL DIPHTHEROIDS(CORYNEBACTERIUM SPECIES) Standardized susceptibility testing for this organism is not available. Performed at [facility], [address], [place], [place] [address]    Report Status 03/06/2021 FINAL    Organism ID, Bacteria ENTEROCOCCUS FAECALIS (A)       Susceptibility   Enterococcus faecalis - MIC*    AMPICILLIN <=2 SENSITIVE Sensitive     NITROFURANTOIN <=16 SENSITIVE Sensitive     VANCOMYCIN 2 SENSITIVE Sensitive     * 30,000 COLONIES/mL ENTEROCOCCUS FAECALIS  Comprehensive metabolic panel  Result Value Ref Range   Sodium 134 (L) 135 - 145 mmol/L   Potassium 4.2 3.5 - 5.1 mmol/L   Chloride 96 (L) 98 - 111 mmol/L   CO2 27 22 - 32 mmol/L   Glucose, Bld 125 (H) 70 - 99 mg/dL   BUN 14 8 - 23 mg/dL   Creatinine, Ser 4.25 0.44 - 1.00 mg/dL   Calcium 9.2 8.9 - 95.6 mg/dL   Total  Protein 7.5 6.5 - 8.1 g/dL   Albumin 3.6 3.5 - 5.0 g/dL   AST 26 15 - 41 U/L   ALT 25 0 - 44 U/L   Alkaline Phosphatase 85 38 - 126 U/L   Total Bilirubin 0.8 0.3 - 1.2 mg/dL   GFR, Estimated >69 >62 mL/min   Anion gap 11 5 - 15  Lactic acid, plasma  Result Value Ref Range   Lactic Acid, Venous 1.7 0.5 - 1.9 mmol/L  CBC with Differential  Result Value Ref Range   WBC 11.8 (H) 4.0 - 10.5 K/uL   RBC 4.54 3.87 - 5.11 MIL/uL   Hemoglobin 14.2 12.0 - 15.0 g/dL   HCT 95.2 84.1 - 32.4 %   MCV 94.5 80.0 - 100.0 fL   MCH 31.3 26.0 - 34.0 pg   MCHC 33.1 30.0 - 36.0 g/dL   RDW 40.1 02.7 - 25.3 %   Platelets 351 150 - 400 K/uL    nRBC 0.0 0.0 - 0.2 %   Neutrophils Relative % 81 %   Neutro Abs 9.6 (H) 1.7 - 7.7 K/uL   Lymphocytes Relative 9 %   Lymphs Abs 1.0 0.7 - 4.0 K/uL   Monocytes Relative 6 %   Monocytes Absolute 0.7 0.1 - 1.0 K/uL   Eosinophils Relative 3 %   Eosinophils Absolute 0.3 0.0 - 0.5 K/uL   Basophils Relative 0 %   Basophils Absolute 0.0 0.0 - 0.1 K/uL   Immature Granulocytes 1 %   Abs Immature Granulocytes 0.07 0.00 - 0.07 K/uL  Protime-INR  Result Value Ref Range   Prothrombin Time 12.7 11.4 - 15.2 seconds   INR 1.0 0.8 - 1.2  Urinalysis, Routine w reflex microscopic Urine, Clean Catch  Result Value Ref Range   Color, Urine YELLOW YELLOW   APPearance CLEAR CLEAR   Specific Gravity, Urine 1.009 1.005 - 1.030   pH 9.0 (H) 5.0 - 8.0   Glucose, UA NEGATIVE NEGATIVE mg/dL   Hgb urine dipstick NEGATIVE NEGATIVE   Bilirubin Urine NEGATIVE NEGATIVE   Ketones, ur NEGATIVE NEGATIVE mg/dL   Protein, ur NEGATIVE NEGATIVE mg/dL   Nitrite NEGATIVE NEGATIVE   Leukocytes,Ua NEGATIVE NEGATIVE  Lipase, blood  Result Value Ref Range   Lipase 32 11 - 51 U/L  APTT  Result Value Ref Range   aPTT 28 24 - 36 seconds  CBC  Result Value Ref Range   WBC 6.5 4.0 - 10.5 K/uL   RBC 3.97 3.87 - 5.11 MIL/uL   Hemoglobin 12.3 12.0 - 15.0 g/dL   HCT 66.4 40.3 - 47.4 %   MCV 94.2 80.0 - 100.0 fL   MCH 31.0 26.0 - 34.0 pg   MCHC 32.9 30.0 - 36.0 g/dL   RDW 25.9 56.3 - 87.5 %   Platelets 238 150 - 400 K/uL   nRBC 0.0 0.0 - 0.2 %  Lactic acid, plasma  Result Value Ref Range   Lactic Acid, Venous 1.0 0.5 - 1.9 mmol/L    Chest 2 View  Result Date: 02/25/2021 CLINICAL DATA:  75 year old female preoperative study for lumbar surgery. EXAM: CHEST - 2 VIEW COMPARISON:  Chest radiographs 10/17/2018 and earlier. FINDINGS: Anterior and posterior lower cervical and upper thoracic spine fusion hardware along with multilevel lumbar posterior and interbody fusion sequelae again noted. Widespread thoracic disc and  endplate degeneration. No acute osseous abnormality identified. Lung volumes and mediastinal contours are stable and within normal limits. Lung markings appear stable and within normal limits. No pneumothorax  or pleural effusion. Paucity of bowel gas. IMPRESSION: No acute cardiopulmonary abnormality. Electronically Signed   By: Odessa Fleming M.D.   On: 02/25/2021 07:20   DG Lumbar Spine 2-3 Views  Result Date: 02/25/2021 CLINICAL DATA:  Lumbar fusion extension. EXAM: LUMBAR SPINE - 2-3 VIEW; DG C-ARM 1-60 MIN FLUOROSCOPY TIME:  56 seconds. C-arm fluoroscopic images were obtained intraoperatively and submitted for post operative interpretation. COMPARISON:  MRI lumbar spine dated January 30, 2021. FINDINGS: AP and lateral intraoperative fluoroscopic images demonstrate PLIF extension to L1-L2. Additional hardware included in the field of view from L2-L4. No acute osseous abnormality. IMPRESSION: 1. Intraoperative fluoroscopic guidance for PLIF extension to L1-L2. Electronically Signed   By: Obie Dredge M.D.   On: 02/25/2021 14:57   DG Shoulder Right  Result Date: 03/03/2021 CLINICAL DATA:  Right shoulder pain EXAM: RIGHT SHOULDER - 2+ VIEW COMPARISON:  None. FINDINGS: Degenerative changes in the right [facility] and glenohumeral joints with joint space narrowing and spurring. No acute bony abnormality. Specifically, no fracture, subluxation, or dislocation. IMPRESSION: Moderate degenerative changes.  No acute bony abnormality. Electronically Signed   By: Charlett Nose M.D.   On: 03/03/2021 20:08   MR Lumbar Spine W Wo Contrast  Result Date: 03/04/2021 CLINICAL DATA:  Initial evaluation for acute fever, recent surgery. EXAM: MRI LUMBAR SPINE WITHOUT AND WITH CONTRAST TECHNIQUE: Multiplanar and multiecho pulse sequences of the lumbar spine were obtained without and with intravenous contrast. CONTRAST:  90mL GADAVIST GADOBUTROL 1 MMOL/ML IV SOLN COMPARISON:  Previous MRI from 12/06/2017. FINDINGS: Segmentation: Standard.  Lowest well-formed disc space labeled the L5-S1 level. Alignment: Mild levoscoliosis. Alignment otherwise normal with preservation of the normal lumbar lordosis. No listhesis. Vertebrae: Susceptibility artifact from prior PLIF seen at L1-2 through L5-S1, with recent postoperative changes at the L1-2 level. Hardware appears grossly well position without visible complication. Vertebral body height maintained without acute or chronic fracture. Bone marrow signal intensity within normal limits. No discrete or worrisome osseous lesions. Discogenic reactive endplate changes noted about the T11-12 and L1-2 interspaces. No other abnormal marrow edema or enhancement. No findings to suggest osteomyelitis discitis or septic arthritis. Conus medullaris and cauda equina: Conus extends to the L1 level. Conus and cauda equina appear normal. Paraspinal and other soft tissues: Changes related to recent surgery present within the posterior paraspinous soft tissues at the level of L1-2. Associated soft tissue edema seen throughout the adjacent lower posterior paraspinous musculature. Small fluid collections are seen at the posterior decompression site of L2 (series 8, image 9). Slight extension along the lower midline incision (series 8, image 3). Overall, the largest collection at the right posterolateral aspect of the thecal sac measures approximately 1.6 x 1.4 x 2.1 cm (series 8, image 9). An additional smaller collection seen at the contralateral left posterolateral aspect of the thecal sac and measures 1.9 x 1.6 x 2.2 cm. Persistent foci of postoperative pneumocephalus. Findings favored to reflect postoperative seromas. Superimposed infection difficult to exclude, and could be considered in the correct clinical setting. Remainder of the paraspinous soft tissues otherwise within normal limits. Few small benign-appearing nonenhancing cystic lesions noted within the kidneys bilaterally. Visualized visceral structures otherwise  unremarkable. Disc levels: T11-12: Degenerative intervertebral disc space narrowing with disc desiccation and mild disc bulge. Mild facet hypertrophy. No significant stenosis. T12-L1: Degenerative intervertebral disc space narrowing with diffuse disc bulge and disc desiccation. Mild facet hypertrophy. No significant spinal stenosis. Foramina remain patent. L1-2: Sequelae of recent posterior and interbody fusion with wide posterior  decompression. Probable bilateral partial facetectomies and foraminotomies. Small residual postoperative collections as above. No more than mild residual spinal stenosis. Foramina appear patent bilaterally. L2-3: Prior posterior and interbody fusion. No residual spinal stenosis. Foramina appear patent. L3-4: Prior posterior and interbody fusion. Mild facet hypertrophy. No significant residual spinal stenosis. Foramina remain patent. L4-5: Prior posterior and interbody fusion. Mild residual facet hypertrophy. No significant residual spinal stenosis. L5-S1: Prior posterior and interbody fusion. Residual left subarticular osseous ridging indents the left ventral thecal sac and closely approximates the descending left S1 nerve root (series 8, image 34). The descending left S1 nerve root is slightly displaced posteriorly. Mild left lateral recess stenosis. Central canal remains patent. Foramina appear grossly patent as well. IMPRESSION: 1. Changes related to recent posterior and interbody fusion with posterior decompression at L1-2. Small residual postoperative collections at the posterior decompression site as above, felt to be most consistent with small postoperative seromas. Superimposed infection difficult to exclude, and could be considered in the correct clinical setting. 2. Additional extensive postoperative changes from prior PLIF throughout the remainder of the lumbar spine. Residual left subarticular osseous ridging at L5-S1 closely approximates and could potentially affect the  descending left S1 nerve root. No other significant residual stenosis or evidence for neural impingement. Electronically Signed   By: Rise Mu M.D.   On: 03/04/2021 00:49   DG Chest Portable 1 View  Result Date: 03/03/2021 CLINICAL DATA:  Fever. EXAM: PORTABLE CHEST 1 VIEW COMPARISON:  02/25/2021 FINDINGS: Cervical and thoracic spine fixation. Midline trachea. Normal heart size for level of inspiration. Apical lordotic positioning. No pleural effusion or pneumothorax. No congestive failure. Clear lungs. IMPRESSION: No acute cardiopulmonary disease. Electronically Signed   By: Jeronimo Greaves M.D.   On: 03/03/2021 19:01   DG C-Arm 1-60 Min  Result Date: 02/25/2021 CLINICAL DATA:  Lumbar fusion extension. EXAM: LUMBAR SPINE - 2-3 VIEW; DG C-ARM 1-60 MIN FLUOROSCOPY TIME:  56 seconds. C-arm fluoroscopic images were obtained intraoperatively and submitted for post operative interpretation. COMPARISON:  MRI lumbar spine dated January 30, 2021. FINDINGS: AP and lateral intraoperative fluoroscopic images demonstrate PLIF extension to L1-L2. Additional hardware included in the field of view from L2-L4. No acute osseous abnormality. IMPRESSION: 1. Intraoperative fluoroscopic guidance for PLIF extension to L1-L2. Electronically Signed   By: Obie Dredge M.D.   On: 02/25/2021 14:57   MM 3D SCREEN BREAST BILATERAL  Result Date: 02/19/2021 CLINICAL DATA:  Screening. EXAM: DIGITAL SCREENING BILATERAL MAMMOGRAM WITH TOMOSYNTHESIS AND CAD TECHNIQUE: Bilateral screening digital craniocaudal and mediolateral oblique mammograms were obtained. Bilateral screening digital breast tomosynthesis was performed. The images were evaluated with computer-aided detection. COMPARISON:  Previous exam(s). ACR Breast Density Category a: The breast tissue is almost entirely fatty. FINDINGS: There are no findings suspicious for malignancy. The images were evaluated with computer-aided detection. IMPRESSION: No mammographic  evidence of malignancy. A result letter of this screening mammogram will be mailed directly to the patient. RECOMMENDATION: Screening mammogram in one year. (Code:SM-B-01Y) BI-RADS CATEGORY  1: Negative. Electronically Signed   By: Sande Brothers M.D.   On: 02/19/2021 13:09   VAS [place] LOWER EXTREMITY VENOUS (DVT)  Result Date: 03/05/2021  Lower Venous DVT Study Indications: FUO.  Risk Factors: None identified. Comparison Study: No prior studies. Performing Technologist: Chanda Busing RVT  Examination Guidelines: A complete evaluation includes B-mode imaging, spectral Doppler, color Doppler, and power Doppler as needed of all accessible portions of each vessel. Bilateral testing is considered an integral part of a complete  examination. Limited examinations for reoccurring indications may be performed as noted. The reflux portion of the exam is performed with the patient in reverse Trendelenburg.  +---------+---------------+---------+-----------+----------+--------------+ RIGHT    CompressibilityPhasicitySpontaneityPropertiesThrombus Aging +---------+---------------+---------+-----------+----------+--------------+ CFV      Full           Yes      Yes                                 +---------+---------------+---------+-----------+----------+--------------+ SFJ      Full                                                        +---------+---------------+---------+-----------+----------+--------------+ FV Prox  Full                                                        +---------+---------------+---------+-----------+----------+--------------+ FV Mid   Full                                                        +---------+---------------+---------+-----------+----------+--------------+ FV DistalFull                                                        +---------+---------------+---------+-----------+----------+--------------+ PFV      Full                                                         +---------+---------------+---------+-----------+----------+--------------+ POP      Full           Yes      Yes                                 +---------+---------------+---------+-----------+----------+--------------+ PTV      Full                                                        +---------+---------------+---------+-----------+----------+--------------+ PERO     Full                                                        +---------+---------------+---------+-----------+----------+--------------+   +---------+---------------+---------+-----------+----------+--------------+ LEFT     CompressibilityPhasicitySpontaneityPropertiesThrombus Aging +---------+---------------+---------+-----------+----------+--------------+ CFV      Full  Yes      Yes                                 +---------+---------------+---------+-----------+----------+--------------+ SFJ      Full                                                        +---------+---------------+---------+-----------+----------+--------------+ FV Prox  Full                                                        +---------+---------------+---------+-----------+----------+--------------+ FV Mid   Full                                                        +---------+---------------+---------+-----------+----------+--------------+ FV DistalFull                                                        +---------+---------------+---------+-----------+----------+--------------+ PFV      Full                                                        +---------+---------------+---------+-----------+----------+--------------+ POP      Full           Yes      Yes                                 +---------+---------------+---------+-----------+----------+--------------+ PTV      Full                                                         +---------+---------------+---------+-----------+----------+--------------+ PERO     Full                                                        +---------+---------------+---------+-----------+----------+--------------+     Summary: RIGHT: - There is no evidence of deep vein thrombosis in the lower extremity.  - No cystic structure found in the popliteal fossa.  LEFT: - There is no evidence of deep vein thrombosis in the lower extremity.  - No cystic structure found in the popliteal fossa.  *See table(s) above for measurements and observations. Electronically signed by Gretta Began MD on 03/05/2021 at 4:59:52  PM.    Final     Antibiotics:  Anti-infectives (From admission, onward)   Start     Dose/Rate Route Frequency Ordered Stop   03/05/21 0900  vancomycin (VANCOREADY) IVPB 1250 mg/250 mL  Status:  Discontinued        1,250 mg 166.7 mL/hr over 90 Minutes Intravenous Every 36 hours 03/03/21 1917 03/04/21 1515   03/04/21 1000  ceFEPIme (MAXIPIME) 2 g in sodium chloride 0.9 % 100 mL IVPB  Status:  Discontinued        2 g 200 mL/hr over 30 Minutes Intravenous Every 12 hours 03/03/21 1917 03/04/21 1515   03/03/21 1830  ceFEPIme (MAXIPIME) 2 g in sodium chloride 0.9 % 100 mL IVPB        2 g 200 mL/hr over 30 Minutes Intravenous  Once 03/03/21 1816 03/03/21 2101   03/03/21 1830  vancomycin (VANCOREADY) IVPB 1000 mg/200 mL  Status:  Discontinued        1,000 mg 200 mL/hr over 60 Minutes Intravenous  Once 03/03/21 1816 03/03/21 1819   03/03/21 1830  vancomycin (VANCOREADY) IVPB 1750 mg/350 mL        1,750 mg 175 mL/hr over 120 Minutes Intravenous  Once 03/03/21 1819 03/04/21 0015      Discharge Exam: Blood pressure 111/75, pulse 85, temperature 98.7 F (37.1 C), temperature source Oral, resp. rate 15, height  (1.651 m), weight 84.8 kg, SpO2 96 %. Neurologic: Grossly normal Incision CDI  Discharge Medications:   Allergies as of 03/06/2021      Reactions   Celecoxib    Other  reaction(s): itching   Citalopram Hydrobromide    Other reaction(s): nausea   Codeine Nausea Only   Severe headache   Latex    Other reaction(s): itching----GLOVES   Meperidine Hcl    Other reaction(s): itching   Morphine And Related Nausea And Vomiting   And headaches   Tape Itching   Trazodone Hcl    Other reaction(s): nightmares      Medication List    TAKE these medications   acetaminophen 650 MG CR tablet Commonly known as: TYLENOL Take 650 mg by mouth every 8 (eight) hours as needed for pain.   ALPRAZolam 0.5 MG tablet Commonly known as: XANAX Take 0.25 mg by mouth at bedtime as needed for sleep.   aspirin 81 MG chewable tablet Chew 81 mg by mouth at bedtime.   BIOTIN PO Take 1 capsule by mouth daily.   cyclobenzaprine 10 MG tablet Commonly known as: FLEXERIL Take 1 tablet (10 mg total) by mouth 3 (three) times daily as needed for muscle spasms.   DULoxetine 60 MG capsule Commonly known as: CYMBALTA Take 60 mg by mouth at bedtime.   levothyroxine 75 MCG tablet Commonly known as: SYNTHROID Take 75 mcg by mouth daily before breakfast.   lisinopril 20 MG tablet Commonly known as: ZESTRIL Take 20 mg by mouth at bedtime.   multivitamin with minerals Tabs tablet Take 1 tablet by mouth daily.   oxyCODONE-acetaminophen 5-325 MG tablet Commonly known as: Percocet Take 1 tablet by mouth every 4 (four) hours as needed for severe pain.   PROBIOTIC DAILY PO Take 1 capsule by mouth daily.   traMADol 50 MG tablet Commonly known as: ULTRAM Take 50 mg by mouth 2 (two) times daily as needed for pain.       Disposition: home   Final Dx: viral syndrome  Discharge Instructions    Call MD for:  persistant nausea and  vomiting   Complete by: As directed    Call MD for:  severe uncontrolled pain   Complete by: As directed    Call MD for:  temperature >100.4   Complete by: As directed    Diet - low sodium heart healthy   Complete by: As directed    Increase  activity slowly   Complete by: As directed    No wound care   Complete by: As directed          Signed: Tia Alertavid S Isatou Agredano 03/06/2021, 10:58 AM

## 2021-03-08 DIAGNOSIS — Z4789 Encounter for other orthopedic aftercare: Secondary | ICD-10-CM | POA: Diagnosis not present

## 2021-03-08 DIAGNOSIS — M48061 Spinal stenosis, lumbar region without neurogenic claudication: Secondary | ICD-10-CM | POA: Diagnosis not present

## 2021-03-08 DIAGNOSIS — I10 Essential (primary) hypertension: Secondary | ICD-10-CM | POA: Diagnosis not present

## 2021-03-08 DIAGNOSIS — R2681 Unsteadiness on feet: Secondary | ICD-10-CM | POA: Diagnosis not present

## 2021-03-08 DIAGNOSIS — N189 Chronic kidney disease, unspecified: Secondary | ICD-10-CM | POA: Diagnosis not present

## 2021-03-08 LAB — CULTURE, BLOOD (ROUTINE X 2)
Culture: NO GROWTH
Culture: NO GROWTH

## 2021-03-14 DIAGNOSIS — R2681 Unsteadiness on feet: Secondary | ICD-10-CM | POA: Diagnosis not present

## 2021-03-14 DIAGNOSIS — Z4789 Encounter for other orthopedic aftercare: Secondary | ICD-10-CM | POA: Diagnosis not present

## 2021-03-14 DIAGNOSIS — N189 Chronic kidney disease, unspecified: Secondary | ICD-10-CM | POA: Diagnosis not present

## 2021-03-14 DIAGNOSIS — M48061 Spinal stenosis, lumbar region without neurogenic claudication: Secondary | ICD-10-CM | POA: Diagnosis not present

## 2021-03-14 DIAGNOSIS — I10 Essential (primary) hypertension: Secondary | ICD-10-CM | POA: Diagnosis not present

## 2021-03-14 DIAGNOSIS — A084 Viral intestinal infection, unspecified: Secondary | ICD-10-CM | POA: Diagnosis not present

## 2021-03-20 DIAGNOSIS — N189 Chronic kidney disease, unspecified: Secondary | ICD-10-CM | POA: Diagnosis not present

## 2021-03-20 DIAGNOSIS — R2681 Unsteadiness on feet: Secondary | ICD-10-CM | POA: Diagnosis not present

## 2021-03-20 DIAGNOSIS — M48061 Spinal stenosis, lumbar region without neurogenic claudication: Secondary | ICD-10-CM | POA: Diagnosis not present

## 2021-03-20 DIAGNOSIS — Z4789 Encounter for other orthopedic aftercare: Secondary | ICD-10-CM | POA: Diagnosis not present

## 2021-03-20 DIAGNOSIS — I10 Essential (primary) hypertension: Secondary | ICD-10-CM | POA: Diagnosis not present

## 2021-04-08 DIAGNOSIS — H353132 Nonexudative age-related macular degeneration, bilateral, intermediate dry stage: Secondary | ICD-10-CM | POA: Diagnosis not present

## 2021-04-11 DIAGNOSIS — M48062 Spinal stenosis, lumbar region with neurogenic claudication: Secondary | ICD-10-CM | POA: Diagnosis not present

## 2021-04-12 DIAGNOSIS — N1831 Chronic kidney disease, stage 3a: Secondary | ICD-10-CM | POA: Diagnosis not present

## 2021-04-12 DIAGNOSIS — E039 Hypothyroidism, unspecified: Secondary | ICD-10-CM | POA: Diagnosis not present

## 2021-04-12 DIAGNOSIS — G8929 Other chronic pain: Secondary | ICD-10-CM | POA: Diagnosis not present

## 2021-04-12 DIAGNOSIS — I10 Essential (primary) hypertension: Secondary | ICD-10-CM | POA: Diagnosis not present

## 2021-04-12 DIAGNOSIS — M47816 Spondylosis without myelopathy or radiculopathy, lumbar region: Secondary | ICD-10-CM | POA: Diagnosis not present

## 2021-05-06 DIAGNOSIS — I10 Essential (primary) hypertension: Secondary | ICD-10-CM | POA: Diagnosis not present

## 2021-05-06 DIAGNOSIS — N1831 Chronic kidney disease, stage 3a: Secondary | ICD-10-CM | POA: Diagnosis not present

## 2021-05-06 DIAGNOSIS — G8929 Other chronic pain: Secondary | ICD-10-CM | POA: Diagnosis not present

## 2021-05-06 DIAGNOSIS — E039 Hypothyroidism, unspecified: Secondary | ICD-10-CM | POA: Diagnosis not present

## 2021-05-25 ENCOUNTER — Emergency Department (HOSPITAL_COMMUNITY): Payer: Medicare Other

## 2021-05-25 ENCOUNTER — Emergency Department (HOSPITAL_COMMUNITY)
Admission: EM | Admit: 2021-05-25 | Discharge: 2021-05-25 | Disposition: A | Discharge status: Home / Self Care | Payer: Medicare Other | Attending: Emergency Medicine | Admitting: Emergency Medicine

## 2021-05-25 ENCOUNTER — Other Ambulatory Visit: Payer: Self-pay

## 2021-05-25 ENCOUNTER — Encounter (HOSPITAL_COMMUNITY): Payer: Self-pay | Admitting: *Deleted

## 2021-05-25 DIAGNOSIS — Z79899 Other long term (current) drug therapy: Secondary | ICD-10-CM | POA: Diagnosis not present

## 2021-05-25 DIAGNOSIS — Z7982 Long term (current) use of aspirin: Secondary | ICD-10-CM | POA: Insufficient documentation

## 2021-05-25 DIAGNOSIS — E039 Hypothyroidism, unspecified: Secondary | ICD-10-CM | POA: Insufficient documentation

## 2021-05-25 DIAGNOSIS — I129 Hypertensive chronic kidney disease with stage 1 through stage 4 chronic kidney disease, or unspecified chronic kidney disease: Secondary | ICD-10-CM | POA: Diagnosis not present

## 2021-05-25 DIAGNOSIS — Z91040 Latex allergy status: Secondary | ICD-10-CM | POA: Insufficient documentation

## 2021-05-25 DIAGNOSIS — S0993XA Unspecified injury of face, initial encounter: Secondary | ICD-10-CM | POA: Diagnosis present

## 2021-05-25 DIAGNOSIS — W19XXXA Unspecified fall, initial encounter: Secondary | ICD-10-CM

## 2021-05-25 DIAGNOSIS — S0512XA Contusion of eyeball and orbital tissues, left eye, initial encounter: Secondary | ICD-10-CM | POA: Diagnosis not present

## 2021-05-25 DIAGNOSIS — M19012 Primary osteoarthritis, left shoulder: Secondary | ICD-10-CM | POA: Diagnosis not present

## 2021-05-25 DIAGNOSIS — S0181XA Laceration without foreign body of other part of head, initial encounter: Secondary | ICD-10-CM | POA: Diagnosis not present

## 2021-05-25 DIAGNOSIS — W010XXA Fall on same level from slipping, tripping and stumbling without subsequent striking against object, initial encounter: Secondary | ICD-10-CM | POA: Diagnosis not present

## 2021-05-25 DIAGNOSIS — N189 Chronic kidney disease, unspecified: Secondary | ICD-10-CM | POA: Insufficient documentation

## 2021-05-25 DIAGNOSIS — S80212A Abrasion, left knee, initial encounter: Secondary | ICD-10-CM | POA: Diagnosis not present

## 2021-05-25 DIAGNOSIS — Z9889 Other specified postprocedural states: Secondary | ICD-10-CM | POA: Diagnosis not present

## 2021-05-25 DIAGNOSIS — S0003XA Contusion of scalp, initial encounter: Secondary | ICD-10-CM | POA: Diagnosis not present

## 2021-05-25 MED ORDER — TRAMADOL HCL 50 MG PO TABS
50.0000 mg | ORAL_TABLET | Freq: Once | ORAL | Status: AC
Start: 2021-05-25 — End: 2021-05-25
  Administered 2021-05-25: 50 mg via ORAL
  Filled 2021-05-25: qty 1

## 2021-05-25 MED ORDER — HYDROCODONE-ACETAMINOPHEN 5-325 MG PO TABS
1.0000 | ORAL_TABLET | Freq: Once | ORAL | Status: DC
Start: 2021-05-25 — End: 2021-05-26
  Filled 2021-05-25: qty 1

## 2021-05-25 MED ORDER — LIDOCAINE HCL 2 % IJ SOLN
10.0000 mL | Freq: Once | INTRAMUSCULAR | Status: AC
  Administered 2021-05-25: 200 mg via INTRADERMAL
  Filled 2021-05-25: qty 20

## 2021-05-25 MED ORDER — ONDANSETRON 4 MG PO TBDP
4.0000 mg | ORAL_TABLET | Freq: Once | ORAL | Status: AC
  Administered 2021-05-25: 4 mg via ORAL
  Filled 2021-05-25: qty 1

## 2021-05-25 NOTE — ED Provider Notes (Addendum)
[facility]-EMERGENCY DEPT Provider Note   CSN: [id number] Arrival date & time: 05/25/21  1608     History Chief Complaint  Patient presents with   Laceration    [name] is a 75 y.o. female.  HPI  75 year old female with a history of anxiety, arthritis, CKD, depression, GERD, hypertension, hypothyroidism, macular degeneration, who presents to the emergency department today for evaluation of a fall.  Patient had a mechanical fall prior to arrival and cut her head on some glass.  She did not lose consciousness.  She denies any neck or back pain.  She has an abrasion to the left knee but denies any severe pain.  Denies any vision changes, numbness, weakness, vomiting or other complaints.  Past Medical History:  Diagnosis Date   Anxiety    Arthritis    Chronic kidney disease 2019   ARF while in hospital for PLIF   Depression    GERD (gastroesophageal reflux disease)    Hypertension    Hypothyroidism    Macular degeneration of both eyes    not age related   PONV (postoperative nausea and vomiting)    also is slow to wake up    Patient Active Problem List   Diagnosis Date Noted   Fever 03/04/2021   Fever and chills 03/04/2021   S/P lumbar fusion 02/25/2021    Past Surgical History:  Procedure Laterality Date   ABDOMINAL HYSTERECTOMY     total   ANTERIOR CERVICAL DISCECTOMY     APPENDECTOMY     BACK SURGERY     PLIF   BUNIONECTOMY Right    CHOLECYSTECTOMY     COLONOSCOPY     HAND SURGERY Left    thumb joint, carpal tunnel and cyst   HAND SURGERY Right    thumb and carpal tunnel   POSTERIOR CERVICAL FUSION/FORAMINOTOMY       OB History   No obstetric history on file.     Family History  Problem Relation Age of Onset   Breast cancer Neg Hx     Social History   Tobacco Use   Smoking status: Never   Smokeless tobacco: Never  Vaping Use   Vaping Use: Never used  Substance Use Topics   Alcohol use: Never   Drug use: Never     Home Medications Prior to Admission medications   Medication Sig Start Date End Date Taking? Authorizing Provider  acetaminophen (TYLENOL) 650 MG CR tablet Take 650 mg by mouth every 8 (eight) hours as needed for pain.    [provider]  ALPRAZolam Prudy Feeler) 0.5 MG tablet Take 0.25 mg by mouth at bedtime as needed for sleep. 01/04/21   [provider]  aspirin 81 MG chewable tablet Chew 81 mg by mouth at bedtime.    [provider]  BIOTIN PO Take 1 capsule by mouth daily.    [provider]  cyclobenzaprine (FLEXERIL) 10 MG tablet Take 1 tablet (10 mg total) by mouth 3 (three) times daily as needed for muscle spasms. 02/26/21   Meyran, Tiana Loft, NP  DULoxetine (CYMBALTA) 60 MG capsule Take 60 mg by mouth at bedtime. 01/04/21   [provider]  levothyroxine (SYNTHROID) 75 MCG tablet Take 75 mcg by mouth daily before breakfast. 01/04/21   [provider]  lisinopril (ZESTRIL) 20 MG tablet Take 20 mg by mouth at bedtime. 01/04/21   [provider]  Multiple Vitamin (MULTIVITAMIN WITH MINERALS) TABS tablet Take 1 tablet by mouth daily.  [provider]  oxyCODONE-acetaminophen (PERCOCET) 5-325 MG tablet Take 1 tablet by mouth every 4 (four) hours as needed for severe pain. 02/26/21 02/26/22  Meyran, Tiana Loft, NP  Probiotic Product (PROBIOTIC DAILY PO) Take 1 capsule by mouth daily.    [provider]  traMADol (ULTRAM) 50 MG tablet Take 50 mg by mouth 2 (two) times daily as needed for pain. 01/04/21   [provider]    Allergies    Celecoxib, Citalopram hydrobromide, Codeine, Latex, Meperidine hcl, Morphine and related, Tape, and Trazodone hcl  Review of Systems   Review of Systems  Constitutional:  Negative for fever.  Eyes:  Negative for visual disturbance.  Respiratory:  Negative for shortness of breath.   Cardiovascular:  Negative for chest pain.  Gastrointestinal:  Negative for  abdominal pain.  Genitourinary:  Negative for pelvic pain.  Musculoskeletal:  Negative for back pain and neck pain.  Skin:  Positive for wound.  Neurological:        Head injury, no loc   Physical Exam Updated Vital Signs BP (!) 160/98   Pulse (!) 109   Temp 98.7 F (37.1 C) (Oral)   Resp 18   SpO2 95%   Physical Exam Vitals and nursing note reviewed.  Constitutional:      General: She is not in acute distress.    Appearance: She is well-developed.  HENT:     Head: Normocephalic.     Comments: 2cm laceration above the left eyebrow Eyes:     Conjunctiva/sclera: Conjunctivae normal.  Cardiovascular:     Rate and Rhythm: Normal rate.  Pulmonary:     Effort: Pulmonary effort is normal.  Musculoskeletal:        General: Normal range of motion.     Cervical back: Neck supple.     Comments: No midline ttp to the cervical, thoracic or lumbar spine. No ttp to the left knee.  Skin:    General: Skin is warm and dry.  Neurological:     Mental Status: She is alert.     Comments: 5/5 strength to the bue/ble with normal sensation throughout    ED Results / Procedures / Treatments   Labs (all labs ordered are listed, but only abnormal results are displayed) Labs Reviewed - No data to display  EKG None  Radiology CT Head Wo Contrast  Result Date: 05/25/2021 CLINICAL DATA:  Fall, facial trauma, gaseous above left eye and left eye bruising, headache EXAM: CT HEAD WITHOUT CONTRAST CT MAXILLOFACIAL WITHOUT CONTRAST TECHNIQUE: Multidetector CT imaging of the head and maxillofacial structures were performed using the standard protocol without intravenous contrast. Multiplanar CT image reconstructions of the maxillofacial structures were also generated. COMPARISON:  None. FINDINGS: CT HEAD FINDINGS Brain: No evidence of acute infarction, hemorrhage, hydrocephalus, extra-axial collection or mass lesion/mass effect. Vascular: No hyperdense vessel or unexpected calcification. CT FACIAL BONES  FINDINGS Skull: Normal. Negative for fracture or focal lesion. Facial bones: No displaced fractures or dislocations. Sinuses/Orbits: No acute finding. Other: Soft tissue hematoma about the left frontal scalp (series 3, image 21). IMPRESSION: 1. No acute intracranial pathology. 2. No displaced fracture or dislocation of the facial bones. 3. Soft tissue hematoma about the left frontal scalp. Electronically Signed   By: Lauralyn Primes M.D.   On: 05/25/2021 19:08   CT Maxillofacial Wo Contrast  Result Date: 05/25/2021 CLINICAL DATA:  Fall, facial trauma, gaseous above left eye and left eye bruising, headache EXAM: CT HEAD WITHOUT CONTRAST CT MAXILLOFACIAL WITHOUT CONTRAST  TECHNIQUE: Multidetector CT imaging of the head and maxillofacial structures were performed using the standard protocol without intravenous contrast. Multiplanar CT image reconstructions of the maxillofacial structures were also generated. COMPARISON:  None. FINDINGS: CT HEAD FINDINGS Brain: No evidence of acute infarction, hemorrhage, hydrocephalus, extra-axial collection or mass lesion/mass effect. Vascular: No hyperdense vessel or unexpected calcification. CT FACIAL BONES FINDINGS Skull: Normal. Negative for fracture or focal lesion. Facial bones: No displaced fractures or dislocations. Sinuses/Orbits: No acute finding. Other: Soft tissue hematoma about the left frontal scalp (series 3, image 21). IMPRESSION: 1. No acute intracranial pathology. 2. No displaced fracture or dislocation of the facial bones. 3. Soft tissue hematoma about the left frontal scalp. Electronically Signed   By: Lauralyn PrimesAlex  Bibbey M.D.   On: 05/25/2021 19:08    Procedures .Marland Kitchen.Laceration Repair  Date/Time: 05/25/2021 7:51 PM Performed by: Karrie Meresouture, Gertie Broerman S, PA-C Authorized by: Karrie Meresouture, Kairi Harshbarger S, PA-C   Consent:    Consent obtained:  Verbal   Consent given by:  Patient   Risks, benefits, and alternatives were discussed: yes     Risks discussed:  Infection, pain and poor  cosmetic result Universal protocol:    Procedure explained and questions answered to patient or proxy's satisfaction: yes     Immediately prior to procedure, a time out was called: yes     Patient identity confirmed:  Verbally with patient Anesthesia:    Anesthesia method:  Local infiltration   Local anesthetic:  Lidocaine 2% WITH epi Laceration details:    Location:  Face   Face location:  Forehead   Length (cm):  2 Pre-procedure details:    Preparation:  Patient was prepped and draped in usual sterile fashion and imaging obtained to evaluate for foreign bodies Exploration:    Limited defect created (wound extended): no     Hemostasis achieved with:  Epinephrine and direct pressure   Contaminated: no   Treatment:    Area cleansed with:  Povidone-iodine and saline   Amount of cleaning:  Standard   Irrigation solution:  Sterile saline   Irrigation method:  Pressure wash   Debridement:  None   Undermining:  None   Layers/structures repaired:  Deep subcutaneous Skin repair:    Repair method:  Sutures   Suture size:  5-0   Wound skin closure material used: vicryl rapide.   Number of sutures:  5   Medications Ordered in ED Medications  lidocaine (XYLOCAINE) 2 % (with pres) injection 200 mg (has no administration in time range)  HYDROcodone-acetaminophen (NORCO/VICODIN) 5-325 MG per tablet 1 tablet (has no administration in time range)  ondansetron (ZOFRAN-ODT) disintegrating tablet 4 mg (has no administration in time range)    ED Course  I have reviewed the triage vital signs and the nursing notes.  Pertinent labs & imaging results that were available during my care of the patient were reviewed by me and considered in my medical decision making (see chart for details).    MDM Rules/Calculators/A&P                          75 y/o f presents for eval of mechanical fall and head trauma   CT head/maxillofacial reviewed/interpreted - 1. No acute intracranial pathology. 2. No  displaced fracture or dislocation of the facial bones. 3. Soft tissue hematoma about the left frontal scalp.  In regards to lac, irrigation performed. Wound explored and base of wound visualized in a bloodless field without evidence of foreign  body.  Laceration occurred < 8 hours prior to repair which was well tolerated.  Tdap UTD.  Pt has  no comorbidities to effect normal wound healing. Pt will not need abx. Sutures are absorbable and will not need to be removed.  During lac repair pt now c/o left shoulder pain and she does have ttp on exam. Xray left shoulder ordered.  At shift change, care transitioned to Langston Masker, PA-C pending xray. If neg, pt safe for d/c home.     Final Clinical Impression(s) / ED Diagnoses Final diagnoses:  Fall, initial encounter  Laceration of forehead, initial encounter    Rx / DC Orders ED Discharge Orders     None        Karrie Meres, PA-C 05/25/21 1951    Rayne Du 05/25/21 Derrill Center, MD 05/26/21 [phone]

## 2021-05-25 NOTE — Discharge Instructions (Addendum)
The sutures are absorbably so you will not need to have them removed. You can continue your regular pain regimen at home for your pain and muscle soreness.   Please follow up with the orthopedic doctor within 5-7 days for re-evaluation of your symptoms. If you do not have a primary care provider, information for a healthcare clinic has been provided for you to make arrangements for follow up care. Please return to the emergency department for any new or worsening symptoms.

## 2021-05-25 NOTE — ED Triage Notes (Signed)
Pt has laceration to left forehead since tripping and falling today. No loss of consciousness, no blood thinners.

## 2021-05-25 NOTE — ED Provider Notes (Signed)
Pt's care taken over from [facility] PA-C at 8pm.  Xray left shoulder pending.   Xray returned no fracture.  Pt advised of results    Osie Cheeks 05/25/21 2103    Lorre Nick, MD 05/26/21 [phone]

## 2021-06-28 DIAGNOSIS — M85851 Other specified disorders of bone density and structure, right thigh: Secondary | ICD-10-CM | POA: Diagnosis not present

## 2021-06-28 DIAGNOSIS — N1831 Chronic kidney disease, stage 3a: Secondary | ICD-10-CM | POA: Diagnosis not present

## 2021-06-28 DIAGNOSIS — E039 Hypothyroidism, unspecified: Secondary | ICD-10-CM | POA: Diagnosis not present

## 2021-06-28 DIAGNOSIS — E559 Vitamin D deficiency, unspecified: Secondary | ICD-10-CM | POA: Diagnosis not present

## 2021-06-28 DIAGNOSIS — I10 Essential (primary) hypertension: Secondary | ICD-10-CM | POA: Diagnosis not present

## 2021-06-28 DIAGNOSIS — M5440 Lumbago with sciatica, unspecified side: Secondary | ICD-10-CM | POA: Diagnosis not present

## 2021-07-05 DIAGNOSIS — Z0000 Encounter for general adult medical examination without abnormal findings: Secondary | ICD-10-CM | POA: Diagnosis not present

## 2021-07-11 DIAGNOSIS — M48062 Spinal stenosis, lumbar region with neurogenic claudication: Secondary | ICD-10-CM | POA: Diagnosis not present

## 2021-07-11 DIAGNOSIS — I10 Essential (primary) hypertension: Secondary | ICD-10-CM | POA: Diagnosis not present

## 2021-07-11 DIAGNOSIS — Z981 Arthrodesis status: Secondary | ICD-10-CM | POA: Diagnosis not present

## 2021-07-15 DIAGNOSIS — E039 Hypothyroidism, unspecified: Secondary | ICD-10-CM | POA: Diagnosis not present

## 2021-07-15 DIAGNOSIS — I10 Essential (primary) hypertension: Secondary | ICD-10-CM | POA: Diagnosis not present

## 2021-07-15 DIAGNOSIS — N1831 Chronic kidney disease, stage 3a: Secondary | ICD-10-CM | POA: Diagnosis not present

## 2021-07-15 DIAGNOSIS — M85851 Other specified disorders of bone density and structure, right thigh: Secondary | ICD-10-CM | POA: Diagnosis not present

## 2021-07-15 DIAGNOSIS — K219 Gastro-esophageal reflux disease without esophagitis: Secondary | ICD-10-CM | POA: Diagnosis not present

## 2021-07-15 DIAGNOSIS — M5440 Lumbago with sciatica, unspecified side: Secondary | ICD-10-CM | POA: Diagnosis not present

## 2021-07-15 DIAGNOSIS — G8929 Other chronic pain: Secondary | ICD-10-CM | POA: Diagnosis not present

## 2021-07-15 DIAGNOSIS — R7303 Prediabetes: Secondary | ICD-10-CM | POA: Diagnosis not present

## 2021-07-15 DIAGNOSIS — Z8739 Personal history of other diseases of the musculoskeletal system and connective tissue: Secondary | ICD-10-CM | POA: Diagnosis not present

## 2021-07-16 DIAGNOSIS — N1831 Chronic kidney disease, stage 3a: Secondary | ICD-10-CM | POA: Diagnosis not present

## 2021-07-16 DIAGNOSIS — K219 Gastro-esophageal reflux disease without esophagitis: Secondary | ICD-10-CM | POA: Diagnosis not present

## 2021-07-16 DIAGNOSIS — E039 Hypothyroidism, unspecified: Secondary | ICD-10-CM | POA: Diagnosis not present

## 2021-07-16 DIAGNOSIS — G8929 Other chronic pain: Secondary | ICD-10-CM | POA: Diagnosis not present

## 2021-07-16 DIAGNOSIS — M47816 Spondylosis without myelopathy or radiculopathy, lumbar region: Secondary | ICD-10-CM | POA: Diagnosis not present

## 2021-07-16 DIAGNOSIS — I10 Essential (primary) hypertension: Secondary | ICD-10-CM | POA: Diagnosis not present

## 2021-10-02 DIAGNOSIS — N1831 Chronic kidney disease, stage 3a: Secondary | ICD-10-CM | POA: Diagnosis not present

## 2021-10-02 DIAGNOSIS — I10 Essential (primary) hypertension: Secondary | ICD-10-CM | POA: Diagnosis not present

## 2021-10-02 DIAGNOSIS — G8929 Other chronic pain: Secondary | ICD-10-CM | POA: Diagnosis not present

## 2021-10-02 DIAGNOSIS — K219 Gastro-esophageal reflux disease without esophagitis: Secondary | ICD-10-CM | POA: Diagnosis not present

## 2021-10-02 DIAGNOSIS — E039 Hypothyroidism, unspecified: Secondary | ICD-10-CM | POA: Diagnosis not present

## 2021-10-06 DIAGNOSIS — R0981 Nasal congestion: Secondary | ICD-10-CM | POA: Diagnosis not present

## 2021-10-06 DIAGNOSIS — R509 Fever, unspecified: Secondary | ICD-10-CM | POA: Diagnosis not present

## 2021-10-06 DIAGNOSIS — H1032 Unspecified acute conjunctivitis, left eye: Secondary | ICD-10-CM | POA: Diagnosis not present

## 2021-10-06 DIAGNOSIS — R051 Acute cough: Secondary | ICD-10-CM | POA: Diagnosis not present

## 2021-10-06 DIAGNOSIS — U071 COVID-19: Secondary | ICD-10-CM | POA: Diagnosis not present

## 2021-10-16 DIAGNOSIS — I10 Essential (primary) hypertension: Secondary | ICD-10-CM | POA: Diagnosis not present

## 2021-10-16 DIAGNOSIS — M5440 Lumbago with sciatica, unspecified side: Secondary | ICD-10-CM | POA: Diagnosis not present

## 2021-10-16 DIAGNOSIS — R7303 Prediabetes: Secondary | ICD-10-CM | POA: Diagnosis not present

## 2021-10-16 DIAGNOSIS — E039 Hypothyroidism, unspecified: Secondary | ICD-10-CM | POA: Diagnosis not present

## 2021-10-16 DIAGNOSIS — M85851 Other specified disorders of bone density and structure, right thigh: Secondary | ICD-10-CM | POA: Diagnosis not present

## 2021-10-16 DIAGNOSIS — K219 Gastro-esophageal reflux disease without esophagitis: Secondary | ICD-10-CM | POA: Diagnosis not present

## 2021-11-27 ENCOUNTER — Other Ambulatory Visit: Payer: Self-pay

## 2021-11-27 ENCOUNTER — Ambulatory Visit: Payer: Medicare Other | Admitting: Podiatry

## 2021-11-27 ENCOUNTER — Encounter: Payer: Self-pay | Admitting: Podiatry

## 2021-11-27 DIAGNOSIS — M79675 Pain in left toe(s): Secondary | ICD-10-CM

## 2021-11-27 DIAGNOSIS — M79674 Pain in right toe(s): Secondary | ICD-10-CM | POA: Diagnosis not present

## 2021-11-27 DIAGNOSIS — B351 Tinea unguium: Secondary | ICD-10-CM | POA: Diagnosis not present

## 2021-12-03 NOTE — Progress Notes (Signed)
°  Subjective:  Patient ID: [name], female    DOB: December 15, 1945,  MRN: [id number]  Chief Complaint  Patient presents with   Nail Problem    Nail trim    76 y.o. female returns for the above complaint.  Patient presents with thickened elongated dystrophic toenails x10.  Patient would like to have them debrided down.  She is not able to do it herself.  She denies any other acute complaints.  She is not a diabetic.  Objective:  There were no vitals filed for this visit. Podiatric Exam: Vascular: dorsalis pedis and posterior tibial pulses are palpable bilateral. Capillary return is immediate. Temperature gradient is WNL. Skin turgor WNL  Sensorium: Normal Semmes Weinstein monofilament test. Normal tactile sensation bilaterally. Nail Exam: Pt has thick disfigured discolored nails with subungual debris noted bilateral entire nail hallux through fifth toenails.  Pain on palpation to the nails. Ulcer Exam: There is no evidence of ulcer or pre-ulcerative changes or infection. Orthopedic Exam: Muscle tone and strength are WNL. No limitations in general ROM. No crepitus or effusions noted.  Skin: No Porokeratosis. No infection or ulcers    Assessment & Plan:  No diagnosis found.  Patient was evaluated and treated and all questions answered.  Onychomycosis with pain  -Nails palliatively debrided as below. -Educated on self-care  Procedure: Nail Debridement Rationale: pain  Type of Debridement: manual, sharp debridement. Instrumentation: Nail nipper, rotary burr. Number of Nails: 10  Procedures and Treatment: Consent by patient was obtained for treatment procedures. The patient understood the discussion of treatment and procedures well. All questions were answered thoroughly reviewed. Debridement of mycotic and hypertrophic toenails, 1 through 5 bilateral and clearing of subungual debris. No ulceration, no infection noted.  Return Visit-Office Procedure: Patient instructed to return to the  office for a follow up visit 3 months for continued evaluation and treatment.  Boneta Lucks, DPM    Return in about 3 months (around 02/25/2022) for [facility] .

## 2021-12-24 ENCOUNTER — Ambulatory Visit (INDEPENDENT_AMBULATORY_CARE_PROVIDER_SITE_OTHER): Payer: Medicare Other | Admitting: Adult Health

## 2021-12-24 ENCOUNTER — Encounter: Payer: Self-pay | Admitting: Adult Health

## 2021-12-24 VITALS — BP 110/78 | HR 104 | Temp 98.4°F | Ht 59.5 in | Wt 177.0 lb

## 2021-12-24 DIAGNOSIS — F5101 Primary insomnia: Secondary | ICD-10-CM

## 2021-12-24 DIAGNOSIS — E039 Hypothyroidism, unspecified: Secondary | ICD-10-CM | POA: Insufficient documentation

## 2021-12-24 DIAGNOSIS — I10 Essential (primary) hypertension: Secondary | ICD-10-CM | POA: Diagnosis not present

## 2021-12-24 DIAGNOSIS — M549 Dorsalgia, unspecified: Secondary | ICD-10-CM | POA: Diagnosis not present

## 2021-12-24 DIAGNOSIS — F32A Depression, unspecified: Secondary | ICD-10-CM

## 2021-12-24 DIAGNOSIS — Z7689 Persons encountering health services in other specified circumstances: Secondary | ICD-10-CM | POA: Diagnosis not present

## 2021-12-24 DIAGNOSIS — G8929 Other chronic pain: Secondary | ICD-10-CM | POA: Diagnosis not present

## 2021-12-24 NOTE — Patient Instructions (Signed)
It was great seeing you today   Please follow up for your physical exam 2023

## 2021-12-24 NOTE — Progress Notes (Signed)
Patient presents to clinic today to establish care. She is a pleasant 76 year old female who  has a past medical history of Anxiety, Arthritis, Chronic kidney disease (2019), Depression, GERD (gastroesophageal reflux disease), Hypertension, Hypothyroidism, Macular degeneration of both eyes, and PONV (postoperative nausea and vomiting).  Coming from [facility] - Last CPE in June 2022.   Acute Concerns: Establish Care  Chronic Issues: Hypertension - Takes lisinopril 10 mg daily.  She denies dizziness, lightheadedness, chest pain, shortness of breath BP Readings from Last 3 Encounters:  12/24/21 110/78  05/25/21 (!) 143/96  03/06/21 102/63   Pre Diabetes - takes Ozempic 0.25 mg weekly. Has been on it for about 3 months and has lost about 20 pounds.  Wt Readings from Last 3 Encounters:  12/24/21 177 lb (80.3 kg)  03/03/21 186 lb 15.2 oz (84.8 kg)  02/25/21 187 lb (84.8 kg)   Hypothyroidism - takes Synthroid 75 mcg daily   Insomnia - takes 0.25 mg QHS PRN   Chronic back pain -history of anterior cervical discectomy/posterior cervical fusion, and posterior lumbar interbody fusion.  Takes Tramadol 50 mg daily, Cymbalta 60 mg daily and Flexeril 10 mg PRN.   Depression - Cymbalta 60 mg daily. Feels well controlled.   Macular Degeneration- since age 89. She is seen by [facility].   Health Maintenance: Dental -- Routine  Vision -- Routine  Immunizations -- UTD  Colonoscopy -- Does not want to do  Mammogram -- Completed for yearly   Bone Density --  Needs to have done    Past Medical History:  Diagnosis Date   Anxiety    Arthritis    Chronic kidney disease 2019   ARF while in hospital for PLIF   Depression    GERD (gastroesophageal reflux disease)    Hypertension    Hypothyroidism    Macular degeneration of both eyes    not age related   PONV (postoperative nausea and vomiting)    also is slow to wake up    Past Surgical History:  Procedure Laterality  Date   ABDOMINAL HYSTERECTOMY     total   ANTERIOR CERVICAL DISCECTOMY     APPENDECTOMY     BACK SURGERY     PLIF   BUNIONECTOMY Right    CHOLECYSTECTOMY     COLONOSCOPY     HAND SURGERY Left    thumb joint, carpal tunnel and cyst   HAND SURGERY Right    thumb and carpal tunnel   POSTERIOR CERVICAL FUSION/FORAMINOTOMY      Current Outpatient Medications on File Prior to Visit  Medication Sig Dispense Refill   acetaminophen (TYLENOL) 650 MG CR tablet Take 650 mg by mouth every 8 (eight) hours as needed for pain.     ALPRAZolam (XANAX) 0.5 MG tablet Take 0.25 mg by mouth at bedtime as needed for sleep.     aspirin 81 MG chewable tablet Chew 81 mg by mouth at bedtime.     Bacillus Coagulans-Inulin (PROBIOTIC) 1-250 BILLION-MG CAPS 1 capsule     cyclobenzaprine (FLEXERIL) 10 MG tablet Take 1 tablet (10 mg total) by mouth 3 (three) times daily as needed for muscle spasms. 30 tablet 0   DULoxetine (CYMBALTA) 60 MG capsule Take 60 mg by mouth at bedtime.     levothyroxine (SYNTHROID) 75 MCG tablet Take 75 mcg by mouth daily before breakfast.     lisinopril (ZESTRIL) 20 MG tablet Take 20 mg by mouth at bedtime.  Multiple Vitamin (MULTIVITAMIN WITH MINERALS) TABS tablet Take 1 tablet by mouth daily.     OZEMPIC, 0.25 OR 0.5 MG/DOSE, 2 MG/1.5ML SOPN Inject into the skin.     Probiotic Product (PROBIOTIC DAILY PO) Take 1 capsule by mouth daily.     traMADol (ULTRAM) 50 MG tablet Take 50 mg by mouth 2 (two) times daily as needed for pain.     No current facility-administered medications on file prior to visit.    Allergies  Allergen Reactions   Celecoxib     Other reaction(s): itching   Citalopram Hydrobromide     Other reaction(s): nausea   Codeine Nausea Only and Other (See Comments)    Severe headache   Latex Itching    Other reaction(s): itching----GLOVES   Meperidine Hcl     Other reaction(s): itching   Morphine And Related Nausea And Vomiting    And headaches   Tape  Itching   Trazodone Hcl     Other reaction(s): nightmares    Family History  Problem Relation Age of Onset   Breast cancer Neg Hx     Social History   Socioeconomic History   Marital status: Married    Spouse name: Not on file   Number of children: Not on file   Years of education: Not on file   Highest education level: Not on file  Occupational History   Not on file  Tobacco Use   Smoking status: Never   Smokeless tobacco: Never  Vaping Use   Vaping Use: Never used  Substance and Sexual Activity   Alcohol use: Never   Drug use: Never   Sexual activity: Not on file  Other Topics Concern   Not on file  Social History Narrative   Not on file   Social Determinants of Health   Financial Resource Strain: Not on file  Food Insecurity: Not on file  Transportation Needs: Not on file  Physical Activity: Not on file  Stress: Not on file  Social Connections: Not on file  Intimate Partner Violence: Not on file    Review of Systems  Constitutional: Negative.   HENT: Negative.    Eyes: Negative.   Respiratory: Negative.    Cardiovascular: Negative.   Gastrointestinal: Negative.   Genitourinary: Negative.   Musculoskeletal:  Positive for back pain, joint pain and neck pain.  Skin: Negative.   Neurological: Negative.   Endo/Heme/Allergies: Negative.   Psychiatric/Behavioral: Negative.    All other systems reviewed and are negative.  BP 110/78    Pulse (!) 104    Temp 98.4 F (36.9 C) (Oral)    Ht 4' 11.5" (1.511 m)    Wt 177 lb (80.3 kg)    SpO2 98%    BMI 35.15 kg/m   Physical Exam Vitals and nursing note reviewed.  Constitutional:      General: She is not in acute distress.    Appearance: Normal appearance. She is well-developed. She is not ill-appearing.  HENT:     Head: Normocephalic and atraumatic.     Right Ear: Tympanic membrane, ear canal and external ear normal. There is no impacted cerumen.     Left Ear: Tympanic membrane, ear canal and external ear  normal. There is no impacted cerumen.     Nose: Nose normal. No congestion or rhinorrhea.     Mouth/Throat:     Mouth: Mucous membranes are moist.     Pharynx: Oropharynx is clear. No oropharyngeal exudate or posterior oropharyngeal erythema.  Eyes:  General:        Right eye: No discharge.        Left eye: No discharge.     Extraocular Movements: Extraocular movements intact.     Conjunctiva/sclera: Conjunctivae normal.     Pupils: Pupils are equal, round, and reactive to light.  Neck:     Thyroid: No thyromegaly.     Vascular: No carotid bruit.     Trachea: No tracheal deviation.  Cardiovascular:     Rate and Rhythm: Normal rate and regular rhythm.     Pulses: Normal pulses.     Heart sounds: Normal heart sounds. No murmur heard.   No friction rub. No gallop.  Pulmonary:     Effort: Pulmonary effort is normal. No respiratory distress.     Breath sounds: Normal breath sounds. No stridor. No wheezing, rhonchi or rales.  Chest:     Chest wall: No tenderness.  Abdominal:     General: Abdomen is flat. Bowel sounds are normal. There is no distension.     Palpations: Abdomen is soft. There is no mass.     Tenderness: There is no abdominal tenderness. There is no right CVA tenderness, left CVA tenderness, guarding or rebound.     Hernia: No hernia is present.  Musculoskeletal:        General: No swelling, tenderness, deformity or signs of injury. Normal range of motion.     Cervical back: Normal range of motion and neck supple.     Right lower leg: No edema.     Left lower leg: No edema.  Lymphadenopathy:     Cervical: No cervical adenopathy.  Skin:    General: Skin is warm and dry.     Capillary Refill: Capillary refill takes less than 2 seconds.     Coloration: Skin is not jaundiced or pale.     Findings: No bruising, erythema, lesion or rash.  Neurological:     General: No focal deficit present.     Mental Status: She is alert and oriented to person, place, and time.      Cranial Nerves: No cranial nerve deficit.     Sensory: No sensory deficit.     Motor: No weakness.     Coordination: Coordination normal.     Gait: Gait normal.     Deep Tendon Reflexes: Reflexes normal.  Psychiatric:        Mood and Affect: Mood normal.        Behavior: Behavior normal.        Thought Content: Thought content normal.        Judgment: Judgment normal.   Assessment/Plan: 1. Encounter to establish care - Will request records from [facility] - Follow up in June for CPE   2. Acquired hypothyroidism - No change in Synthroid dose at this time   3. Primary hypertension - Well controlled with lisinopril 10 mg   4. Depression, unspecified depression type - Well controlled with Cymbalta   5. Chronic midline back pain, unspecified back location - Continue with current plan of care  6. Primary insomnia - Continue Xanax PRN    [name], NP

## 2022-01-09 ENCOUNTER — Other Ambulatory Visit: Payer: Self-pay

## 2022-01-09 ENCOUNTER — Encounter (INDEPENDENT_AMBULATORY_CARE_PROVIDER_SITE_OTHER): Payer: Medicare Other | Admitting: Ophthalmology

## 2022-01-09 DIAGNOSIS — I10 Essential (primary) hypertension: Secondary | ICD-10-CM | POA: Diagnosis not present

## 2022-01-09 DIAGNOSIS — H43813 Vitreous degeneration, bilateral: Secondary | ICD-10-CM

## 2022-01-09 DIAGNOSIS — H35033 Hypertensive retinopathy, bilateral: Secondary | ICD-10-CM

## 2022-01-09 DIAGNOSIS — H353112 Nonexudative age-related macular degeneration, right eye, intermediate dry stage: Secondary | ICD-10-CM

## 2022-01-09 DIAGNOSIS — H353124 Nonexudative age-related macular degeneration, left eye, advanced atrophic with subfoveal involvement: Secondary | ICD-10-CM | POA: Diagnosis not present

## 2022-01-09 DIAGNOSIS — H2513 Age-related nuclear cataract, bilateral: Secondary | ICD-10-CM

## 2022-01-30 ENCOUNTER — Other Ambulatory Visit: Payer: Self-pay

## 2022-01-30 NOTE — Telephone Encounter (Signed)
Patient called requesting Rx refills on ?OZEMPIC, 0.25 OR 0.5 MG/DOSE, 2 MG/1.5ML SOPN ?traMADol (ULTRAM) 50 MG tablet ?

## 2022-01-31 MED ORDER — TRAMADOL HCL 50 MG PO TABS: 50.0000 mg | ORAL_TABLET | Freq: Two times a day (BID) | ORAL | 1 refills | Status: DC | PRN

## 2022-01-31 MED ORDER — OZEMPIC (0.25 OR 0.5 MG/DOSE) 2 MG/1.5ML ~~LOC~~ SOPN: 0.2500 mg | PEN_INJECTOR | SUBCUTANEOUS | 3 refills | Status: AC

## 2022-01-31 NOTE — Telephone Encounter (Signed)
Are you taking over these Rx for pt. Also, if so can you advise on Ozempic dosage? ?

## 2022-01-31 NOTE — Addendum Note (Signed)
Addended by: Nancy Fetter on: 01/31/2022 10:37 AM ? ? Modules accepted: Orders ? ?

## 2022-01-31 NOTE — Addendum Note (Signed)
Addended by: Waymon Amato R on: 01/31/2022 08:44 AM ? ? Modules accepted: Orders ? ?

## 2022-02-03 ENCOUNTER — Ambulatory Visit (INDEPENDENT_AMBULATORY_CARE_PROVIDER_SITE_OTHER): Payer: Medicare Other

## 2022-02-03 VITALS — Ht 60.0 in | Wt 174.0 lb

## 2022-02-03 DIAGNOSIS — Z0000 Encounter for general adult medical examination without abnormal findings: Secondary | ICD-10-CM | POA: Diagnosis not present

## 2022-02-03 NOTE — Patient Instructions (Signed)
[name] , ?Thank you for taking time to come for your Medicare Wellness Visit. I appreciate your ongoing commitment to your health goals. Please review the following plan we discussed and let me know if I can assist you in the future.  ? ?Screening recommendations/referrals: ?Colonoscopy: due ?Mammogram: completed 02/18/2021, due 02/19/2022 ?Bone Density: completed 07/10/2015 ?Recommended yearly ophthalmology/optometry visit for glaucoma screening and checkup ?Recommended yearly dental visit for hygiene and checkup ? ?Vaccinations: ?Influenza vaccine: completed 09/17/2021, due next flu season ?Pneumococcal vaccine: completed 04/07/2014 ?Tdap vaccine: completed 02/02/2017, due 02/03/2027 ?Shingles vaccine: completed   ?Covid-19: 08/20/2020, 01/23/2020, 12/29/2019 ? ?Advanced directives: Please bring a copy of your POA (Power of Attorney) and/or Living Will to your next appointment.  ? ?Conditions/risks identified: none ? ?Next appointment: Follow up in one year for your annual wellness visit  ? ? ?Preventive Care 74 Years and Older, Female ?Preventive care refers to lifestyle choices and visits with your health care provider that can promote health and wellness. ?What does preventive care include? ?A yearly physical exam. This is also called an annual well check. ?Dental exams once or twice a year. ?Routine eye exams. Ask your health care provider how often you should have your eyes checked. ?Personal lifestyle choices, including: ?Daily care of your teeth and gums. ?Regular physical activity. ?Eating a healthy diet. ?Avoiding tobacco and drug use. ?Limiting alcohol use. ?Practicing safe sex. ?Taking low-dose aspirin every day. ?Taking vitamin and mineral supplements as recommended by your health care provider. ?What happens during an annual well check? ?The services and screenings done by your health care provider during your annual well check will depend on your age, overall health, lifestyle risk factors, and family  history of disease. ?Counseling  ?Your health care provider may ask you questions about your: ?Alcohol use. ?Tobacco use. ?Drug use. ?Emotional well-being. ?Home and relationship well-being. ?Sexual activity. ?Eating habits. ?History of falls. ?Memory and ability to understand (cognition). ?Work and work Astronomer. ?Reproductive health. ?Screening  ?You may have the following tests or measurements: ?Height, weight, and BMI. ?Blood pressure. ?Lipid and cholesterol levels. These may be checked every 5 years, or more frequently if you are over 22 years old. ?Skin check. ?Lung cancer screening. You may have this screening every year starting at age 81 if you have a 30-pack-year history of smoking and currently smoke or have quit within the past 15 years. ?Fecal occult blood test (FOBT) of the stool. You may have this test every year starting at age 35. ?Flexible sigmoidoscopy or colonoscopy. You may have a sigmoidoscopy every 5 years or a colonoscopy every 10 years starting at age 28. ?Hepatitis C blood test. ?Hepatitis B blood test. ?Sexually transmitted disease (STD) testing. ?Diabetes screening. This is done by checking your blood sugar (glucose) after you have not eaten for a while (fasting). You may have this done every 1-3 years. ?Bone density scan. This is done to screen for osteoporosis. You may have this done starting at age 54. ?Mammogram. This may be done every 1-2 years. Talk to your health care provider about how often you should have regular mammograms. ?Talk with your health care provider about your test results, treatment options, and if necessary, the need for more tests. ?Vaccines  ?Your health care provider may recommend certain vaccines, such as: ?Influenza vaccine. This is recommended every year. ?Tetanus, diphtheria, and acellular pertussis (Tdap, Td) vaccine. You may need a Td booster every 10 years. ?Zoster vaccine. You may need this after age 51. ?Pneumococcal 13-valent  conjugate (PCV13)  vaccine. One dose is recommended after age 45. ?Pneumococcal polysaccharide (PPSV23) vaccine. One dose is recommended after age 64. ?Talk to your health care provider about which screenings and vaccines you need and how often you need them. ?This information is not intended to replace advice given to you by your health care provider. Make sure you discuss any questions you have with your health care provider. ?Document Released: 11/30/2015 Document Revised: 07/23/2016 Document Reviewed: 09/04/2015 ?Elsevier Interactive Patient Education ? 2017 [employer]. ? ?Fall Prevention in the Home ?Falls can cause injuries. They can happen to people of all ages. There are many things you can do to make your home safe and to help prevent falls. ?What can I do on the outside of my home? ?Regularly fix the edges of walkways and driveways and fix any cracks. ?Remove anything that might make you trip as you walk through a door, such as a raised step or threshold. ?Trim any bushes or trees on the path to your home. ?Use bright outdoor lighting. ?Clear any walking paths of anything that might make someone trip, such as rocks or tools. ?Regularly check to see if handrails are loose or broken. Make sure that both sides of any steps have handrails. ?Any raised decks and porches should have guardrails on the edges. ?Have any leaves, snow, or ice cleared regularly. ?Use sand or salt on walking paths during winter. ?Clean up any spills in your garage right away. This includes oil or grease spills. ?What can I do in the bathroom? ?Use night lights. ?Install grab bars by the toilet and in the tub and shower. Do not use towel bars as grab bars. ?Use non-skid mats or decals in the tub or shower. ?If you need to sit down in the shower, use a plastic, non-slip stool. ?Keep the floor dry. Clean up any water that spills on the floor as soon as it happens. ?Remove soap buildup in the tub or shower regularly. ?Attach bath mats securely with  double-sided non-slip rug tape. ?Do not have throw rugs and other things on the floor that can make you trip. ?What can I do in the bedroom? ?Use night lights. ?Make sure that you have a light by your bed that is easy to reach. ?Do not use any sheets or blankets that are too big for your bed. They should not hang down onto the floor. ?Have a firm chair that has side arms. You can use this for support while you get dressed. ?Do not have throw rugs and other things on the floor that can make you trip. ?What can I do in the kitchen? ?Clean up any spills right away. ?Avoid walking on wet floors. ?Keep items that you use a lot in easy-to-reach places. ?If you need to reach something above you, use a strong step stool that has a grab bar. ?Keep electrical cords out of the way. ?Do not use floor polish or wax that makes floors slippery. If you must use wax, use non-skid floor wax. ?Do not have throw rugs and other things on the floor that can make you trip. ?What can I do with my stairs? ?Do not leave any items on the stairs. ?Make sure that there are handrails on both sides of the stairs and use them. Fix handrails that are broken or loose. Make sure that handrails are as long as the stairways. ?Check any carpeting to make sure that it is firmly attached to the stairs. Fix any carpet that  is loose or worn. ?Avoid having throw rugs at the top or bottom of the stairs. If you do have throw rugs, attach them to the floor with carpet tape. ?Make sure that you have a light switch at the top of the stairs and the bottom of the stairs. If you do not have them, ask someone to add them for you. ?What else can I do to help prevent falls? ?Wear shoes that: ?Do not have high heels. ?Have rubber bottoms. ?Are comfortable and fit you well. ?Are closed at the toe. Do not wear sandals. ?If you use a stepladder: ?Make sure that it is fully opened. Do not climb a closed stepladder. ?Make sure that both sides of the stepladder are locked  into place. ?Ask someone to hold it for you, if possible. ?Clearly mark and make sure that you can see: ?Any grab bars or handrails. ?First and last steps. ?Where the edge of each step is. ?Use tools that help you move ar

## 2022-02-03 NOTE — Progress Notes (Signed)
?I connected with Rhiannah Dalsanto today by telephone and verified that I am speaking with the correct person using two identifiers. ?Location patient: home ?Location provider: work ?Persons participating in the virtual visit: Ezrah, Rittenour LPN. ?  ?I discussed the limitations, risks, security and privacy concerns of performing an evaluation and management service by telephone and the availability of in person appointments. I also discussed with the patient that there may be a patient responsible charge related to this service. The patient expressed understanding and verbally consented to this telephonic visit.  ?  ?Interactive audio and video telecommunications were attempted between this provider and patient, however failed, due to patient having technical difficulties OR patient did not have access to video capability.  We continued and completed visit with audio only. ? ?  ? ?Vital signs may be patient reported or missing. ? ?Subjective:  ? [name] is a 76 y.o. female who presents for an Initial Medicare Annual Wellness Visit. ? ?Review of Systems    ? ?Cardiac Risk Factors include: advanced age (>15men, >35 women);hypertension;obesity (BMI >30kg/m2) ? ?   ?Objective:  ?  ?Today's Vitals  ? 02/03/22 0941 02/03/22 0942  ?Weight: 174 lb (78.9 kg)   ?Height: 5' (1.524 m)   ?PainSc:  4   ? ?Body mass index is 33.98 kg/m?. ? ?Advanced Directives 02/03/2022 03/04/2021 02/25/2021  ?Does Patient Have a Medical Advance Directive? Yes Yes Yes  ?Type of Paramedic of [place];Living will Healthcare Power of [place]  ?Does patient want to make changes to medical advance directive? - No - Patient declined -  ?Copy of [place] in Chart? No - copy requested - -  ? ? ?Current Medications (verified) ?Outpatient Encounter Medications as of 02/03/2022  ?Medication Sig  ? acetaminophen (TYLENOL) 650 MG CR tablet Take 650 mg by mouth every 8  (eight) hours as needed for pain.  ? ALPRAZolam (XANAX) 0.5 MG tablet Take 0.25 mg by mouth at bedtime as needed for sleep.  ? aspirin 81 MG chewable tablet Chew 81 mg by mouth at bedtime.  ? Bacillus Coagulans-Inulin (PROBIOTIC) 1-250 BILLION-MG CAPS 1 capsule  ? cyclobenzaprine (FLEXERIL) 10 MG tablet Take 1 tablet (10 mg total) by mouth 3 (three) times daily as needed for muscle spasms.  ? DULoxetine (CYMBALTA) 60 MG capsule Take 60 mg by mouth at bedtime.  ? levothyroxine (SYNTHROID) 75 MCG tablet Take 75 mcg by mouth daily before breakfast.  ? lisinopril (ZESTRIL) 20 MG tablet Take 10 mg by mouth at bedtime.  ? Multiple Vitamin (MULTIVITAMIN WITH MINERALS) TABS tablet Take 1 tablet by mouth daily.  ? OZEMPIC, 0.25 OR 0.5 MG/DOSE, 2 MG/1.5ML SOPN Inject 0.25 mg into the skin once a week.  ? Probiotic Product (PROBIOTIC DAILY PO) Take 1 capsule by mouth daily.  ? traMADol (ULTRAM) 50 MG tablet Take 1 tablet (50 mg total) by mouth 2 (two) times daily as needed.  ? ?No facility-administered encounter medications on file as of 02/03/2022.  ? ? ?Allergies (verified) ?Celecoxib, Citalopram hydrobromide, Codeine, Latex, Meperidine hcl, Morphine and related, Tape, and Trazodone hcl  ? ?History: ?Past Medical History:  ?Diagnosis Date  ? Anxiety   ? Arthritis   ? Chronic back pain   ? Chronic kidney disease 2019  ? ARF while in hospital for PLIF  ? Depression   ? GERD (gastroesophageal reflux disease)   ? Hypertension   ? Hypothyroidism   ? Macular degeneration of  both eyes   ? not age related  ? PONV (postoperative nausea and vomiting)   ? also is slow to wake up  ? ?Past Surgical History:  ?Procedure Laterality Date  ? ABDOMINAL HYSTERECTOMY    ? total  ? ANTERIOR CERVICAL DISCECTOMY    ? APPENDECTOMY    ? BACK SURGERY    ? PLIF  ? BUNIONECTOMY Right   ? CHOLECYSTECTOMY    ? COLONOSCOPY    ? HAND SURGERY Left   ? thumb joint, carpal tunnel and cyst  ? HAND SURGERY Right   ? thumb and carpal tunnel  ? POSTERIOR CERVICAL  FUSION/FORAMINOTOMY    ? ?Family History  ?Problem Relation Age of Onset  ? Breast cancer Neg Hx   ? ?Social History  ? ?Socioeconomic History  ? Marital status: Married  ?  Spouse name: Not on file  ? Number of children: Not on file  ? Years of education: Not on file  ? Highest education level: Not on file  ?Occupational History  ? Not on file  ?Tobacco Use  ? Smoking status: Never  ? Smokeless tobacco: Never  ?Vaping Use  ? Vaping Use: Never used  ?Substance and Sexual Activity  ? Alcohol use: Never  ? Drug use: Never  ? Sexual activity: Not on file  ?Other Topics Concern  ? Not on file  ?Social History Narrative  ? Not on file  ? ?Social Determinants of Health  ? ?Financial Resource Strain: Low Risk   ? Difficulty of Paying Living Expenses: Not hard at all  ?Food Insecurity: No Food Insecurity  ? Worried About Charity fundraiser in the Last Year: Never true  ? Ran Out of Food in the Last Year: Never true  ?Transportation Needs: No Transportation Needs  ? Lack of Transportation (Medical): No  ? Lack of Transportation (Non-Medical): No  ?Physical Activity: Inactive  ? Days of Exercise per Week: 0 days  ? Minutes of Exercise per Session: 0 min  ?Stress: No Stress Concern Present  ? Feeling of Stress : Not at all  ?Social Connections: Not on file  ? ? ?Tobacco Counseling ?Counseling given: Not Answered ? ? ?Clinical Intake: ? ?Pre-visit preparation completed: Yes ? ?Pain : 0-10 ?Pain Score: 4  ?Pain Type: Chronic pain ?Pain Location: Back ?Pain Orientation: Upper, Mid, Lower ?Pain Radiating Towards: goes down legs ?Pain Descriptors / Indicators: Aching, Dull ?Pain Onset: More than a month ago ?Pain Frequency: Constant ?Pain Relieving Factors: tramadol and APAP ? ?Pain Relieving Factors: tramadol and APAP ? ?Nutritional Status: BMI > 30  Obese ?Nutritional Risks: None ?Diabetes: No ? ?How often do you need to have someone help you when you read instructions, pamphlets, or other written materials from your doctor or  pharmacy?: 1 - Never ?What is the last grade level you completed in school?: 12th grade ? ?Diabetic? no ? ?Interpreter Needed?: No ? ?Information entered by :: NAllen LPN ? ? ?Activities of Daily Living ?In your present state of health, do you have any difficulty performing the following activities: 02/03/2022 03/04/2021  ?Hearing? N N  ?Vision? Y N  ?Comment macular degeneration -  ?Difficulty concentrating or making decisions? Y N  ?Walking or climbing stairs? Y Y  ?Dressing or bathing? N Y  ?Doing errands, shopping? N N  ?Preparing Food and eating ? N -  ?Using the Toilet? N -  ?In the past six months, have you accidently leaked urine? Y -  ?Do you have problems  with loss of bowel control? N -  ?Managing your Medications? N -  ?Managing your Finances? N -  ?Housekeeping or managing your Housekeeping? N -  ?Some recent data might be hidden  ? ? ?Patient Care Team: ?Dorothyann Peng, NP as PCP - General (Family Medicine) ? ?Indicate any recent Medical Services you may have received from other than [facility] providers in the past year (date may be approximate). ? ?   ?Assessment:  ? This is a routine wellness examination for [name]. ? ?Hearing/Vision screen ?Vision Screening - Comments:: Regular eye exams, [facility] and Dr. Zigmund Daniel ? ?Dietary issues and exercise activities discussed: ?Current Exercise Habits: The patient does not participate in regular exercise at present ? ? Goals Addressed   ? ?  ?  ?  ?  ? This Visit's Progress  ?  Patient Stated     ?  02/03/2022, wants to continue weight loss, eating healthy ?  ? ?  ? ?Depression Screen ?PHQ 2/9 Scores 02/03/2022  ?PHQ - 2 Score 0  ?  ?Fall Risk ?Fall Risk  02/03/2022  ?Falls in the past year? 0  ?Risk for fall due to : Medication side effect  ?Follow up Falls evaluation completed;Education provided;Falls prevention discussed  ? ? ?FALL RISK PREVENTION PERTAINING TO THE HOME: ? ?Any stairs in or around the home? No  ?If so, are there any without handrails?  N/a ?Home  free of loose throw rugs in walkways, pet beds, electrical cords, etc? Yes  ?Adequate lighting in your home to reduce risk of falls? Yes  ? ?ASSISTIVE DEVICES UTILIZED TO PREVENT FALLS: ? ?Life alert? No

## 2022-03-25 ENCOUNTER — Other Ambulatory Visit: Payer: Self-pay | Admitting: Adult Health

## 2022-03-26 NOTE — Telephone Encounter (Signed)
Okay for refill?  ? ?Pt scheduled for 04/22/2022 at 11:00 AM ?

## 2022-03-27 ENCOUNTER — Other Ambulatory Visit: Payer: Self-pay | Admitting: Adult Health

## 2022-03-27 ENCOUNTER — Encounter: Payer: Self-pay | Admitting: Adult Health

## 2022-03-27 MED ORDER — DULOXETINE HCL 30 MG PO CPEP: 30.0000 mg | ORAL_CAPSULE | Freq: Every day | ORAL | 1 refills | Status: DC

## 2022-03-27 NOTE — Telephone Encounter (Signed)
Please advise 

## 2022-04-01 ENCOUNTER — Encounter: Payer: Self-pay | Admitting: Adult Health

## 2022-04-02 ENCOUNTER — Other Ambulatory Visit: Payer: Self-pay | Admitting: Adult Health

## 2022-04-02 DIAGNOSIS — I10 Essential (primary) hypertension: Secondary | ICD-10-CM

## 2022-04-02 DIAGNOSIS — E039 Hypothyroidism, unspecified: Secondary | ICD-10-CM

## 2022-04-03 ENCOUNTER — Telehealth: Payer: Self-pay | Admitting: Adult Health

## 2022-04-03 NOTE — Progress Notes (Signed)
  Care Management   Follow Up Note   04/03/2022 Name: Misti Towle MRN: [id number] DOB: [birth year]   Referred by: [name], NP Reason for referral : No chief complaint on file.   An unsuccessful telephone outreach was attempted today. The patient was referred to the case management team for assistance with care management and care coordination.   Follow Up Plan: The care management team will reach out to the patient again over the next 1 days.   SIGNATURE  [place]

## 2022-04-04 ENCOUNTER — Telehealth: Payer: Self-pay | Admitting: Adult Health

## 2022-04-04 NOTE — Progress Notes (Signed)
  Care Management   Follow Up Note   04/04/2022 Name: [name] MRN: [id number] DOB: 08/04/1946   Referred by: Dorothyann Peng, NP Reason for referral : No chief complaint on file.   A second unsuccessful telephone outreach was attempted today. The patient was referred to the case management team for assistance with care management and care coordination.   Follow Up Plan: The care management team will reach out to the patient again over the next 3 days.   [place]

## 2022-04-07 ENCOUNTER — Telehealth: Payer: Self-pay | Admitting: Adult Health

## 2022-04-07 NOTE — Progress Notes (Signed)
  Care Management   Follow Up Note   04/07/2022 Name: [name] MRN: [id number] DOB: [birth year]   Referred by: [name], NP Reason for referral : No chief complaint on file.   Third unsuccessful telephone outreach was attempted today. The patient was referred to the case management team for assistance with care management and care coordination. The patient's primary care provider has been notified of our unsuccessful attempts to make or maintain contact with the patient. The care management team is pleased to engage with this patient at any time in the future should he/she be interested in assistance from the care management team.   Follow Up Plan: No further follow up required:    SIGNATURE  Leeann Aldridge Upstream Scheduler

## 2022-04-08 DIAGNOSIS — H353132 Nonexudative age-related macular degeneration, bilateral, intermediate dry stage: Secondary | ICD-10-CM | POA: Diagnosis not present

## 2022-04-21 ENCOUNTER — Encounter: Payer: Self-pay | Admitting: Pharmacist

## 2022-04-21 DIAGNOSIS — Z9189 Other specified personal risk factors, not elsewhere classified: Secondary | ICD-10-CM

## 2022-04-21 NOTE — Progress Notes (Signed)
[facility])                                            [facility] Quality Pharmacy Team                                        Statin Quality Measure Assessment    04/21/2022  [name] 16-Jan-1946 [id number]  Per review of chart and payor information, patient has a diagnosis of diabetes but is not currently filling a statin prescription.  This places patient into the SUPD (Statin Use In Patients with Diabetes) measure for CMS.    I could not find any documentation of previous trial of a statin or a history of statin intolerance.  Patient has an upcoming PCP appointment 04/22/22.  If deemed therapeutically appropriate, statin therapy could be assessed.  There is also some mention of "prediabetes".  Patient could also have the prediabetes code associated with her visit (or another statin exclusion if applicable) which would remove her from the measure.   Please consider ONE of the following recommendations:  Initiate high intensity statin Atorvastatin 40mg  once daily, #90, 3 refills   Rosuvastatin 20mg  once daily, #90, 3 refills    Initiate moderate intensity          statin with reduced frequency if prior          statin intolerance 1x weekly, #13, 3 refills   2x weekly, #26, 3 refills   3x weekly, #39, 3 refills    Code for past statin intolerance or  other exclusions (required annually)   Provider Requirements:  Associate code during an office visit or telehealth encounter  Drug Induced Myopathy G72.0   Myopathy, unspecified G72.9   Myositis, unspecified M60.9   Rhabdomyolysis M62.82   Alcoholic fatty liver K70.0   Cirrhosis of liver K74.69   Prediabetes R73.03   PCOS E28.2   Toxic liver disease, unspecified K71.9         Plan: Route note to PCP prior to upcoming appointment.  , PharmD, BCACP [facility] Clinical Pharmacist [phone]

## 2022-04-22 ENCOUNTER — Ambulatory Visit (INDEPENDENT_AMBULATORY_CARE_PROVIDER_SITE_OTHER): Payer: Medicare Other | Admitting: Adult Health

## 2022-04-22 ENCOUNTER — Encounter: Payer: Self-pay | Admitting: Adult Health

## 2022-04-22 VITALS — BP 120/80 | HR 98 | Temp 98.6°F | Ht 60.0 in | Wt 177.0 lb

## 2022-04-22 DIAGNOSIS — R7302 Impaired glucose tolerance (oral): Secondary | ICD-10-CM | POA: Diagnosis not present

## 2022-04-22 DIAGNOSIS — I10 Essential (primary) hypertension: Secondary | ICD-10-CM

## 2022-04-22 DIAGNOSIS — E039 Hypothyroidism, unspecified: Secondary | ICD-10-CM

## 2022-04-22 DIAGNOSIS — F5101 Primary insomnia: Secondary | ICD-10-CM

## 2022-04-22 DIAGNOSIS — G8929 Other chronic pain: Secondary | ICD-10-CM | POA: Diagnosis not present

## 2022-04-22 DIAGNOSIS — F32A Depression, unspecified: Secondary | ICD-10-CM | POA: Diagnosis not present

## 2022-04-22 DIAGNOSIS — Z1159 Encounter for screening for other viral diseases: Secondary | ICD-10-CM | POA: Diagnosis not present

## 2022-04-22 DIAGNOSIS — Z1211 Encounter for screening for malignant neoplasm of colon: Secondary | ICD-10-CM

## 2022-04-22 DIAGNOSIS — E7439 Other disorders of intestinal carbohydrate absorption: Secondary | ICD-10-CM

## 2022-04-22 DIAGNOSIS — Z0000 Encounter for general adult medical examination without abnormal findings: Secondary | ICD-10-CM | POA: Diagnosis not present

## 2022-04-22 DIAGNOSIS — M549 Dorsalgia, unspecified: Secondary | ICD-10-CM

## 2022-04-22 LAB — CBC WITH DIFFERENTIAL/PLATELET
Basophils Absolute: 0 10*3/uL (ref 0.0–0.1)
Basophils Relative: 0.5 % (ref 0.0–3.0)
Eosinophils Absolute: 0.3 10*3/uL (ref 0.0–0.7)
Eosinophils Relative: 3.1 % (ref 0.0–5.0)
HCT: 42.9 % (ref 36.0–46.0)
Hemoglobin: 14.3 g/dL (ref 12.0–15.0)
Lymphocytes Relative: 28.3 % (ref 12.0–46.0)
Lymphs Abs: 2.3 10*3/uL (ref 0.7–4.0)
MCHC: 33.2 g/dL (ref 30.0–36.0)
MCV: 93.8 fl (ref 78.0–100.0)
Monocytes Absolute: 0.8 10*3/uL (ref 0.1–1.0)
Monocytes Relative: 9.3 % (ref 3.0–12.0)
Neutro Abs: 4.8 10*3/uL (ref 1.4–7.7)
Neutrophils Relative %: 58.8 % (ref 43.0–77.0)
Platelets: 313 10*3/uL (ref 150.0–400.0)
RBC: 4.57 Mil/uL (ref 3.87–5.11)
RDW: 14.4 % (ref 11.5–15.5)
WBC: 8.1 10*3/uL (ref 4.0–10.5)

## 2022-04-22 LAB — COMPREHENSIVE METABOLIC PANEL
ALT: 18 U/L (ref 0–35)
AST: 25 U/L (ref 0–37)
Albumin: 4.1 g/dL (ref 3.5–5.2)
Alkaline Phosphatase: 87 U/L (ref 39–117)
BUN: 18 mg/dL (ref 6–23)
CO2: 28 mEq/L (ref 19–32)
Calcium: 10 mg/dL (ref 8.4–10.5)
Chloride: 99 mEq/L (ref 96–112)
Creatinine, Ser: 0.96 mg/dL (ref 0.40–1.20)
GFR: 58.46 mL/min — ABNORMAL LOW (ref >60.00)
Glucose, Bld: 95 mg/dL (ref 70–99)
Potassium: 5.1 mEq/L (ref 3.5–5.1)
Sodium: 137 mEq/L (ref 135–145)
Total Bilirubin: 0.5 mg/dL (ref 0.2–1.2)
Total Protein: 7.7 g/dL (ref 6.0–8.3)

## 2022-04-22 LAB — LIPID PANEL
Cholesterol: 202 mg/dL — ABNORMAL HIGH (ref 0–200)
HDL: 61.6 mg/dL (ref >39.00)
LDL Cholesterol: 118 mg/dL — ABNORMAL HIGH (ref 0–99)
NonHDL: 139.96
Total CHOL/HDL Ratio: 3
Triglycerides: 108 mg/dL (ref 0.0–149.0)
VLDL: 21.6 mg/dL (ref 0.0–40.0)

## 2022-04-22 LAB — TSH: TSH: 0.76 u[IU]/mL (ref 0.35–5.50)

## 2022-04-22 LAB — HEMOGLOBIN A1C: Hgb A1c MFr Bld: 6 % (ref 4.6–6.5)

## 2022-04-22 NOTE — Progress Notes (Signed)
Subjective:    Patient ID: [name], female    DOB: 06/20/1946, 76 y.o.   MRN: [id number]  HPI Patient presents for yearly preventative medicine examination. She is a pleasant 77 year old female who  has a past medical history of Anxiety, Arthritis, Chronic back pain, Chronic kidney disease (2019), Depression, GERD (gastroesophageal reflux disease), Hypertension, Hypothyroidism, Macular degeneration of both eyes, and PONV (postoperative nausea and vomiting).  Hypertension -managed with lisinopril 10 mg daily.  She denies dizziness, lightheadedness, chest pain, or shortness of breath. BP Readings from Last 3 Encounters:  04/22/22 120/80  12/24/21 110/78  05/25/21 (!) 143/96   Hypothyroidism -managed with Synthroid 75 mcg daily.  Insomnia -takes Xanax 0.25 mg nightly  Prediabetes/glucose intolerance- is managed with  Ozempic 0.5 mg weekly. She does tolerate this medication well and has been able to lose roughly 30 pounds while being on it.   Wt Readings from Last 3 Encounters:  04/22/22 177 lb (80.3 kg)  02/03/22 174 lb (78.9 kg)  12/24/21 177 lb (80.3 kg)   Depression -takes Cymbalta 30 mg daily.  He does feel well controlled on this medication  Macular degeneration-is managed by [facility].  All immunizations and health maintenance protocols were reviewed with the patient and needed orders were placed.  Appropriate screening laboratory values were ordered for the patient including screening of hyperlipidemia, renal function and hepatic function. If indicated by BPH, a PSA was ordered.  Medication reconciliation,  past medical history, social history, problem list and allergies were reviewed in detail with the patient  Goals were established with regard to weight loss, exercise, and  diet in compliance with medications  She is due for colon cancer screening - does not want to have done at this time   Review of Systems  Constitutional: Negative.   HENT: Negative.     Eyes: Negative.   Respiratory: Negative.    Cardiovascular: Negative.   Gastrointestinal: Negative.   Endocrine: Negative.   Genitourinary: Negative.   Musculoskeletal:  Positive for arthralgias and back pain.  Skin: Negative.   Allergic/Immunologic: Negative.   Neurological: Negative.   Hematological: Negative.   Psychiatric/Behavioral: Negative.     Past Medical History:  Diagnosis Date   Anxiety    Arthritis    Chronic back pain    Chronic kidney disease 2019   ARF while in hospital for PLIF   Depression    GERD (gastroesophageal reflux disease)    Hypertension    Hypothyroidism    Macular degeneration of both eyes    not age related   PONV (postoperative nausea and vomiting)    also is slow to wake up    Social History   Socioeconomic History   Marital status: Married    Spouse name: Not on file   Number of children: Not on file   Years of education: Not on file   Highest education level: Not on file  Occupational History   Not on file  Tobacco Use   Smoking status: Never   Smokeless tobacco: Never  Vaping Use   Vaping Use: Never used  Substance and Sexual Activity   Alcohol use: Never   Drug use: Never   Sexual activity: Not on file  Other Topics Concern   Not on file  Social History Narrative   Not on file   Social Determinants of Health   Financial Resource Strain: Low Risk    Difficulty of Paying Living Expenses: Not hard at all  Food Insecurity: No Food Insecurity   Worried About Programme researcher, broadcasting/film/videounning Out of Food in the Last Year: Never true   Ran Out of Food in the Last Year: Never true  Transportation Needs: No Transportation Needs   Lack of Transportation (Medical): No   Lack of Transportation (Non-Medical): No  Physical Activity: Inactive   Days of Exercise per Week: 0 days   Minutes of Exercise per Session: 0 min  Stress: No Stress Concern Present   Feeling of Stress : Not at all  Social Connections: Not on file  Intimate Partner Violence: Not  on file    Past Surgical History:  Procedure Laterality Date   ABDOMINAL HYSTERECTOMY     total   ANTERIOR CERVICAL DISCECTOMY     APPENDECTOMY     BACK SURGERY     PLIF   BUNIONECTOMY Right    CHOLECYSTECTOMY     COLONOSCOPY     HAND SURGERY Left    thumb joint, carpal tunnel and cyst   HAND SURGERY Right    thumb and carpal tunnel   POSTERIOR CERVICAL FUSION/FORAMINOTOMY      Family History  Problem Relation Age of Onset   Breast cancer Neg Hx     Allergies  Allergen Reactions   Celecoxib     Other reaction(s): itching   Citalopram Hydrobromide     Other reaction(s): nausea   Codeine Nausea Only and Other (See Comments)    Severe headache   Latex Itching    Other reaction(s): itching----GLOVES   Meperidine Hcl     Other reaction(s): itching   Morphine And Related Nausea And Vomiting    And headaches   Tape Itching   Trazodone Hcl     Other reaction(s): nightmares    Current Outpatient Medications on File Prior to Visit  Medication Sig Dispense Refill   acetaminophen (TYLENOL) 650 MG CR tablet Take 650 mg by mouth every 8 (eight) hours as needed for pain.     ALPRAZolam (XANAX) 0.5 MG tablet Take 0.25 mg by mouth at bedtime as needed for sleep.     aspirin 81 MG chewable tablet Chew 81 mg by mouth at bedtime.     Bacillus Coagulans-Inulin (PROBIOTIC) 1-250 BILLION-MG CAPS 1 capsule     cyclobenzaprine (FLEXERIL) 10 MG tablet Take 1 tablet (10 mg total) by mouth 3 (three) times daily as needed for muscle spasms. 30 tablet 0   DULoxetine (CYMBALTA) 30 MG capsule Take 1 capsule (30 mg total) by mouth at bedtime. 90 capsule 1   levothyroxine (SYNTHROID) 75 MCG tablet Take 75 mcg by mouth daily before breakfast.     lisinopril (ZESTRIL) 20 MG tablet Take 10 mg by mouth at bedtime.     Multiple Vitamin (MULTIVITAMIN WITH MINERALS) TABS tablet Take 1 tablet by mouth daily.     Probiotic Product (PROBIOTIC DAILY PO) Take 1 capsule by mouth daily.     traMADol  (ULTRAM) 50 MG tablet TAKE ONE TABLET BY MOUTH TWICE A DAY AS NEEDED 30 tablet 2   No current facility-administered medications on file prior to visit.    BP 120/80   Pulse 98   Temp 98.6 F (37 C) (Oral)   Ht 5' (1.524 m)   Wt 177 lb (80.3 kg)   SpO2 98%   BMI 34.57 kg/m       Objective:   Physical Exam Vitals and nursing note reviewed.  Constitutional:      General: She is not in acute distress.  Appearance: Normal appearance. She is well-developed. She is obese. She is not ill-appearing.  HENT:     Head: Normocephalic and atraumatic.     Right Ear: Tympanic membrane, ear canal and external ear normal. There is no impacted cerumen.     Left Ear: Tympanic membrane, ear canal and external ear normal. There is no impacted cerumen.     Nose: Nose normal. No congestion or rhinorrhea.     Mouth/Throat:     Mouth: Mucous membranes are moist.     Pharynx: Oropharynx is clear. No oropharyngeal exudate or posterior oropharyngeal erythema.  Eyes:     General:        Right eye: No discharge.        Left eye: No discharge.     Extraocular Movements: Extraocular movements intact.     Conjunctiva/sclera: Conjunctivae normal.     Pupils: Pupils are equal, round, and reactive to light.  Neck:     Thyroid: No thyromegaly.     Vascular: No carotid bruit.     Trachea: No tracheal deviation.  Cardiovascular:     Rate and Rhythm: Normal rate and regular rhythm.     Pulses: Normal pulses.     Heart sounds: Normal heart sounds. No murmur heard.   No friction rub. No gallop.  Pulmonary:     Effort: Pulmonary effort is normal. No respiratory distress.     Breath sounds: Normal breath sounds. No stridor. No wheezing, rhonchi or rales.  Chest:     Chest wall: No tenderness.  Abdominal:     General: Abdomen is flat. Bowel sounds are normal. There is no distension.     Palpations: Abdomen is soft. There is no mass.     Tenderness: There is no abdominal tenderness. There is no right CVA  tenderness, left CVA tenderness, guarding or rebound.     Hernia: No hernia is present.  Musculoskeletal:        General: No swelling, tenderness, deformity or signs of injury. Normal range of motion.     Cervical back: Normal range of motion and neck supple.     Right lower leg: No edema.     Left lower leg: No edema.  Lymphadenopathy:     Cervical: No cervical adenopathy.  Skin:    General: Skin is warm and dry.     Coloration: Skin is not jaundiced or pale.     Findings: No bruising, erythema, lesion or rash.  Neurological:     General: No focal deficit present.     Mental Status: She is alert and oriented to person, place, and time.     Cranial Nerves: No cranial nerve deficit.     Sensory: No sensory deficit.     Motor: No weakness.     Coordination: Coordination normal.     Gait: Gait normal.     Deep Tendon Reflexes: Reflexes normal.  Psychiatric:        Mood and Affect: Mood normal.        Behavior: Behavior normal.        Thought Content: Thought content normal.        Judgment: Judgment normal.      Assessment & Plan:  1. Routine general medical examination at a health care facility - Follow up in one year or sooner if needed - Continue to eat healthy and stay active  - CBC with Differential/Platelet; Future - Comprehensive metabolic panel; Future - Hemoglobin A1c; Future - Lipid panel; Future -  TSH; Future - TSH - Lipid panel - Hemoglobin A1c - Comprehensive metabolic panel - CBC with Differential/Platelet  2. Primary hypertension - Well controlled with lisinopril 10 mg  - CBC with Differential/Platelet; Future - Comprehensive metabolic panel; Future - Hemoglobin A1c; Future - Lipid panel; Future - TSH; Future - TSH - Lipid panel - Hemoglobin A1c - Comprehensive metabolic panel - CBC with Differential/Platelet  3. Acquired hypothyroidism - Consider dose increase of synthroid  - CBC with Differential/Platelet; Future - Comprehensive metabolic  panel; Future - Hemoglobin A1c; Future - Lipid panel; Future - TSH; Future - TSH - Lipid panel - Hemoglobin A1c - Comprehensive metabolic panel - CBC with Differential/Platelet  4. Depression, unspecified depression type - Well controlled with Cymblata 30 mg - CBC with Differential/Platelet; Future - Comprehensive metabolic panel; Future - Hemoglobin A1c; Future - Lipid panel; Future - TSH; Future - TSH - Lipid panel - Hemoglobin A1c - Comprehensive metabolic panel - CBC with Differential/Platelet  5. Chronic midline back pain, unspecified back location - contine with Tramadol and Flexeril PRN. Follow up with orthopedics as directed  6. Primary insomnia - Continue withXanax 0.25 mg QHS PRN   8. Glucose intolerance - Continue with Ozempic 0.5 mg weekly.  - CBC with Differential/Platelet; Future - Comprehensive metabolic panel; Future - Hemoglobin A1c; Future - Lipid panel; Future - TSH; Future - TSH - Lipid panel - Hemoglobin A1c - Comprehensive metabolic panel - CBC with Differential/Platelet  9. Need for hepatitis C screening test  - Hep C Antibody; Future - Hep C Antibody  10. Colon cancer screening - Refused. Knows risks   Dorothyann Peng, NP

## 2022-04-23 ENCOUNTER — Telehealth: Payer: Self-pay | Admitting: Adult Health

## 2022-04-23 LAB — HEPATITIS C ANTIBODY
Hepatitis C Ab: NONREACTIVE
SIGNAL TO CUT-OFF: 0.03 (ref <1.00)

## 2022-04-23 MED ORDER — SIMVASTATIN 5 MG PO TABS: 5.0000 mg | ORAL_TABLET | Freq: Every day | ORAL | 3 refills | Status: DC

## 2022-04-23 NOTE — Progress Notes (Signed)
  Care Management   Follow Up Note   04/23/2022 Name: [name] MRN: [id number] DOB: [birth year]   Referred by: Dorothyann Peng, NP Reason for referral : No chief complaint on file.   An unsuccessful telephone outreach was attempted today. The patient was referred to the case management team for assistance with care management and care coordination.   Follow Up Plan: The care management team will reach out to the patient again over the next 7 days.   [place]

## 2022-04-23 NOTE — Telephone Encounter (Signed)
Updated patient on her labs.  Her 10-year cardiac risk is currently at roughly 17%.  She is okay with starting statin.  We will start her off on simvastatin 5 mg daily.  Other labs are normal

## 2022-04-23 NOTE — Chronic Care Management (AMB) (Signed)
  Care Management  Note   04/23/2022 Name: [name] MRN: [id number] DOB: Nov 15, 1946  [name] is a 76 y.o. year old female who is a primary care patient of Dorothyann Peng, NP. The care management team was consulted for assistance with chronic disease management and care coordination needs.   [name] was given information about Care Management services today including:  CCM service includes personalized support from designated clinical staff supervised by the physician, including individualized plan of care and coordination with other care providers 24/7 contact phone numbers for assistance for urgent and routine care needs. Service will only be billed when office clinical staff spend 20 minutes or more in a month to coordinate care. Only one practitioner may furnish and bill the service in a calendar month. The patient may stop CCM services at amy time (effective at the end of the month) by phone call to the office staff. The patient will be responsible for cost sharing (co-pay) or up to 20% of the service fee (after annual deductible is met)  Patient agreed to services and verbal consent obtained.  Follow up plan:   Face to Face appointment with care management team member scheduled for: 05/07/22 $RemoveBefo'@11am'[email or web address]$   [place]

## 2022-05-02 ENCOUNTER — Telehealth: Payer: Self-pay | Admitting: Pharmacist

## 2022-05-02 NOTE — Chronic Care Management (AMB) (Signed)
Chronic Care Management Pharmacy Assistant   Name: [name]  MRN: [id number] DOB: [birth year]  Reason for Encounter: Chart Prep for initial encounter with [name] Clinical Pharmacist on 05/07/22 at 11 am in office.   Conditions to be addressed/monitored: HTN, Depression, and Hypothyroidism   Recent office visits:  04/22/22 Angel Frees, NP - Patient presented for Routine general medical exam at a health care facility and other concerns. No medication changes Prescribed Rosuvastatin after labwork.  02/13/22 Angel Merino, LPN - Patient presented via telephone for Medicare Annual Wellness exam. No medication changes.  02/03/22 [name], [name] - Claims encounter for general adult medical examination without abnormal findings.  12/24/21 Angel Frees, NP - Patient presented for Acquired hypothyroidism and other concerns. Stopped Biotin. Stopped Oxycodone.  Recent consult visits:  11/17/21 [name], DPM (Podiatry) - Patient presented for Pain due to onychomycosis of toenails of both feet. No medication changes.  Hospital visits:  None in previous 6 months  Medications: Outpatient Encounter Medications as of 05/02/2022  Medication Sig   acetaminophen (TYLENOL) 650 MG CR tablet Take 650 mg by mouth every 8 (eight) hours as needed for pain.   ALPRAZolam (XANAX) 0.5 MG tablet Take 0.25 mg by mouth at bedtime as needed for sleep.   aspirin 81 MG chewable tablet Chew 81 mg by mouth at bedtime.   Bacillus Coagulans-Inulin (PROBIOTIC) 1-250 BILLION-MG CAPS 1 capsule   cyclobenzaprine (FLEXERIL) 10 MG tablet Take 1 tablet (10 mg total) by mouth 3 (three) times daily as needed for muscle spasms.   DULoxetine (CYMBALTA) 30 MG capsule Take 1 capsule (30 mg total) by mouth at bedtime.   levothyroxine (SYNTHROID) 75 MCG tablet Take 75 mcg by mouth daily before breakfast.   lisinopril (ZESTRIL) 20 MG tablet Take 10 mg by mouth at bedtime.   Multiple Vitamin (MULTIVITAMIN WITH  MINERALS) TABS tablet Take 1 tablet by mouth daily.   Probiotic Product (PROBIOTIC DAILY PO) Take 1 capsule by mouth daily.   simvastatin (ZOCOR) 5 MG tablet Take 1 tablet (5 mg total) by mouth daily.   traMADol (ULTRAM) 50 MG tablet TAKE ONE TABLET BY MOUTH TWICE A DAY AS NEEDED   No facility-administered encounter medications on file as of 05/02/2022.   Fill History Gaps : ALPRAZOLAM  0.5 MG TABS 04/27/2022 15   cyclobenzaprine 10 mg tablet 04/08/2021   CYCLOBENZAPRINE HYDROCHLORIDE  10 MG TABS 02/26/2021 10   DULOXETINE HCL  30 MG CPEP 03/28/2022 90   LEVOTHYROXINE SODIUM  75 MCG TABS 01/07/2022 90   LISINOPRIL  10 MG TABS 03/26/2022 90   OZEMPIC  2 MG/3ML SOPN 04/21/2022 56   SIMVASTATIN  5 MG TABS 04/23/2022 90   TRAMADOL HYDROCHLORIDE  50 MG TABS 04/27/2022 15   Ozempic not showing in active medication list  Have you seen any other providers since your last visit? Patient reports none  Any changes in your medications or health? Patient reports no  Any side effects from any medications? Patient reports none   Do you have an symptoms or problems not managed by your medications? Patient reports no she thinks all her medications are working well and appropriately  Any concerns about your health right now? Patient reports none  Has your provider asked that you check blood pressure, blood sugar, or follow special diet at home? Patient reports she has a wrist cuff does not use a often as she should will bring with her to appointment  Do you get any  type of exercise on a regular basis? Patient reports for extended walking times she will use her walker but otherwise go without has lost 3 pounds recently trying to loose some weight  Do you have any problems getting your medications? Patient reports she is happy with the cost of her medications finding them affordable and is happy with her pharmacy.  Is there anything that you would like to discuss during the appointment?  Patient reports no     Care Gaps: COVID Booster - Overdue Colonoscopy - Overdue BP- 120/80 (04/22/22) AWV- 3/23  Star Rating Drugs: Lisinopril 10 mg - Last filled 03/26/22 90 DS at [facility] Semaglutide 2mg /77ml - Last filled 04/21/22 56 DS at 06/21/22 CMA Clinical Pharmacist Assistant [phone]

## 2022-05-07 ENCOUNTER — Telehealth: Payer: Self-pay | Admitting: *Deleted

## 2022-05-07 ENCOUNTER — Ambulatory Visit: Payer: Medicare Other

## 2022-05-07 NOTE — Chronic Care Management (AMB) (Signed)
  Chronic Care Management Note  05/07/2022 Name: Alisson Rozell MRN: [id number] DOB: 1945-12-17  Avani Sensabaugh is a 76 y.o. year old female who is a primary care patient of Nafziger, Kandee Keen, NP and is actively engaged with the care management team. I reached out to Tillie Rung by phone today to assist with re-scheduling an initial visit with the Pharmacist  Follow up plan: Unsuccessful telephone outreach attempt made. A HIPAA compliant phone message was left for the patient providing contact information and requesting a return call.   Burman Nieves, CCMA Care Guide, Embedded Care Coordination [facility]  Care Management  Direct Dial: [phone]

## 2022-05-07 NOTE — Progress Notes (Deleted)
Chronic Care Management Pharmacy Note  05/07/2022 Name:  [name] MRN:  [id number] DOB:  [birth year]  Summary: ***  Recommendations/Changes made from today's visit: ***  Plan: ***   Subjective: [name] is an 76 y.o. year old female who is a primary patient of Dorothyann Peng, NP.  The CCM team was consulted for assistance with disease management and care coordination needs.    Engaged with patient face to face for initial visit in response to provider referral for pharmacy case management and/or care coordination services.   Consent to Services:  The patient was given the following information about Chronic Care Management services today, agreed to services, and gave verbal consent: 1. CCM service includes personalized support from designated clinical staff supervised by the primary care provider, including individualized plan of care and coordination with other care providers 2. 24/7 contact phone numbers for assistance for urgent and routine care needs. 3. Service will only be billed when office clinical staff spend 20 minutes or more in a month to coordinate care. 4. Only one practitioner may furnish and bill the service in a calendar month. 5.The patient may stop CCM services at any time (effective at the end of the month) by phone call to the office staff. 6. The patient will be responsible for cost sharing (co-pay) of up to 20% of the service fee (after annual deductible is met). Patient agreed to services and consent obtained.  Patient Care Team: Dorothyann Peng, NP as PCP - General (Family Medicine) Viona Gilmore, [facility] as Pharmacist (Pharmacist)  Recent office visits: 04/22/22 Dorothyann Peng, NP - Patient presented for Routine general medical exam at a health care facility and other concerns. No medication changes Prescribed simvastatin after labwork.   02/13/22 Kellie Simmering, LPN - Patient presented via telephone for Medicare Annual Wellness exam. No medication  changes.   12/24/21 Dorothyann Peng, NP - Patient presented for Acquired hypothyroidism and other concerns. Stopped Biotin. Stopped Oxycodone.  Recent consult visits: 11/17/21 Felipa Furnace, DPM (Podiatry) - Patient presented for Pain due to onychomycosis of toenails of both feet. No medication changes.  Hospital visits: None in previous 6 months   Objective:  Lab Results  Component Value Date   CREATININE 0.96 04/22/2022   BUN 18 04/22/2022   GFR 58.46 (L) 04/22/2022   GFRNONAA >60 03/03/2021   GFRAA  03/20/2008    >60        The eGFR has been calculated using the MDRD equation. This calculation has not been validated in all clinical   NA 137 04/22/2022   K 5.1 04/22/2022   CALCIUM 10.0 04/22/2022   CO2 28 04/22/2022   GLUCOSE 95 04/22/2022    Lab Results  Component Value Date/Time   HGBA1C 6.0 04/22/2022 11:44 AM   GFR 58.46 (L) 04/22/2022 11:44 AM    Last diabetic Eye exam: No results found for: "HMDIABEYEEXA"  Last diabetic Foot exam: No results found for: "HMDIABFOOTEX"   Lab Results  Component Value Date   CHOL 202 (H) 04/22/2022   HDL 61.60 04/22/2022   LDLCALC 118 (H) 04/22/2022   TRIG 108.0 04/22/2022   CHOLHDL 3 04/22/2022       Latest Ref Rng & Units 04/22/2022   11:44 AM 03/03/2021    5:57 PM 03/20/2008   12:01 PM  Hepatic Function  Total Protein 6.0 - 8.3 g/dL 7.7  7.5  7.7   Albumin 3.5 - 5.2 g/dL 4.1  3.6  4.1   AST 0 -  37 U/L 25  26  29    ALT 0 - 35 U/L 18  25  25    Alk Phosphatase 39 - 117 U/L 87  85  85   Total Bilirubin 0.2 - 1.2 mg/dL 0.5  0.8  0.6     Lab Results  Component Value Date/Time   TSH 0.76 04/22/2022 11:44 AM       Latest Ref Rng & Units 04/22/2022   11:44 AM 03/04/2021    7:54 AM 03/03/2021    5:57 PM  CBC  WBC 4.0 - 10.5 K/uL 8.1  6.5  11.8   Hemoglobin 12.0 - 15.0 g/dL 14.3  12.3  14.2   Hematocrit 36.0 - 46.0 % 42.9  37.4  42.9   Platelets 150.0 - 400.0 K/uL 313.0  238  351     No results found for:  "VD25OH"  Clinical ASCVD: {YES/NO:21197} The 10-year ASCVD risk score (Arnett DK, et al., 2019) is: 16.9%   Values used to calculate the score:     Age: 9 years     Sex: Female     Is Non-Hispanic African American: No     Diabetic: No     Tobacco smoker: No     Systolic Blood Pressure: 334 mmHg     Is BP treated: Yes     HDL Cholesterol: 61.6 mg/dL     Total Cholesterol: 202 mg/dL       04/22/2022   11:15 AM 02/03/2022    9:54 AM  Depression screen PHQ 2/9  Decreased Interest 0 0  Down, Depressed, Hopeless 0 0  PHQ - 2 Score 0 0  Altered sleeping 0   Tired, decreased energy 1   Change in appetite 0   Feeling bad or failure about yourself  0   Trouble concentrating 0   Moving slowly or fidgety/restless 0   Suicidal thoughts 0   PHQ-9 Score 1   Difficult doing work/chores Not difficult at all      ***Other: (CHADS2VASc if Afib, MMRC or CAT for COPD, ACT, DEXA)  Social History   Tobacco Use  Smoking Status Never  Smokeless Tobacco Never   BP Readings from Last 3 Encounters:  04/22/22 120/80  12/24/21 110/78  05/25/21 (!) 143/96   Pulse Readings from Last 3 Encounters:  04/22/22 98  12/24/21 (!) 104  05/25/21 100   Wt Readings from Last 3 Encounters:  04/22/22 177 lb (80.3 kg)  02/03/22 174 lb (78.9 kg)  12/24/21 177 lb (80.3 kg)   BMI Readings from Last 3 Encounters:  04/22/22 34.57 kg/m  02/03/22 33.98 kg/m  12/24/21 35.15 kg/m    Assessment/Interventions: Review of patient past medical history, allergies, medications, health status, including review of consultants reports, laboratory and other test data, was performed as part of comprehensive evaluation and provision of chronic care management services.   SDOH:  (Social Determinants of Health) assessments and interventions performed: {yes/no:20286}  SDOH Screenings   Alcohol Screen: Not on file  Depression (PHQ2-9): Low Risk  (04/22/2022)   Depression (PHQ2-9)    PHQ-2 Score: 1  Financial  Resource Strain: Low Risk  (02/03/2022)   Overall Financial Resource Strain (CARDIA)    Difficulty of Paying Living Expenses: Not hard at all  Food Insecurity: No Food Insecurity (02/03/2022)   Hunger Vital Sign    Worried About Running Out of Food in the Last Year: Never true    Ran Out of Food in the Last Year: Never true  Housing: Not  on file  Physical Activity: Inactive (02/03/2022)   Exercise Vital Sign    Days of Exercise per Week: 0 days    Minutes of Exercise per Session: 0 min  Social Connections: Not on file  Stress: No Stress Concern Present (02/03/2022)   [place]    Feeling of Stress : Not at all  Tobacco Use: Low Risk  (04/22/2022)   Patient History    Smoking Tobacco Use: Never    Smokeless Tobacco Use: Never    Passive Exposure: Not on file  Transportation Needs: No Transportation Needs (02/03/2022)   PRAPARE - Transportation    Lack of Transportation (Medical): No    Lack of Transportation (Non-Medical): No   Patient reports she has a wrist cuff does not use a often as she should will bring with her to appointment  Patient reports for extended walking times she will use her walker but otherwise go without has lost 3 pounds recently trying to loose some weight    CCM Care Plan  Allergies  Allergen Reactions   Celecoxib     Other reaction(s): itching   Citalopram Hydrobromide     Other reaction(s): nausea   Codeine Nausea Only and Other (See Comments)    Severe headache   Latex Itching    Other reaction(s): itching----GLOVES   Meperidine Hcl     Other reaction(s): itching   Morphine And Related Nausea And Vomiting    And headaches   Tape Itching   Trazodone Hcl     Other reaction(s): nightmares    Medications Reviewed Today     Reviewed by Dorothyann Peng, NP (Nurse Practitioner) on 04/22/22 at [place] List Status: <None>   Medication Order Taking? Sig Documenting Provider Last Dose  Status Informant  acetaminophen (TYLENOL) 650 MG CR tablet [id number] Yes Take 650 mg by mouth every 8 (eight) hours as needed for pain. [provider] Taking Active Self  ALPRAZolam (XANAX) 0.5 MG tablet [id number] Yes Take 0.25 mg by mouth at bedtime as needed for sleep. [provider] Taking Active Self  aspirin 81 MG chewable tablet [id number] Yes Chew 81 mg by mouth at bedtime. [provider] Taking Active Self  Bacillus Coagulans-Inulin (PROBIOTIC) 1-250 BILLION-MG CAPS [id number] Yes 1 capsule [provider] Taking Active   cyclobenzaprine (FLEXERIL) 10 MG tablet [id number] Yes Take 1 tablet (10 mg total) by mouth 3 (three) times daily as needed for muscle spasms. Eleonore Chiquito, NP Taking Active Self  DULoxetine (CYMBALTA) 30 MG capsule [id number] Yes Take 1 capsule (30 mg total) by mouth at bedtime. Nafziger, Tommi Rumps, NP Taking Active   levothyroxine (SYNTHROID) 75 MCG tablet [id number] Yes Take 75 mcg by mouth daily before breakfast. [provider] Taking Active Self  lisinopril (ZESTRIL) 20 MG tablet [id number] Yes Take 10 mg by mouth at bedtime. [provider] Taking Active Self  Multiple Vitamin (MULTIVITAMIN WITH MINERALS) TABS tablet [id number] Yes Take 1 tablet by mouth daily. [provider] Taking Active Self  Probiotic Product (PROBIOTIC DAILY PO) [id number] Yes Take 1 capsule by mouth daily. [provider] Taking Active Self  traMADol (ULTRAM) 50 MG tablet [id number] Yes TAKE ONE TABLET BY MOUTH TWICE A DAY AS NEEDED Dorothyann Peng, NP Taking Active             Patient Active Problem List   Diagnosis Date Noted   Hypothyroidism 12/24/2021   Hypertension 12/24/2021   Depression 12/24/2021  Fever 03/04/2021   Fever and chills 03/04/2021   S/P lumbar fusion 02/25/2021    Immunization History  Administered Date(s) Administered   Influenza Split 08/05/2010, 08/03/2012, 09/18/2013, 08/15/2014,  08/27/2016, 07/24/2017, 08/01/2019, 09/17/2021   Influenza, High Dose Seasonal PF 08/07/2015, 07/26/2018, 07/27/2019   PFIZER(Purple Top)SARS-COV-2 Vaccination 12/29/2019, 01/23/2020, 08/20/2020   Pneumococcal Conjugate-13 04/07/2014   Pneumococcal Polysaccharide-23 04/05/2013   Tdap 04/03/2008, 02/02/2017   Zoster Recombinat (Shingrix) 04/22/2017, 07/24/2017   Zoster, Live 04/03/2008, 04/22/2017, 07/24/2017    Conditions to be addressed/monitored:  Hypertension, Hyperlipidemia, Hypothyroidism, Depression, Anxiety, and Pre-diabetes  There are no care plans that you recently modified to display for this patient.   Current Barriers:  {pharmacybarriers:24917}  Pharmacist Clinical Goal(s):  Patient will {PHARMACYGOALCHOICES:24921} through collaboration with PharmD and provider.   Interventions: 1:1 collaboration with Dorothyann Peng, NP regarding development and update of comprehensive plan of care as evidenced by provider attestation and co-signature Inter-disciplinary care team collaboration (see longitudinal plan of care) Comprehensive medication review performed; medication list updated in electronic medical record  BP Readings from Last 3 Encounters:  04/22/22 120/80  12/24/21 110/78  05/25/21 (!) 143/96   Hypertension (BP goal <130/80) -Not ideally controlled -Current treatment: Lisinopril 20 mg 1 tablet at bedtime -Medications previously tried: ***  -Current home readings: *** -Current dietary habits: *** -Current exercise habits: *** -{ACTIONS;DENIES/REPORTS:21021675::"Denies"} hypotensive/hypertensive symptoms -Educated on {CCM BP Counseling:25124} -Counseled to monitor BP at home ***, document, and provide log at future appointments -{CCMPHARMDINTERVENTION:25122}  Lab Results  Component Value Date   CHOL 202 (H) 04/22/2022   HDL 61.60 04/22/2022   LDLCALC 118 (H) 04/22/2022   TRIG 108.0 04/22/2022   CHOLHDL 3 04/22/2022    Hyperlipidemia: (LDL goal <  100) -Uncontrolled -Current treatment: Simvastatin 5 mg 1 tablet daily -Medications previously tried: ***  -Current dietary patterns: *** -Current exercise habits: *** -Educated on {CCM HLD Counseling:25126} -{CCMPHARMDINTERVENTION:25122}  Lab Results  Component Value Date   HGBA1C 6.0 04/22/2022    Pre-diabetes (A1c goal <6.5%) -{US controlled/uncontrolled:25276} -Current medications: Ozempic 0.25 mg inject once weekly -Medications previously tried: ***  -Current home glucose readings fasting glucose: *** post prandial glucose: *** -{ACTIONS;DENIES/REPORTS:21021675::"Denies"} hypoglycemic/hyperglycemic symptoms -Current meal patterns:  breakfast: ***  lunch: ***  dinner: *** snacks: *** drinks: *** -Current exercise: *** -Educated on {CCM DM COUNSELING:25123} -Counseled to check feet daily and get yearly eye exams -{CCMPHARMDINTERVENTION:25122}  Depression/Anxiety/Insomnia (Goal: ***) -{US controlled/uncontrolled:25276} -Current treatment: Duloxetine 30 mg 1 capsule at bedtime Alprazolam 0.5 mg 1/2 tablet at bedtime as needed? -Medications previously tried/failed: *** -PHQ9: *** -GAD7: *** -Connected with *** for mental health support -Educated on {CCM mental health counseling:25127} -{CCMPHARMDINTERVENTION:25122}  Osteopenia (Goal ***) -{US controlled/uncontrolled:25276} -Last DEXA Scan: report??   T-Score femoral neck: ***  T-Score total hip: ***  T-Score lumbar spine: ***  T-Score forearm radius: ***  10-year probability of major osteoporotic fracture: ***  10-year probability of hip fracture: *** -Patient {is;is not an osteoporosis candidate:23886} -Current treatment  Multivitamin 1 tablet daily (ca & D?) -Medications previously tried: ***  -{Osteoporosis Counseling:23892} -{CCMPHARMDINTERVENTION:25122}   Lab Results  Component Value Date   TSH 0.76 04/22/2022    Hypothyroidism (Goal: ***) -{US controlled/uncontrolled:25276} -Current  treatment  Levothyroxine 75 mcg 1 tablet daily before breakfast -Medications previously tried: ***  -{CCMPHARMDINTERVENTION:25122}  Pain/muscle spasms (Goal: ***) -{US controlled/uncontrolled:25276} -Current treatment  Cyclobenzaprine 10 mg 1 tablet three times daily as needed Tramadol 50 mg 1 tablet twice daily as needed -Medications previously tried: ***  -{CCMPHARMDINTERVENTION:25122}   Health  Maintenance -Vaccine gaps: COVID booster -Current therapy:  Aspirin 81 mg 1 tablet daily - need? Probiotic daily Multivitamin 1 tablet daily Acetaminophen 650 mg 1 tablet as needed -Educated on {ccm supplement counseling:25128} -{CCM Patient satisfied:25129} -{CCMPHARMDINTERVENTION:25122}  Patient Goals/Self-Care Activities Patient will:  - {pharmacypatientgoals:24919}  Follow Up Plan: {CM FOLLOW UP GFQM:21031}   Medication Assistance: {MEDASSISTANCEINFO:25044}  Compliance/Adherence/Medication fill history: Care Gaps: ***  Star-Rating Drugs: Lisinopril 10 mg - Last filled 03/26/22 90 DS at [facility] 57m/3ml - [facility]22 [facility] [place] [facility]  [place] [id number] [id number]: [phone]: [phone] Uses pill box? {Yes or If no, why not?:20788} Pt endorses ***% compliance  We discussed: {Pharmacy options:24294} [facility] Plan and Follow Up Patient Decision:  {FOLLOWUP:24991}  Plan: {CM FOLLOW UP PDIXB:84784} MJeni Salles PharmD, [facility]Red [facility] HospitalClinical Pharmacist {MP practice sites:26434} [phone]

## 2022-05-12 NOTE — Chronic Care Management (AMB) (Signed)
  Chronic Care Management Note  05/12/2022 Name: [name] MRN: [id number] DOB: 07/11/1946  [name] is a 76 y.o. year old female who is a primary care patient of Nafziger, Kandee Keen, NP and is actively engaged with the care management team. I reached out to Tillie Rung by phone today to assist with re-scheduling an initial visit with the Pharmacist  Follow up plan: 2nd Unsuccessful telephone outreach attempt made. A HIPAA compliant phone message was left for the patient providing contact information and requesting a return call.   Burman Nieves, CCMA Care Guide, Embedded Care Coordination [facility]  Care Management  Direct Dial: [phone]

## 2022-05-28 ENCOUNTER — Encounter: Payer: Self-pay | Admitting: Adult Health

## 2022-05-29 NOTE — Telephone Encounter (Signed)
Please advise 

## 2022-06-19 ENCOUNTER — Telehealth: Payer: Self-pay | Admitting: Pharmacist

## 2022-06-19 NOTE — Chronic Care Management (AMB) (Signed)
Chronic Care Management Pharmacy Assistant   Name: [name]  MRN: [id number] DOB: December 09, 1945  [name] is an 76 y.o. year old female who presents for her initial CCM visit with the clinical pharmacist.  Reason for Encounter: Chart prep for initial visit with Gaylord Shih the clinical pharmacist on 06/23/2022 at 3:00 in office.   Conditions to be addressed/monitored: HTN, Depression, and Hypothyroidism  Recent office visits:  04/22/22 [name], NP - Patient presented for Routine general medical exam at a health care facility and other concerns. No medication changes   02/13/22 [name], LPN - Patient presented via telephone for Medicare Annual Wellness exam. No medication changes.   02/03/22 [name], [name] - Claims encounter for general adult medical examination without abnormal findings.   12/24/21 [name], NP - Patient presented for Acquired hypothyroidism and other concerns. Discontinued Biotin and Oxycodone.  Recent consult visits:  11/17/21 [name], DPM (Podiatry) - Patient presented for Pain due to onychomycosis of toenails of both feet. No medication changes.  Hospital visits:  None  Medications: Outpatient Encounter Medications as of 06/19/2022  Medication Sig   acetaminophen (TYLENOL) 650 MG CR tablet Take 650 mg by mouth every 8 (eight) hours as needed for pain.   ALPRAZolam (XANAX) 0.5 MG tablet Take 0.25 mg by mouth at bedtime as needed for sleep.   aspirin 81 MG chewable tablet Chew 81 mg by mouth at bedtime.   Bacillus Coagulans-Inulin (PROBIOTIC) 1-250 BILLION-MG CAPS 1 capsule   cyclobenzaprine (FLEXERIL) 10 MG tablet Take 1 tablet (10 mg total) by mouth 3 (three) times daily as needed for muscle spasms.   DULoxetine (CYMBALTA) 30 MG capsule Take 1 capsule (30 mg total) by mouth at bedtime.   levothyroxine (SYNTHROID) 75 MCG tablet Take 75 mcg by mouth daily before breakfast.   lisinopril (ZESTRIL) 20 MG tablet Take 10 mg by  mouth at bedtime.   Multiple Vitamin (MULTIVITAMIN WITH MINERALS) TABS tablet Take 1 tablet by mouth daily.   Probiotic Product (PROBIOTIC DAILY PO) Take 1 capsule by mouth daily.   simvastatin (ZOCOR) 5 MG tablet Take 1 tablet (5 mg total) by mouth daily.   traMADol (ULTRAM) 50 MG tablet TAKE ONE TABLET BY MOUTH TWICE A DAY AS NEEDED   No facility-administered encounter medications on file as of 06/19/2022.  Fill History: ALPRAZOLAM  0.5 MG TABS 04/27/2022 15   CYCLOBENZAPRINE HYDROCHLORIDE  10 MG TABS 02/26/2021 10   DULOXETINE HCL  30 MG CPEP 03/28/2022 90   LEVOTHYROXINE SODIUM  75 MCG TABS 05/09/2022 90   LISINOPRIL  10 MG TABS 03/26/2022 90   SIMVASTATIN  5 MG TABS 04/23/2022 90   TRAMADOL HYDROCHLORIDE  50 MG TABS 05/23/2022 15   Have you seen any other providers since your last visit? ** Any changes in your medications or health?  Any side effects from any medications?  Do you have an symptoms or problems not managed by your medications?  Any concerns about your health right now?  Has your provider asked that you check blood pressure, blood sugar, or follow special diet at home?  Do you get any type of exercise on a regular basis?  Can you think of a goal you would like to reach for your health?  Do you have any problems getting your medications?  Is there anything that you would like to discuss during the appointment?    06/23/2022 APPOINTMENT REMINDER Called Tillie Rung, No answer, left message of appointment  on 06/23/2022 at 3:00 via office visit with Gaylord Shih, Pharm D. Notified to have all medications, supplements, blood pressure and/or blood sugar logs available during appointment and to return call if need to reschedule.   Care Gaps: AWV - scheduled 02/09/2023 Last BP - 120/80 on 04/22/2022 Last A1C - 6.0 on 04/22/2022 Covid booster - overdue Flu - due Colonoscopy - postponed  Star Rating Drugs: Simvastatin 5 mg  - last filled 04/23/2022 90 DS at  [facility] Lisinopril 10 mg - last filled 03/26/2022 90 DS at [facility] CMA  Clinical Pharmacist Assistant [phone]

## 2022-06-23 ENCOUNTER — Ambulatory Visit (INDEPENDENT_AMBULATORY_CARE_PROVIDER_SITE_OTHER): Payer: Medicare Other | Admitting: Pharmacist

## 2022-06-23 ENCOUNTER — Other Ambulatory Visit: Payer: Self-pay | Admitting: Adult Health

## 2022-06-23 DIAGNOSIS — E039 Hypothyroidism, unspecified: Secondary | ICD-10-CM

## 2022-06-23 DIAGNOSIS — Z1231 Encounter for screening mammogram for malignant neoplasm of breast: Secondary | ICD-10-CM

## 2022-06-23 DIAGNOSIS — I10 Essential (primary) hypertension: Secondary | ICD-10-CM

## 2022-06-23 NOTE — Patient Instructions (Addendum)
Hi Infant,  It was great to get to meet you in person! Below is a summary of some of the topics we discussed.   Here are some highlights: -Work on getting more calcium in your diet (eating 1-2 servings of dairy per day, lots of nuts, beans and green vegetables) -Try to check your blood pressure once a week to keep an eye out on it and think about getting an arm cuff  Please reach out to me if you have any questions or need anything before our follow up!  Best, Maddie  Gaylord Shih, PharmD, [facility] Clinical Pharmacist [facility] [place] [phone]   Visit Information   Goals Addressed   None    Patient Care Plan: CCM Pharmacy Care Plan     Problem Identified: Problem: Hypertension, Hyperlipidemia, Hypothyroidism, Anxiety, and Osteopenia      Long-Range Goal: Patient-Specific Goal   Start Date: 06/23/2022  Expected End Date: 06/24/2023  This Visit's Progress: On track  Priority: High  Note:   Current Barriers:  Unable to independently monitor therapeutic efficacy Unable to achieve control of cholesterol  Suboptimal therapeutic regimen for hypothyroidism  Pharmacist Clinical Goal(s):  Patient will achieve adherence to monitoring guidelines and medication adherence to achieve therapeutic efficacy achieve control of cholesterol as evidenced by next lipid panel achieve improvement in TSH level as evidenced by next TSH check  through collaboration with PharmD and provider.   Interventions: 1:1 collaboration with [name], NP regarding development and update of comprehensive plan of care as evidenced by provider attestation and co-signature Inter-disciplinary care team collaboration (see longitudinal plan of care) Comprehensive medication review performed; medication list updated in electronic medical record  Hypertension (BP goal <130/80) -Not ideally controlled -Current treatment: Lisinopril 10 mg 1 tablet daily - Appropriate, Query effective, Safe,  Accessible  -Medications previously tried: higher dose of lisinopril was too much for her  -Current home readings: not checking often; uses an arm wrist cuff -Current dietary habits: doesn't use salt at all; does cooking at home; no frozen food - low sodium canned vegetables -Current exercise habits: walking in the stores but no routine exercise -Denies hypotensive/hypertensive symptoms -Educated on BP goals and benefits of medications for prevention of heart attack, stroke and kidney damage; Importance of home blood pressure monitoring; Proper BP monitoring technique; Symptoms of hypotension and importance of maintaining adequate hydration; -Counseled to monitor BP at home weekly, document, and provide log at future appointments -Counseled on diet and exercise extensively Recommended to continue current medication  Hyperlipidemia: (LDL goal < 100) -Uncontrolled -Current treatment: Simvastatin 5 mg 1 tablet daily - Appropriate, Query effective, Safe, Accessible -Medications previously tried: none  -Current dietary patterns: sometimes fried foods but not often; grand daughter is a good cook and more healthy; uses olive oil -Current exercise habits: walking in stores some but limited due to back and leg pain -Educated on Cholesterol goals;  Benefits of statin for ASCVD risk reduction; Importance of limiting foods high in cholesterol; -Counseled on diet and exercise extensively Recommended to continue current medication Recommended repeat lipid panel.  Pre-diabetes (A1c goal <6.5%) -Controlled -Current medications: Ozempic 0.25 mg inject once weekly  - Appropriate, Query effective, Safe, Accessible -Medications previously tried: none  -Current home glucose readings fasting glucose: does not need to check post prandial glucose: does not need to check -Denies hypoglycemic/hyperglycemic symptoms -Current meal patterns:  breakfast: did not discuss  lunch: did not discuss   dinner:  did not discuss  snacks: did not discuss  drinks: did not discuss  -Current exercise: unable to exercise much with back -Educated on Benefits of weight loss; Carbohydrate counting and/or plate method -Counseled to check feet daily and get yearly eye exams -Counseled on diet and exercise extensively Recommended to continue current medication  Depression/Anxiety (Goal: minimize symptoms) -Controlled -Current treatment: Alprazolam 0.5 mg 1 tablet at bedtime as needed (not even once a week) - Appropriate, Effective, Safe, Accessible Duloxetine 30 mg 1 capsule at bedtime - Appropriate, Effective, Safe, Accessible -Medications previously tried/failed: high dose duloxetine (more depressed), nausea with other SSRIs -PHQ9: 1 (04/22/22) -GAD7: n/a -Educated on Benefits of medication for symptom control Benefits of cognitive-behavioral therapy with or without medication -Recommended to continue current medication  Osteopenia (Goal prevent fractures) -Not ideally controlled -Last DEXA Scan: unknown   T-Score femoral neck: n/a  T-Score total hip: n/a  T-Score lumbar spine: n/a  T-Score forearm radius: n/a  10-year probability of major osteoporotic fracture: n/a  10-year probability of hip fracture: n/a -Patient is not a candidate for pharmacologic treatment -Current treatment  Multivitamin 1 capsule daily (100 mg of calcium, 2000 units of vitamin D) - Appropriate, Effective, Safe, Accessible -Medications previously tried: none  -Recommend [phone] units of vitamin D daily. Recommend 1200 mg of calcium daily from dietary and supplemental sources. Recommend weight-bearing and muscle strengthening exercises for building and maintaining bone density. -Counseled on diet and exercise extensively Recommended increasing dietary intake of calcium and patient will aim for at least 1 serving of dairy per day and some through nuts and vegetables  Hypothyroidism (Goal: 2.5-4.5) -Not ideally controlled  (given osteopenia) -Current treatment  Levothyroxine 75 mcg  1 tablet daily before breakfast - Appropriate, Effective, Query Safe, Accessible -Medications previously tried: Hospital doctor thyroid -Counseled on impact of lower TSH on bone thinning. Recommended adjustment of levothyroxine dose depending on bone density results.  Pain/muscle spasms (Goal: minimize pain) -Controlled -Current treatment  Acetaminophen 650 mg as needed  - Appropriate, Effective, Safe, Accessible Tramadol 50 mg 1 tablet twice daily as needed (usually once a day) - Appropriate, Effective, Safe, Accessible Cyclobenzaprine 10 mg 1 tablet as needed - Appropriate, Effective, Safe, Accessible Tizanidine 2 mg 1 tablet as needed - Appropriate, Effective, Safe, Accessible -Medications previously tried: meloxicam  -Counseled on limiting use of NSAIDs due to affects on kidneys and recommended up to 4 per day of arthritis strength Tylenol.  GERD (Goal: minimize symptoms) -Controlled -Current treatment  Lansoprazole 15 mg 1 capsule as needed (cannot recall the last time she used it) - Appropriate, Effective, Safe, Accessible Tums as needed (1-2 per week) - Appropriate, Effective, Safe, Accessible -Medications previously tried: omeprazole (ineffective)  -Recommended non-pharmacologic management of symptoms such as elevating the head of your bed, avoiding eating 2-3 hours before bed, avoiding triggering foods such as acidic, spicy, or fatty foods, eating smaller meals, and wearing clothes that are loose around the waist Collaborated with PCP to send in a prescription to see if it's covered by insurance.  Health Maintenance -Vaccine gaps: COVID booster, influenza vaccine -Current therapy:  Probiotic daily -Educated on Cost vs benefit of each product must be carefully weighed by individual consumer -Patient is satisfied with current therapy and denies issues -Recommended to continue current medication  Patient Goals/Self-Care  Activities Patient will:  - take medications as prescribed as evidenced by patient report and record review check blood pressure weekly, document, and provide at future appointments  Follow Up Plan: The care management team will reach out to the patient again over the  next 30 days.       Ms. Sarkis was given information about Chronic Care Management services today including:  CCM service includes personalized support from designated clinical staff supervised by her physician, including individualized plan of care and coordination with other care providers 24/7 contact phone numbers for assistance for urgent and routine care needs. Standard insurance, coinsurance, copays and deductibles apply for chronic care management only during months in which we provide at least 20 minutes of these services. Most insurances cover these services at 100%, however patients may be responsible for any copay, coinsurance and/or deductible if applicable. This service may help you avoid the need for more expensive face-to-face services. Only one practitioner may furnish and bill the service in a calendar month. The patient may stop CCM services at any time (effective at the end of the month) by phone call to the office staff.  Patient agreed to services and verbal consent obtained.   Patient verbalizes understanding of instructions and care plan provided today and agrees to view in MyChart. Active MyChart status and patient understanding of how to access instructions and care plan via MyChart confirmed with patient.    The pharmacy team will reach out to the patient again over the next 30 days.   Verner Chol, [facility]

## 2022-06-23 NOTE — Progress Notes (Signed)
Chronic Care Management Pharmacy Note  06/23/2022 Name:  [name] MRN:  [id number] DOB:  10-01-1946  Summary: BP is mostly at goal < 130/80 LDL is not at goal < 100 Pt is overdue for DEXA  Recommendations/Changes made from today's visit: -Recommended routine BP monitoring at home -Recommend repeat lipid panel at next office visit -Recommended repeat DEXA -Consider adjustment of levothyroxine dose given TSH not optimal for osteopenia  Plan: Follow up after discussion with PCP Schedule DEXA  Subjective: [name] is an 76 y.o. year old female who is a primary patient of Dorothyann Peng, NP.  The CCM team was consulted for assistance with disease management and care coordination needs.    Engaged with patient face to face for initial visit in response to provider referral for pharmacy case management and/or care coordination services.   Consent to Services:  The patient was given the following information about Chronic Care Management services today, agreed to services, and gave verbal consent: 1. CCM service includes personalized support from designated clinical staff supervised by the primary care provider, including individualized plan of care and coordination with other care providers 2. 24/7 contact phone numbers for assistance for urgent and routine care needs. 3. Service will only be billed when office clinical staff spend 20 minutes or more in a month to coordinate care. 4. Only one practitioner may furnish and bill the service in a calendar month. 5.The patient may stop CCM services at any time (effective at the end of the month) by phone call to the office staff. 6. The patient will be responsible for cost sharing (co-pay) of up to 20% of the service fee (after annual deductible is met). Patient agreed to services and consent obtained.  Patient Care Team: Dorothyann Peng, NP as PCP - General (Family Medicine) Viona Gilmore, [facility] as Pharmacist (Pharmacist)  Recent  office visits: 04/22/22 Dorothyann Peng, NP - Patient presented for Routine general medical exam at a health care facility and other concerns. No medication changes. Prescribed simvastatin after labwork.   02/13/22 Kellie Simmering, LPN - Patient presented via telephone for Medicare Annual Wellness exam. No medication changes.   12/24/21 Dorothyann Peng, NP - Patient presented for Acquired hypothyroidism and other concerns. Discontinued Biotin and Oxycodone.  Recent consult visits: 11/17/21 Felipa Furnace, DPM (Podiatry) - Patient presented for Pain due to onychomycosis of toenails of both feet. No medication changes.  Hospital visits: None in previous 6 months   Objective:  Lab Results  Component Value Date   CREATININE 0.96 04/22/2022   BUN 18 04/22/2022   GFR 58.46 (L) 04/22/2022   GFRNONAA >60 03/03/2021   GFRAA  03/20/2008    >60        The eGFR has been calculated using the MDRD equation. This calculation has not been validated in all clinical   NA 137 04/22/2022   K 5.1 04/22/2022   CALCIUM 10.0 04/22/2022   CO2 28 04/22/2022   GLUCOSE 95 04/22/2022    Lab Results  Component Value Date/Time   HGBA1C 6.0 04/22/2022 11:44 AM   GFR 58.46 (L) 04/22/2022 11:44 AM    Last diabetic Eye exam: No results found for: "HMDIABEYEEXA"  Last diabetic Foot exam: No results found for: "HMDIABFOOTEX"   Lab Results  Component Value Date   CHOL 202 (H) 04/22/2022   HDL 61.60 04/22/2022   LDLCALC 118 (H) 04/22/2022   TRIG 108.0 04/22/2022   CHOLHDL 3 04/22/2022       Latest  Ref Rng & Units 04/22/2022   11:44 AM 03/03/2021    5:57 PM 03/20/2008   12:01 PM  Hepatic Function  Total Protein 6.0 - 8.3 g/dL 7.7  7.5  7.7   Albumin 3.5 - 5.2 g/dL 4.1  3.6  4.1   AST 0 - 37 U/L 25  26  29    ALT 0 - 35 U/L 18  25  25    Alk Phosphatase 39 - 117 U/L 87  85  85   Total Bilirubin 0.2 - 1.2 mg/dL 0.5  0.8  0.6     Lab Results  Component Value Date/Time   TSH 0.76 04/22/2022 11:44 AM        Latest Ref Rng & Units 04/22/2022   11:44 AM 03/04/2021    7:54 AM 03/03/2021    5:57 PM  CBC  WBC 4.0 - 10.5 K/uL 8.1  6.5  11.8   Hemoglobin 12.0 - 15.0 g/dL 14.3  12.3  14.2   Hematocrit 36.0 - 46.0 % 42.9  37.4  42.9   Platelets 150.0 - 400.0 K/uL 313.0  238  351     No results found for: "VD25OH"  Clinical ASCVD: No  The 10-year ASCVD risk score (Arnett DK, et al., 2019) is: 16.9%   Values used to calculate the score:     Age: 10 years     Sex: Female     Is Non-Hispanic African American: No     Diabetic: No     Tobacco smoker: No     Systolic Blood Pressure: 465 mmHg     Is BP treated: Yes     HDL Cholesterol: 61.6 mg/dL     Total Cholesterol: 202 mg/dL       04/22/2022   11:15 AM 02/03/2022    9:54 AM  Depression screen PHQ 2/9  Decreased Interest 0 0  Down, Depressed, Hopeless 0 0  PHQ - 2 Score 0 0  Altered sleeping 0   Tired, decreased energy 1   Change in appetite 0   Feeling bad or failure about yourself  0   Trouble concentrating 0   Moving slowly or fidgety/restless 0   Suicidal thoughts 0   PHQ-9 Score 1   Difficult doing work/chores Not difficult at all       Social History   Tobacco Use  Smoking Status Never  Smokeless Tobacco Never   BP Readings from Last 3 Encounters:  04/22/22 120/80  12/24/21 110/78  05/25/21 (!) 143/96   Pulse Readings from Last 3 Encounters:  04/22/22 98  12/24/21 (!) 104  05/25/21 100   Wt Readings from Last 3 Encounters:  04/22/22 177 lb (80.3 kg)  02/03/22 174 lb (78.9 kg)  12/24/21 177 lb (80.3 kg)   BMI Readings from Last 3 Encounters:  04/22/22 34.57 kg/m  02/03/22 33.98 kg/m  12/24/21 35.15 kg/m    Assessment/Interventions: Review of patient past medical history, allergies, medications, health status, including review of consultants reports, laboratory and other test data, was performed as part of comprehensive evaluation and provision of chronic care management services.   SDOH:  (Social  Determinants of Health) assessments and interventions performed: Yes SDOH Interventions    Flowsheet Row Most Recent Value  SDOH Interventions   Financial Strain Interventions Intervention Not Indicated  Transportation Interventions Intervention Not Indicated      SDOH Screenings   Alcohol Screen: Not on file  Depression (PHQ2-9): Low Risk  (04/22/2022)   Depression (PHQ2-9)  PHQ-2 Score: 1  Financial Resource Strain: Low Risk  (06/23/2022)   Overall Financial Resource Strain (CARDIA)    Difficulty of Paying Living Expenses: Not hard at all  Food Insecurity: No Food Insecurity (02/03/2022)   Hunger Vital Sign    Worried About Running Out of Food in the Last Year: Never true    Ran Out of Food in the Last Year: Never true  Housing: Not on file  Physical Activity: Inactive (02/03/2022)   Exercise Vital Sign    Days of Exercise per Week: 0 days    Minutes of Exercise per Session: 0 min  Social Connections: Not on file  Stress: No Stress Concern Present (02/03/2022)   [place]    Feeling of Stress : Not at all  Tobacco Use: Low Risk  (04/22/2022)   Patient History    Smoking Tobacco Use: Never    Smokeless Tobacco Use: Never    Passive Exposure: Not on file  Transportation Needs: No Transportation Needs (06/23/2022)   PRAPARE - Transportation    Lack of Transportation (Medical): No    Lack of Transportation (Non-Medical): No   Patient presented with her sister who she cared for when growing up. They are 14 years apart and have a lot of the same health problems, such as hypoythroidism. She is a big supporter for her and they are close.  Patient's biggest concern with her health is her back and leg pain she experiences often. She last had surgery 1 year ago for an extension of her rods and caging. She previously had a 3 day surgery 4 years ago and that's when the initial rods and caging were installed in her back.  She  used to take about 6 tramadol a day but now she only takes 1 and sometimes 2. She only follows up with that doctor (Dr. Ronnald Ramp) as needed but will eventually see him soon for neck problems.  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 It has taken a big impact on her life and health. She goes through bigger period of depression, which is when her duloxetine was increased, when he was first diagnosed but is doing better now. Her husband doesn't always listen to her and is very hyper so this makes it more difficult to manage him. She also cares for her 2 grandsons sometimes (53 and 50 years old) and they keep her busy as well.  Patient doesn't always sleep well because of her husband. She does remain tired during the day often but doesn't nap often because she can't leave him unattended and he doesn't sleep through the night and wakes her up. Her 43 year old grandsom helps watch him some and their grand daughter lives with them and she isnt working right now so she is helpful.  CCM Care Plan  Allergies  Allergen Reactions   Celecoxib     Other reaction(s): itching   Citalopram Hydrobromide     Other reaction(s): nausea   Codeine Nausea Only and Other (See Comments)    Severe headache   Latex Itching    Other reaction(s): itching----GLOVES   Meperidine Hcl     Other reaction(s): itching   Morphine And Related Nausea And Vomiting    And headaches   Tape Itching   Trazodone Hcl     Other reaction(s): nightmares    Medications Reviewed Today  Reviewed by Viona Gilmore, [place] (Pharmacist) on 06/23/22 at 1508  Med List Status: <None>   Medication Order Taking? Sig Documenting Provider Last Dose Status Informant  acetaminophen (TYLENOL) 650 MG CR tablet [id number] Yes Take 650 mg by mouth every 8 (eight) hours as needed for pain. [provider] Taking Active Self  ALPRAZolam (XANAX) 0.5 MG tablet [id number]  Take 0.25 mg by  mouth at bedtime as needed for sleep. [provider]  Active Self  Discontinued 06/23/22 1508 (Completed Course) Bacillus Coagulans-Inulin (PROBIOTIC) 1-250 BILLION-MG CAPS [id number]  1 capsule [provider]  Active   cyclobenzaprine (FLEXERIL) 10 MG tablet [id number]  Take 1 tablet (10 mg total) by mouth 3 (three) times daily as needed for muscle spasms. Meyran, Ocie Cornfield, NP  Active Self  DULoxetine (CYMBALTA) 30 MG capsule [id number]  Take 1 capsule (30 mg total) by mouth at bedtime. Nafziger, Tommi Rumps, NP  Active   levothyroxine (SYNTHROID) 75 MCG tablet [id number]  Take 75 mcg by mouth daily before breakfast. [provider]  Active Self  lisinopril (ZESTRIL) 20 MG tablet [id number]  Take 10 mg by mouth at bedtime. [provider]  Active Self  Multiple Vitamin (MULTIVITAMIN WITH MINERALS) TABS tablet [id number]  Take 1 tablet by mouth daily. [provider]  Active Self  Probiotic Product (PROBIOTIC DAILY PO) [id number]  Take 1 capsule by mouth daily. [provider]  Active Self  Semaglutide,0.25 or 0.5MG/DOS, (OZEMPIC, 0.25 OR 0.5 MG/DOSE,) 2 MG/1.5ML SOPN [id number] Yes Inject 1 mg into the skin once a week. [provider]  Active   simvastatin (ZOCOR) 5 MG tablet [id number]  Take 1 tablet (5 mg total) by mouth daily. Nafziger, Tommi Rumps, NP  Active   traMADol (ULTRAM) 50 MG tablet [id number]  TAKE ONE TABLET BY MOUTH TWICE A DAY AS NEEDED Dorothyann Peng, NP  Active             Patient Active Problem List   Diagnosis Date Noted   Hypothyroidism 12/24/2021   Hypertension 12/24/2021   Depression 12/24/2021   Fever 03/04/2021   Fever and chills 03/04/2021   S/P lumbar fusion 02/25/2021    Immunization History  Administered Date(s) Administered   Influenza Split 08/05/2010, 08/03/2012, 09/18/2013, 08/15/2014, 08/27/2016, 07/24/2017, 08/01/2019, 09/17/2021   Influenza, High Dose Seasonal PF 08/07/2015, 07/26/2018, 07/27/2019    PFIZER(Purple Top)SARS-COV-2 Vaccination 12/29/2019, 01/23/2020, 08/20/2020   Pneumococcal Conjugate-13 04/07/2014   Pneumococcal Polysaccharide-23 04/05/2013   Tdap 04/03/2008, 02/02/2017   Zoster Recombinat (Shingrix) 04/22/2017, 07/24/2017   Zoster, Live 04/03/2008, 04/22/2017, 07/24/2017    Conditions to be addressed/monitored:  Hypertension, Hyperlipidemia, Hypothyroidism, Anxiety, and Osteopenia  Care Plan : Reading  Updates made by Viona Gilmore, [place] since 06/23/2022 12:00 AM     Problem: Problem: Hypertension, Hyperlipidemia, Hypothyroidism, Anxiety, and Osteopenia      Long-Range Goal: Patient-Specific Goal   Start Date: 06/23/2022  Expected End Date: 06/24/2023  This Visit's Progress: On track  Priority: High  Note:   Current Barriers:  Unable to independently monitor therapeutic efficacy Unable to achieve control of cholesterol  Suboptimal therapeutic regimen for hypothyroidism  Pharmacist Clinical Goal(s):  Patient will achieve adherence to monitoring guidelines and medication adherence to achieve therapeutic efficacy achieve control of cholesterol as evidenced by next lipid panel achieve improvement in TSH level as evidenced by next TSH check  through collaboration with PharmD and provider.   Interventions: 1:1 collaboration with Dorothyann Peng, NP regarding development and update of comprehensive plan  of care as evidenced by provider attestation and co-signature Inter-disciplinary care team collaboration (see longitudinal plan of care) Comprehensive medication review performed; medication list updated in electronic medical record  Hypertension (BP goal <130/80) -Not ideally controlled -Current treatment: Lisinopril 10 mg 1 tablet daily - Appropriate, Query effective, Safe, Accessible  -Medications previously tried: higher dose of lisinopril was too much for her  -Current home readings: not checking often; uses an arm wrist cuff -Current dietary  habits: doesn't use salt at all; does cooking at home; no frozen food - low sodium canned vegetables -Current exercise habits: walking in the stores but no routine exercise -Denies hypotensive/hypertensive symptoms -Educated on BP goals and benefits of medications for prevention of heart attack, stroke and kidney damage; Importance of home blood pressure monitoring; Proper BP monitoring technique; Symptoms of hypotension and importance of maintaining adequate hydration; -Counseled to monitor BP at home weekly, document, and provide log at future appointments -Counseled on diet and exercise extensively Recommended to continue current medication  Hyperlipidemia: (LDL goal < 100) -Uncontrolled -Current treatment: Simvastatin 5 mg 1 tablet daily - Appropriate, Query effective, Safe, Accessible -Medications previously tried: none  -Current dietary patterns: sometimes fried foods but not often; grand daughter is a good cook and more healthy; uses olive oil -Current exercise habits: walking in stores some but limited due to back and leg pain -Educated on Cholesterol goals;  Benefits of statin for ASCVD risk reduction; Importance of limiting foods high in cholesterol; -Counseled on diet and exercise extensively Recommended to continue current medication Recommended repeat lipid panel.  Pre-diabetes (A1c goal <6.5%) -Controlled -Current medications: Ozempic 0.25 mg inject once weekly  - Appropriate, Query effective, Safe, Accessible -Medications previously tried: none  -Current home glucose readings fasting glucose: does not need to check post prandial glucose: does not need to check -Denies hypoglycemic/hyperglycemic symptoms -Current meal patterns:  breakfast: did not discuss  lunch: did not discuss   dinner: did not discuss  snacks: did not discuss  drinks: did not discuss  -Current exercise: unable to exercise much with back -Educated on Benefits of weight loss; Carbohydrate  counting and/or plate method -Counseled to check feet daily and get yearly eye exams -Counseled on diet and exercise extensively Recommended to continue current medication  Depression/Anxiety (Goal: minimize symptoms) -Controlled -Current treatment: Alprazolam 0.5 mg 1 tablet at bedtime as needed (not even once a week) - Appropriate, Effective, Safe, Accessible Duloxetine 30 mg 1 capsule at bedtime - Appropriate, Effective, Safe, Accessible -Medications previously tried/failed: high dose duloxetine (more depressed), nausea with other SSRIs -PHQ9: 1 (04/22/22) -GAD7: n/a -Educated on Benefits of medication for symptom control Benefits of cognitive-behavioral therapy with or without medication -Recommended to continue current medication  Osteopenia (Goal prevent fractures) -Not ideally controlled -Last DEXA Scan: unknown   T-Score femoral neck: n/a  T-Score total hip: n/a  T-Score lumbar spine: n/a  T-Score forearm radius: n/a  10-year probability of major osteoporotic fracture: n/a  10-year probability of hip fracture: n/a -Patient is not a candidate for pharmacologic treatment -Current treatment  Multivitamin 1 capsule daily (100 mg of calcium, 2000 units of vitamin D) - Appropriate, Effective, Safe, Accessible -Medications previously tried: none  -Recommend [phone] units of vitamin D daily. Recommend 1200 mg of calcium daily from dietary and supplemental sources. Recommend weight-bearing and muscle strengthening exercises for building and maintaining bone density. -Counseled on diet and exercise extensively Recommended increasing dietary intake of calcium and patient will aim for at least 1 serving of dairy per  day and some through nuts and vegetables  Hypothyroidism (Goal: 2.5-4.5) -Not ideally controlled (given osteopenia) -Current treatment  Levothyroxine 75 mcg  1 tablet daily before breakfast - Appropriate, Effective, Query Safe, Accessible -Medications previously tried:  Paramedic thyroid -Counseled on impact of lower TSH on bone thinning. Recommended adjustment of levothyroxine dose depending on bone density results.  Pain/muscle spasms (Goal: minimize pain) -Controlled -Current treatment  Acetaminophen 650 mg as needed  - Appropriate, Effective, Safe, Accessible Tramadol 50 mg 1 tablet twice daily as needed (usually once a day) - Appropriate, Effective, Safe, Accessible Cyclobenzaprine 10 mg 1 tablet as needed - Appropriate, Effective, Safe, Accessible Tizanidine 2 mg 1 tablet as needed - Appropriate, Effective, Safe, Accessible -Medications previously tried: meloxicam  -Counseled on limiting use of NSAIDs due to affects on kidneys and recommended up to 4 per day of arthritis strength Tylenol.  GERD (Goal: minimize symptoms) -Controlled -Current treatment  Lansoprazole 15 mg 1 capsule as needed (cannot recall the last time she used it) - Appropriate, Effective, Safe, Accessible Tums as needed (1-2 per week) - Appropriate, Effective, Safe, Accessible -Medications previously tried: omeprazole (ineffective)  -Recommended non-pharmacologic management of symptoms such as elevating the head of your bed, avoiding eating 2-3 hours before bed, avoiding triggering foods such as acidic, spicy, or fatty foods, eating smaller meals, and wearing clothes that are loose around the waist Collaborated with PCP to send in a prescription to see if it's covered by insurance.  Health Maintenance -Vaccine gaps: COVID booster, influenza vaccine -Current therapy:  Probiotic daily -Educated on Cost vs benefit of each product must be carefully weighed by individual consumer -Patient is satisfied with current therapy and denies issues -Recommended to continue current medication  Patient Goals/Self-Care Activities Patient will:  - take medications as prescribed as evidenced by patient report and record review check blood pressure weekly, document, and provide at future  appointments  Follow Up Plan: The care management team will reach out to the patient again over the next 30 days.       Medication Assistance: None required.  Patient affirms current coverage meets needs. Patient has medicare extra help.  Compliance/Adherence/Medication fill history: Care Gaps: COVID booster, colonoscopy, influenza vaccine Last BP - 120/80 on 04/22/2022 Last A1C - 6.0 on 04/22/2022  Star-Rating Drugs: Simvastatin 5 mg  - last filled 04/23/2022 90 DS at [facility] Lisinopril 10 mg - last filled 03/26/2022 90 [facility] at [facility]  Patient's preferred pharmacy is:  [id number] - [address], [place] [address] Phone: [phone] Fax: [phone]  Uses pill box? Yes - morning and night Pt endorses 100% compliance  We discussed: Benefits of medication synchronization, packaging and delivery as well as enhanced pharmacist oversight with [employer]. Patient decided to: Continue current medication management strategy  Care Plan and Follow Up Patient Decision:  Patient agrees to Care Plan and Follow-up.  Plan: The care management team will reach out to the patient again over the next 30 days.  Jeni Salles, PharmD, [place] Pharmacist [place] [facility]

## 2022-06-25 ENCOUNTER — Other Ambulatory Visit: Payer: Self-pay | Admitting: Adult Health

## 2022-06-26 ENCOUNTER — Other Ambulatory Visit: Payer: Self-pay | Admitting: Adult Health

## 2022-06-26 ENCOUNTER — Encounter: Payer: Self-pay | Admitting: Adult Health

## 2022-06-26 DIAGNOSIS — M8580 Other specified disorders of bone density and structure, unspecified site: Secondary | ICD-10-CM | POA: Insufficient documentation

## 2022-06-26 DIAGNOSIS — E785 Hyperlipidemia, unspecified: Secondary | ICD-10-CM | POA: Insufficient documentation

## 2022-06-26 DIAGNOSIS — R7303 Prediabetes: Secondary | ICD-10-CM | POA: Insufficient documentation

## 2022-06-26 MED ORDER — LANSOPRAZOLE 15 MG PO CPDR: 15.0000 mg | DELAYED_RELEASE_CAPSULE | Freq: Every day | ORAL | 0 refills | Status: DC

## 2022-06-30 NOTE — Telephone Encounter (Signed)
Last refill 03/26/22 Last office visit 04/22/22

## 2022-07-02 ENCOUNTER — Ambulatory Visit: Payer: Medicare Other

## 2022-07-07 NOTE — Chronic Care Management (AMB) (Signed)
Pt completed pharmacy f/u appt on 06/23/2022

## 2022-07-11 ENCOUNTER — Telehealth: Payer: Self-pay | Admitting: Pharmacist

## 2022-07-11 NOTE — Telephone Encounter (Signed)
Called patient to schedule the bone density scan. Will send her a mychart message as well with the appt information per her request.  Patient also inquired about other weight loss medications. Unfortunately with medicare, no weight loss medications will be covered except for Ozempic because of her pre-diabetes. Patient verbalized her understanding.

## 2022-07-17 ENCOUNTER — Inpatient Hospital Stay: Admission: RE | Admit: 2022-07-17 | Payer: Medicare Other | Source: Ambulatory Visit

## 2022-07-17 ENCOUNTER — Ambulatory Visit
Admission: RE | Admit: 2022-07-17 | Discharge: 2022-07-17 | Disposition: A | Discharge status: Home / Self Care | Payer: Medicare Other | Source: Ambulatory Visit | Attending: Adult Health | Admitting: Adult Health

## 2022-07-17 DIAGNOSIS — I10 Essential (primary) hypertension: Secondary | ICD-10-CM | POA: Diagnosis not present

## 2022-07-17 DIAGNOSIS — M8580 Other specified disorders of bone density and structure, unspecified site: Secondary | ICD-10-CM | POA: Diagnosis not present

## 2022-07-17 DIAGNOSIS — F32A Depression, unspecified: Secondary | ICD-10-CM | POA: Diagnosis not present

## 2022-07-17 DIAGNOSIS — E785 Hyperlipidemia, unspecified: Secondary | ICD-10-CM

## 2022-07-17 DIAGNOSIS — Z1231 Encounter for screening mammogram for malignant neoplasm of breast: Secondary | ICD-10-CM

## 2022-07-17 DIAGNOSIS — E039 Hypothyroidism, unspecified: Secondary | ICD-10-CM

## 2022-07-23 ENCOUNTER — Ambulatory Visit (INDEPENDENT_AMBULATORY_CARE_PROVIDER_SITE_OTHER)
Admission: RE | Admit: 2022-07-23 | Discharge: 2022-07-23 | Disposition: A | Discharge status: Home / Self Care | Payer: Medicare Other | Source: Ambulatory Visit | Attending: Adult Health | Admitting: Adult Health

## 2022-07-23 DIAGNOSIS — M8580 Other specified disorders of bone density and structure, unspecified site: Secondary | ICD-10-CM | POA: Diagnosis not present

## 2022-08-13 ENCOUNTER — Other Ambulatory Visit: Payer: Self-pay | Admitting: Adult Health

## 2022-08-24 ENCOUNTER — Other Ambulatory Visit: Payer: Self-pay | Admitting: Adult Health

## 2022-08-26 MED ORDER — ALPRAZOLAM 0.5 MG PO TABS: 0.2500 mg | ORAL_TABLET | Freq: Every evening | ORAL | 2 refills | Status: DC | PRN

## 2022-08-28 ENCOUNTER — Encounter: Payer: Self-pay | Admitting: Adult Health

## 2022-08-28 ENCOUNTER — Telehealth (INDEPENDENT_AMBULATORY_CARE_PROVIDER_SITE_OTHER): Payer: Medicare Other | Admitting: Adult Health

## 2022-08-28 VITALS — HR 102 | Temp 98.2°F | Ht 60.0 in | Wt 170.0 lb

## 2022-08-28 DIAGNOSIS — F32A Depression, unspecified: Secondary | ICD-10-CM

## 2022-08-28 DIAGNOSIS — G8929 Other chronic pain: Secondary | ICD-10-CM

## 2022-08-28 DIAGNOSIS — M549 Dorsalgia, unspecified: Secondary | ICD-10-CM

## 2022-08-28 NOTE — Progress Notes (Signed)
Virtual Visit via Video Note  I connected with [name]  on 08/28/22 at  2:30 PM EDT by a video enabled telemedicine application and verified that I am speaking with the correct person using two identifiers.  Location patient: home Location provider:work or home office Persons participating in the virtual visit: patient, provider  I discussed the limitations of evaluation and management by telemedicine and the availability of in person appointments. The patient expressed understanding and agreed to proceed.   HPI: 76 year old female who  has a past medical history of Anxiety, Arthritis, Chronic back pain, Chronic kidney disease (2019), Depression, GERD (gastroesophageal reflux disease), Hyperlipidemia, Hypertension, Hypothyroidism, Macular degeneration of both eyes, Osteopenia, PONV (postoperative nausea and vomiting), and Prediabetes.  She is being evaluated today for chronic issues.  She reports worsening neuropathic pain and worsening depression.  She does know if the worsening pain is causing worsening depression or if her depression is causing worsening pain.  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  She has been able to get rid of to outdoor shed's worth of stuff.  She feels as though the extra work may be causing worsening pain.  He is currently prescribed Cymbalta 30 mg and tramadol 50 mg twice daily as needed.  At 1 point in time she was on Cymbalta 60 mg but her depression was well controlled so we cut it back to 30  She still has quite a few 60 mg tabs and is wondering if it would be okay to go back up to the 60 mg of Cymbalta daily  ROS: See pertinent positives and negatives per HPI.  Past Medical History:  Diagnosis Date   Anxiety    Arthritis    Chronic back pain    Chronic kidney disease 2019   ARF while in hospital for PLIF   Depression     GERD (gastroesophageal reflux disease)    Hyperlipidemia    Hypertension    Hypothyroidism    Macular degeneration of both eyes    not age related   Osteopenia    PONV (postoperative nausea and vomiting)    also is slow to wake up   Prediabetes     Past Surgical History:  Procedure Laterality Date   ABDOMINAL HYSTERECTOMY     total   ANTERIOR CERVICAL DISCECTOMY     APPENDECTOMY     BACK SURGERY     PLIF   BUNIONECTOMY Right    CHOLECYSTECTOMY     COLONOSCOPY     HAND SURGERY Left    thumb joint, carpal tunnel and cyst   HAND SURGERY Right    thumb and carpal tunnel   POSTERIOR CERVICAL FUSION/FORAMINOTOMY      Family History  Problem Relation Age of Onset   Breast cancer Neg Hx        Current Outpatient Medications:    acetaminophen (TYLENOL) 650 MG CR tablet, Take 650 mg by mouth every 8 (eight) hours as needed for pain., Disp: , Rfl:    ALPRAZolam (XANAX) 0.5 MG tablet, Take 0.5 tablets (0.25 mg total) by mouth at bedtime as needed for sleep., Disp: 30 tablet, Rfl: 2   Bacillus Coagulans-Inulin (PROBIOTIC) 1-250 BILLION-MG CAPS, , Disp: , Rfl:    cyclobenzaprine (FLEXERIL) 10 MG tablet, Take 1 tablet (10 mg total) by mouth 3 (three) times  daily as needed for muscle spasms., Disp: 30 tablet, Rfl: 0   DULoxetine (CYMBALTA) 30 MG capsule, Take 1 capsule (30 mg total) by mouth at bedtime., Disp: 90 capsule, Rfl: 1   lansoprazole (PREVACID) 15 MG capsule, Take 1 capsule (15 mg total) by mouth daily at 12 noon., Disp: 90 capsule, Rfl: 0   levothyroxine (SYNTHROID) 75 MCG tablet, Take 75 mcg by mouth daily before breakfast., Disp: , Rfl:    lisinopril (ZESTRIL) 20 MG tablet, Take 10 mg by mouth at bedtime., Disp: , Rfl:    Multiple Vitamin (MULTIVITAMIN WITH MINERALS) TABS tablet, Take 1 tablet by mouth daily., Disp: , Rfl:    Semaglutide,0.25 or 0.5MG /DOS, (OZEMPIC, 0.25 OR 0.5 MG/DOSE,) 2 MG/3ML SOPN, Inject 0.5 mg into the skin once a week., Disp: 3 mL, Rfl: 2    simvastatin (ZOCOR) 5 MG tablet, Take 1 tablet (5 mg total) by mouth daily., Disp: 90 tablet, Rfl: 3   tiZANidine (ZANAFLEX) 2 MG tablet, Take 2 mg by mouth every 6 (six) hours as needed for muscle spasms., Disp: , Rfl:    traMADol (ULTRAM) 50 MG tablet, TAKE ONE TABLET BY MOUTH TWICE A DAY AS NEEDED, Disp: 60 tablet, Rfl: 2  EXAM:  VITALS per patient if applicable:  GENERAL: alert, oriented, appears well and in no acute distress  HEENT: atraumatic, conjunttiva clear, no obvious abnormalities on inspection of external nose and ears  NECK: normal movements of the head and neck  LUNGS: on inspection no signs of respiratory distress, breathing rate appears normal, no obvious gross SOB, gasping or wheezing  CV: no obvious cyanosis  MS: moves all visible extremities without noticeable abnormality  PSYCH/NEURO: pleasant and cooperative, no obvious depression or anxiety, speech and thought processing grossly intact  ASSESSMENT AND PLAN:  Discussed the following assessment and plan:  1. Depression, unspecified depression type - I am okay with her increasing her Cymbalta back to 60 mg to help with both depression and chronic pain.  We will follow-up with me in 2 weeks to let me know how it is working  2. Chronic midline back pain, unspecified back location       I discussed the assessment and treatment plan with the patient. The patient was provided an opportunity to ask questions and all were answered. The patient agreed with the plan and demonstrated an understanding of the instructions.   The patient was advised to call back or seek an in-person evaluation if the symptoms worsen or if the condition fails to improve as anticipated.   Dorothyann Peng, NP

## 2022-09-26 ENCOUNTER — Other Ambulatory Visit: Payer: Self-pay | Admitting: Adult Health

## 2022-10-13 ENCOUNTER — Telehealth: Payer: Self-pay | Admitting: Adult Health

## 2022-10-13 NOTE — Telephone Encounter (Signed)
Pt was sent to triage nurse she had a fall on Friday or Saturday and may have concussion. Pt decline the triage advise to go to ER or UC. Pt states she can not leave her husband. Pt had made mychart appt that was cancel triage nurse told her she can not do virtual appt

## 2022-10-14 ENCOUNTER — Telehealth: Payer: Medicare Other | Admitting: Adult Health

## 2022-10-14 ENCOUNTER — Encounter: Payer: Self-pay | Admitting: Adult Health

## 2022-10-14 ENCOUNTER — Ambulatory Visit (INDEPENDENT_AMBULATORY_CARE_PROVIDER_SITE_OTHER): Payer: Medicare Other | Admitting: Adult Health

## 2022-10-14 VITALS — BP 130/86 | HR 105 | Temp 98.0°F | Ht 60.0 in | Wt 175.0 lb

## 2022-10-14 DIAGNOSIS — S0990XA Unspecified injury of head, initial encounter: Secondary | ICD-10-CM | POA: Diagnosis not present

## 2022-10-14 DIAGNOSIS — R110 Nausea: Secondary | ICD-10-CM | POA: Diagnosis not present

## 2022-10-14 MED ORDER — ONDANSETRON HCL 4 MG PO TABS: 4.0000 mg | ORAL_TABLET | Freq: Three times a day (TID) | ORAL | 2 refills | Status: DC | PRN

## 2022-10-14 NOTE — Telephone Encounter (Signed)
Pt was scheduled for in person appt.

## 2022-10-14 NOTE — Progress Notes (Signed)
Subjective:    Patient ID: [name], female    DOB: Jul 21, 1946, 76 y.o.   MRN: [id number]  HPI 76 year old female who  has a past medical history of Anxiety, Arthritis, Chronic back pain, Chronic kidney disease (2019), Depression, GERD (gastroesophageal reflux disease), Hyperlipidemia, Hypertension, Hypothyroidism, Macular degeneration of both eyes, Osteopenia, PONV (postoperative nausea and vomiting), and Prediabetes.  She presents to the office today for concern of head injury. She that four days ago she slipped on her floor, she hit her knees first and then hit the left side of her head.  Denis LOC, visual disturbances, headaches, or amnesia   Her biggest complaint is that of constant buzzing in ears ( improving), fatigue, and nausea without vomiting. She denies    Review of Systems See HPI   Past Medical History:  Diagnosis Date   Anxiety    Arthritis    Chronic back pain    Chronic kidney disease 2019   ARF while in hospital for PLIF   Depression    GERD (gastroesophageal reflux disease)    Hyperlipidemia    Hypertension    Hypothyroidism    Macular degeneration of both eyes    not age related   Osteopenia    PONV (postoperative nausea and vomiting)    also is slow to wake up   Prediabetes     Social History   Socioeconomic History   Marital status: Married    Spouse name: Not on file   Number of children: Not on file   Years of education: Not on file   Highest education level: Not on file  Occupational History   Not on file  Tobacco Use   Smoking status: Never   Smokeless tobacco: Never  Vaping Use   Vaping Use: Never used  Substance and Sexual Activity   Alcohol use: Never   Drug use: Never   Sexual activity: Not on file  Other Topics Concern   Not on file  Social History Narrative   Not on file   Social Determinants of Health   Financial Resource Strain: Low Risk  (06/23/2022)   Overall Financial Resource Strain (CARDIA)    Difficulty of  Paying Living Expenses: Not hard at all  Food Insecurity: No Food Insecurity (02/03/2022)   Hunger Vital Sign    Worried About Running Out of Food in the Last Year: Never true    Ran Out of Food in the Last Year: Never true  Transportation Needs: No Transportation Needs (06/23/2022)   PRAPARE - Administrator, Civil Service (Medical): No    Lack of Transportation (Non-Medical): No  Physical Activity: Inactive (02/03/2022)   Exercise Vital Sign    Days of Exercise per Week: 0 days    Minutes of Exercise per Session: 0 min  Stress: No Stress Concern Present (02/03/2022)   [employer] of Occupational Health - Occupational Stress Questionnaire    Feeling of Stress : Not at all  Social Connections: Not on file  Intimate Partner Violence: Not on file    Past Surgical History:  Procedure Laterality Date   ABDOMINAL HYSTERECTOMY     total   ANTERIOR CERVICAL DISCECTOMY     APPENDECTOMY     BACK SURGERY     PLIF   BUNIONECTOMY Right    CHOLECYSTECTOMY     COLONOSCOPY     HAND SURGERY Left    thumb joint, carpal tunnel and cyst   HAND SURGERY Right  thumb and carpal tunnel   POSTERIOR CERVICAL FUSION/FORAMINOTOMY      Family History  Problem Relation Age of Onset   Breast cancer Neg Hx     Allergies  Allergen Reactions   Celecoxib     Other reaction(s): itching   Citalopram Hydrobromide     Other reaction(s): nausea   Codeine Nausea Only and Other (See Comments)    Severe headache   Latex Itching    Other reaction(s): itching----GLOVES   Meperidine Hcl     Other reaction(s): itching   Morphine And Related Nausea And Vomiting    And headaches   Tape Itching   Trazodone Hcl     Other reaction(s): nightmares    Current Outpatient Medications on File Prior to Visit  Medication Sig Dispense Refill   acetaminophen (TYLENOL) 650 MG CR tablet Take 650 mg by mouth every 8 (eight) hours as needed for pain.     ALPRAZolam (XANAX) 0.5 MG tablet Take 0.5  tablets (0.25 mg total) by mouth at bedtime as needed for sleep. 30 tablet 2   Bacillus Coagulans-Inulin (PROBIOTIC) 1-250 BILLION-MG CAPS      cyclobenzaprine (FLEXERIL) 10 MG tablet Take 1 tablet (10 mg total) by mouth 3 (three) times daily as needed for muscle spasms. 30 tablet 0   DULoxetine (CYMBALTA) 30 MG capsule Take 1 capsule (30 mg total) by mouth at bedtime. (Patient taking differently: Take 60 mg by mouth daily.) 90 capsule 1   lansoprazole (PREVACID) 15 MG capsule TAKE 1 CAPSULE BY MOUTH DAILY AT 12 NOON 90 capsule 0   levothyroxine (SYNTHROID) 75 MCG tablet Take 75 mcg by mouth daily before breakfast.     lisinopril (ZESTRIL) 20 MG tablet Take 10 mg by mouth at bedtime.     Multiple Vitamin (MULTIVITAMIN WITH MINERALS) TABS tablet Take 1 tablet by mouth daily.     Semaglutide,0.25 or 0.5MG /DOS, (OZEMPIC, 0.25 OR 0.5 MG/DOSE,) 2 MG/3ML SOPN Inject 0.5 mg into the skin once a week. 3 mL 2   simvastatin (ZOCOR) 5 MG tablet Take 1 tablet (5 mg total) by mouth daily. 90 tablet 3   tiZANidine (ZANAFLEX) 2 MG tablet Take 2 mg by mouth every 6 (six) hours as needed for muscle spasms.     traMADol (ULTRAM) 50 MG tablet TAKE ONE TABLET BY MOUTH TWICE A DAY AS NEEDED 60 tablet 2   No current facility-administered medications on file prior to visit.    BP 130/86   Pulse (!) 105   Temp 98 F (36.7 C) (Oral)   Ht 5' (1.524 m)   Wt 175 lb (79.4 kg)   SpO2 98%   BMI 34.18 kg/m       Objective:   Physical Exam Vitals and nursing note reviewed.  Constitutional:      Appearance: Normal appearance.  HENT:     Head:      Right Ear: Tympanic membrane, ear canal and external ear normal. There is no impacted cerumen. No hemotympanum.     Left Ear: Tympanic membrane, ear canal and external ear normal. There is no impacted cerumen. No hemotympanum.  Eyes:     Extraocular Movements: Extraocular movements intact.     Conjunctiva/sclera: Conjunctivae normal.     Pupils: Pupils are equal,  round, and reactive to light.  Cardiovascular:     Rate and Rhythm: Normal rate and regular rhythm.     Pulses: Normal pulses.     Heart sounds: Normal heart sounds.  Pulmonary:  Effort: Pulmonary effort is normal.     Breath sounds: Normal breath sounds.  Musculoskeletal:        General: Normal range of motion.  Skin:    General: Skin is warm and dry.     Capillary Refill: Capillary refill takes less than 2 seconds.     Findings: Bruising (bruise on left forehead) present.  Neurological:     General: No focal deficit present.     Mental Status: She is alert and oriented to person, place, and time.     Cranial Nerves: No cranial nerve deficit.     Sensory: No sensory deficit.     Motor: No weakness.     Coordination: Coordination normal.     Gait: Gait normal.     Deep Tendon Reflexes: Reflexes normal.  Psychiatric:        Mood and Affect: Mood normal.        Behavior: Behavior normal.        Thought Content: Thought content normal.        Judgment: Judgment normal.       Assessment & Plan:  1. Injury of head, initial encounter - likely mild concussion - Advised rest, calm activities and refraining from screen time.  - Did not want CT scan  - Advised of red flags and to follow up if symptoms change  2. Nausea  - ondansetron (ZOFRAN) 4 MG tablet; Take 1 tablet (4 mg total) by mouth every 8 (eight) hours as needed for nausea or vomiting.  Dispense: 20 tablet; Refill: 2  [name], NP  Time spent with patient today was 30 minutes which consisted of chart review, discussing concussions,treatment ,answering questions and documentation.

## 2022-10-21 ENCOUNTER — Encounter: Payer: Self-pay | Admitting: Adult Health

## 2022-10-21 NOTE — Progress Notes (Signed)
[facility] Quality Team Note  Name: [name] Date of Birth: 1946-03-06 MRN: [id number] Date: 10/21/2022  [facility] Quality Team has reviewed this patient's chart, please see recommendations below:  [facility] Quality Other; (KED GAP- KIDNEY HEALTH EVALUATION- PATIENT NEEDS URINE MICROALBUMIN/CREATININE RATIO TEST COMPLETED BEFORE END OF YEAR FOR GAP CLOSURE.)

## 2022-10-22 ENCOUNTER — Telehealth (INDEPENDENT_AMBULATORY_CARE_PROVIDER_SITE_OTHER): Payer: Medicare Other | Admitting: Adult Health

## 2022-10-22 ENCOUNTER — Encounter: Payer: Self-pay | Admitting: Adult Health

## 2022-10-22 VITALS — Ht 60.0 in | Wt 175.0 lb

## 2022-10-22 DIAGNOSIS — J0140 Acute pansinusitis, unspecified: Secondary | ICD-10-CM | POA: Diagnosis not present

## 2022-10-22 MED ORDER — FLUTICASONE PROPIONATE 50 MCG/ACT NA SUSP: 2.0000 | Freq: Every day | NASAL | 6 refills | Status: AC

## 2022-10-22 MED ORDER — DOXYCYCLINE HYCLATE 100 MG PO CAPS: 100.0000 mg | ORAL_CAPSULE | Freq: Two times a day (BID) | ORAL | 0 refills | Status: DC

## 2022-10-22 NOTE — Progress Notes (Signed)
Virtual Visit via Video Note  I connected with [name] on 10/22/22 at 10:45 AM EST by a video enabled telemedicine application and verified that I am speaking with the correct person using two identifiers.  Location patient: home Location provider:work or home office Persons participating in the virtual visit: patient, provider  I discussed the limitations of evaluation and management by telemedicine and the availability of in person appointments. The patient expressed understanding and agreed to proceed.   HPI: 76 year old female who is being evaluated today for an acute issue.  Her symptoms started over the last week and a half.  Progressively getting worse.  Symptoms include nasal congestion, sinus pain and pressure, headache, low-grade fever up to 99.1, and " pain in my teeth".  At home she has been using Sudafed and Delsym with minimal improvement.   ROS: See pertinent positives and negatives per HPI.  Past Medical History:  Diagnosis Date   Anxiety    Arthritis    Chronic back pain    Chronic kidney disease 2019   ARF while in hospital for PLIF   Depression    GERD (gastroesophageal reflux disease)    Hyperlipidemia    Hypertension    Hypothyroidism    Macular degeneration of both eyes    not age related   Osteopenia    PONV (postoperative nausea and vomiting)    also is slow to wake up   Prediabetes     Past Surgical History:  Procedure Laterality Date   ABDOMINAL HYSTERECTOMY     total   ANTERIOR CERVICAL DISCECTOMY     APPENDECTOMY     BACK SURGERY     PLIF   BUNIONECTOMY Right    CHOLECYSTECTOMY     COLONOSCOPY     HAND SURGERY Left    thumb joint, carpal tunnel and cyst   HAND SURGERY Right    thumb and carpal tunnel   POSTERIOR CERVICAL FUSION/FORAMINOTOMY      Family History  Problem Relation Age of Onset   Breast cancer Neg Hx        Current Outpatient Medications:    acetaminophen (TYLENOL) 650 MG CR tablet, Take 650 mg by mouth  every 8 (eight) hours as needed for pain., Disp: , Rfl:    ALPRAZolam (XANAX) 0.5 MG tablet, Take 0.5 tablets (0.25 mg total) by mouth at bedtime as needed for sleep., Disp: 30 tablet, Rfl: 2   Bacillus Coagulans-Inulin (PROBIOTIC) 1-250 BILLION-MG CAPS, , Disp: , Rfl:    cyclobenzaprine (FLEXERIL) 10 MG tablet, Take 1 tablet (10 mg total) by mouth 3 (three) times daily as needed for muscle spasms., Disp: 30 tablet, Rfl: 0   doxycycline (VIBRAMYCIN) 100 MG capsule, Take 1 capsule (100 mg total) by mouth 2 (two) times daily., Disp: 20 capsule, Rfl: 0   DULoxetine (CYMBALTA) 30 MG capsule, Take 1 capsule (30 mg total) by mouth at bedtime. (Patient taking differently: Take 60 mg by mouth daily.), Disp: 90 capsule, Rfl: 1   fluticasone (FLONASE) 50 MCG/ACT nasal spray, Place 2 sprays into both nostrils daily., Disp: 16 g, Rfl: 6   lansoprazole (PREVACID) 15 MG capsule, TAKE 1 CAPSULE BY MOUTH DAILY AT 12 NOON, Disp: 90 capsule, Rfl: 0   levothyroxine (SYNTHROID) 75 MCG tablet, Take 75 mcg by mouth daily before breakfast., Disp: , Rfl:    lisinopril (ZESTRIL) 20 MG tablet, Take 10 mg by mouth at bedtime., Disp: , Rfl:    Multiple Vitamin (MULTIVITAMIN WITH MINERALS) TABS tablet, Take  1 tablet by mouth daily., Disp: , Rfl:    ondansetron (ZOFRAN) 4 MG tablet, Take 1 tablet (4 mg total) by mouth every 8 (eight) hours as needed for nausea or vomiting., Disp: 20 tablet, Rfl: 2   Semaglutide,0.25 or 0.5MG /DOS, (OZEMPIC, 0.25 OR 0.5 MG/DOSE,) 2 MG/3ML SOPN, Inject 0.5 mg into the skin once a week., Disp: 3 mL, Rfl: 2   simvastatin (ZOCOR) 5 MG tablet, Take 1 tablet (5 mg total) by mouth daily., Disp: 90 tablet, Rfl: 3   tiZANidine (ZANAFLEX) 2 MG tablet, Take 2 mg by mouth every 6 (six) hours as needed for muscle spasms., Disp: , Rfl:    traMADol (ULTRAM) 50 MG tablet, TAKE ONE TABLET BY MOUTH TWICE A DAY AS NEEDED, Disp: 60 tablet, Rfl: 2  EXAM:  VITALS per patient if applicable:  GENERAL: alert,  oriented, appears well and in no acute distress  HEENT: atraumatic, conjunttiva clear, no obvious abnormalities on inspection of external nose and ears  NECK: normal movements of the head and neck  LUNGS: on inspection no signs of respiratory distress, breathing rate appears normal, no obvious gross SOB, gasping or wheezing  CV: no obvious cyanosis  MS: moves all visible extremities without noticeable abnormality  PSYCH/NEURO: pleasant and cooperative, no obvious depression or anxiety, speech and thought processing grossly intact  ASSESSMENT AND PLAN:  Discussed the following assessment and plan:  Acute non-recurrent pansinusitis - Plan: doxycycline (VIBRAMYCIN) 100 MG capsule, fluticasone (FLONASE) 50 MCG/ACT nasal spray - Follow up if not improving in the next 2-3 days - Stay hydrated and rest     I discussed the assessment and treatment plan with the patient. The patient was provided an opportunity to ask questions and all were answered. The patient agreed with the plan and demonstrated an understanding of the instructions.   The patient was advised to call back or seek an in-person evaluation if the symptoms worsen or if the condition fails to improve as anticipated.   Dorothyann Peng, NP

## 2022-10-23 ENCOUNTER — Encounter: Payer: Self-pay | Admitting: Adult Health

## 2022-10-23 ENCOUNTER — Encounter: Payer: Self-pay | Admitting: Family Medicine

## 2022-10-23 ENCOUNTER — Telehealth (INDEPENDENT_AMBULATORY_CARE_PROVIDER_SITE_OTHER): Payer: Medicare Other | Admitting: Family Medicine

## 2022-10-23 VITALS — Temp 99.1°F | Ht 60.0 in | Wt 175.0 lb

## 2022-10-23 DIAGNOSIS — U071 COVID-19: Secondary | ICD-10-CM | POA: Diagnosis not present

## 2022-10-23 MED ORDER — BENZONATATE 100 MG PO CAPS: ORAL_CAPSULE | ORAL | 0 refills | Status: DC

## 2022-10-23 MED ORDER — NIRMATRELVIR/RITONAVIR (PAXLOVID) TABLET (RENAL DOSING): 2.0000 | ORAL_TABLET | Freq: Two times a day (BID) | ORAL | 0 refills | Status: AC

## 2022-10-23 NOTE — Patient Instructions (Signed)
HOME CARE TIPS:  STOP the antibioitic  -I sent the medication(s) we discussed to your pharmacy: Meds ordered this encounter  Medications   nirmatrelvir/ritonavir EUA, renal dosing, (PAXLOVID) 10 x 150 MG & 10 x 100MG  TABS    Sig: Take 2 tablets by mouth 2 (two) times daily for 5 days. (Take nirmatrelvir 150 mg one tablet twice daily for 5 days and ritonavir 100 mg one tablet twice daily for 5 days) Patient GFR is 58 on last check    Dispense:  20 tablet    Refill:  0   benzonatate (TESSALON PERLES) 100 MG capsule    Sig: 1-2 capsules up to twice daily as needed for cough    Dispense:  30 capsule    Refill:  0     -I sent in the Covid19 treatment or referral you requested per our discussion. Please see the information provided below and discuss further with the pharmacist/treatment team.  -If taking Paxlovid, please review all medications, supplement and over the counter drugs with your pharmacist and ask them to check for any interactions. Please make the following changes to your regular medications while taking Paxlovid: *Please HOLD the simvastatin (Zocor) and the Tramadol (ultram) while taking the paxlovid and restart 3 days after finishing paxlovid *don't use your xanax (alprazolam) while on the paxlovid  -there is a chance of rebound illness with covid after improving. This can happen whether or not you take an antiviral treatment. If you become sick again with covid after getting better, please schedule a follow up virtual visit and isolate again.  -can use tylenolif needed for fevers, aches and pains per instructions  -nasal saline sinus rinses twice daily  -stay hydrated, drink plenty of fluids and eat small healthy meals - avoid dairy   -follow up with your doctor in 2-3 days unless improving and feeling better  -stay home while sick, except to seek medical care. If you have COVID19, you will likely be contagious for 7-10 days. Flu or Influenza is likely contagious for  about 7 days. Other respiratory viral infections remain contagious for 5-10+ days depending on the virus and many other factors. Wear a good mask that fits snugly (such as N95 or KN95) if around others to reduce the risk of transmission.  It was nice to meet you today, and I really hope you are feeling better soon. I help [facility] out with telemedicine visits on Tuesdays and Thursdays and am happy to help if you need a follow up virtual visit on those days. Otherwise, if you have any concerns or questions following this visit please schedule a follow up visit with your Primary Care doctor or seek care at a local urgent care clinic to avoid delays in care.    Seek in person care or schedule a follow up video visit promptly if your symptoms worsen, new concerns arise or you are not improving with treatment. Call 911 and/or seek emergency care if your symptoms are severe or life threatening.   See the following link for the most recent information regarding Paxlovid:  [email or web address].com   Nirmatrelvir; Ritonavir Tablets What is this medication? NIRMATRELVIR; RITONAVIR (NIR ma TREL vir; ri TOE na veer) treats mild to moderate COVID-19. It may help people who are at high risk of developing severe illness. This medication works by limiting the spread of the virus in your body. The FDA has allowed the emergency use of this medication. This medicine may be used for other purposes; ask your health  care provider or pharmacist if you have questions. COMMON BRAND NAME(S): PAXLOVID What should I tell my care team before I take this medication? They need to know if you have any of these conditions: Any allergies Any serious illness Kidney disease Liver disease An unusual or allergic reaction to nirmatrelvir, ritonavir, other medications, foods, dyes, or preservatives Pregnant or trying to get pregnant Breast-feeding How should I use this medication? This product contains 2 different medications that  are packaged together. For the standard dose, take 2 pink tablets of nirmatrelvir with 1 white tablet of ritonavir (3 tablets total) by mouth with water twice daily. Talk to your care team if you have kidney disease. You may need a different dose. Swallow the tablets whole. You can take it with or without food. If it upsets your stomach, take it with food. Take all of this medication unless your care team tells you to stop it early. Keep taking it even if you think you are better. Talk to your care team about the use of this medication in children. While it may be prescribed for children as young as 12 years for selected conditions, precautions do apply. Overdosage: If you think you have taken too much of this medicine contact a poison control center or emergency room at once. NOTE: This medicine is only for you. Do not share this medicine with others. What if I miss a dose? If you miss a dose, take it as soon as you can unless it is more than 8 hours late. If it is more than 8 hours late, skip the missed dose. Take the next dose at the normal time. Do not take extra or 2 doses at the same time to make up for the missed dose. What may interact with this medication? Do not take this medication with any of the following medications: Alfuzosin Certain medications for anxiety or sleep like midazolam, triazolam Certain medications for cancer like apalutamide, enzalutamide Certain medications for cholesterol like lovastatin, simvastatin Certain medications for irregular heart beat like amiodarone, dronedarone, flecainide, propafenone, quinidine Certain medications for pain like meperidine, piroxicam Certain medications for psychotic disorders like clozapine, lurasidone, pimozide Certain medications for seizures like carbamazepine, phenobarbital, phenytoin Colchicine Eletriptan Eplerenone Ergot alkaloids like dihydroergotamine, ergonovine, ergotamine,  methylergonovine Finerenone Flibanserin Ivabradine Lomitapide Naloxegol Ranolazine Rifampin Sildenafil Silodosin St. John's Wort Tolvaptan Ubrogepant Voclosporin This medication may also interact with the following medications: Bedaquiline Birth control pills Bosentan Certain antibiotics like erythromycin or clarithromycin Certain medications for blood pressure like amlodipine, diltiazem, felodipine, nicardipine, nifedipine Certain medications for cancer like abemaciclib, ceritinib, dasatinib, encorafenib, ibrutinib, ivosidenib, neratinib, nilotinib, venetoclax, vinblastine, vincristine Certain medications for cholesterol like atorvastatin, rosuvastatin Certain medications for depression like bupropion, trazodone Certain medications for fungal infections like isavuconazonium, itraconazole, ketoconazole, voriconazole Certain medications for hepatitis C like elbasvir; grazoprevir, dasabuvir; ombitasvir; paritaprevir; ritonavir, glecaprevir; pibrentasvir, sofosbuvir; velpatasvir; voxilaprevir Certain medications for HIV or AIDS Certain medications for irregular heartbeat like lidocaine Certain medications that treat or prevent blood clots like rivaroxaban, warfarin Digoxin Fentanyl Medications that lower your chance of fighting infection like cyclosporine, sirolimus, tacrolimus Methadone Quetiapine Rifabutin Salmeterol Steroid medications like betamethasone, budesonide, ciclesonide, dexamethasone, fluticasone, methylprednisone, mometasone, triamcinolone This list may not describe all possible interactions. Give your health care provider a list of all the medicines, herbs, non-prescription drugs, or dietary supplements you use. Also tell them if you smoke, drink alcohol, or use illegal drugs. Some items may interact with your medicine. What should I watch for while using this medication? Your  condition will be monitored carefully while you are receiving this medication. Visit your  care team for regular checkups. Tell your care team if your symptoms do not start to get better or if they get worse. If you have untreated HIV infection, this medication may lead to some HIV medications not working as well in the future. Birth control may not work properly while you are taking this medication. Talk to your care team about using an extra method of birth control. What side effects may I notice from receiving this medication? Side effects that you should report to your care team as soon as possible: Allergic reactions--skin rash, itching, hives, swelling of the face, lips, tongue, or throat Liver injury--right upper belly pain, loss of appetite, nausea, light-colored stool, dark yellow or brown urine, yellowing skin or eyes, unusual weakness or fatigue Redness, blistering, peeling, or loosening of the skin, including inside the mouth Side effects that usually do not require medical attention (report these to your care team if they continue or are bothersome): Change in taste Diarrhea General discomfort and fatigue Increase in blood pressure Muscle pain Nausea Stomach pain This list may not describe all possible side effects. Call your doctor for medical advice about side effects. You may report side effects to FDA at 1-800-FDA-1088. Where should I keep my medication? Keep out of the reach of children and pets. Store at room temperature between 20 and 25 degrees C (68 and 77 degrees F). Get rid of any unused medication after the expiration date. To get rid of medications that are no longer needed or have expired: Take the medication to a medication take-back program. Check with your pharmacy or law enforcement to find a location. If you cannot return the medication, check the label or package insert to see if the medication should be thrown out in the garbage or flushed down the toilet. If you are not sure, ask your care team. If it is safe to put it in the trash, take the  medication out of the container. Mix the medication with cat litter, dirt, coffee grounds, or other unwanted substance. Seal the mixture in a bag or container. Put it in the trash. NOTE: This sheet is a summary. It may not cover all possible information. If you have questions about this medicine, talk to your doctor, pharmacist, or health care provider.  2022 Elsevier/Gold Standard (2021-08-05 00:00:00)

## 2022-10-23 NOTE — Progress Notes (Signed)
Virtual Visit via Video Note  I connected with [name]  on 10/23/22 at  6:20 PM EST by a video enabled telemedicine application and verified that I am speaking with the correct person using two identifiers.  Location patient: [place] Location provider:work or home office Persons participating in the virtual visit: patient, provider  I discussed the limitations and requested verbal permission for telemedicine visit. The patient expressed understanding and agreed to proceed.   HPI:  Acute telemedicine visit for Covid19: -Onset: about 2-3 days ago, at first thought was a sinus infection, but today took a covid test and it was positive -Symptoms include: sinus congestion, cough, sore throat, fatigue, low grade temp yesterday 99 yesterday -Denies: CP, SOB, NVD, severe headache -she reports can stop the tramadol and the simvastatin and the xanax (she doesn't take) for the paxlovid course +3 days -Has tried: started an abx yesterday thinking it was a sinus infection -Pertinent past medical history: see below, had covid in the past once, has not had any covid shots, GFR 58 -Pertinent medication allergies: Allergies  Allergen Reactions   Celecoxib     Other reaction(s): itching   Citalopram Hydrobromide     Other reaction(s): nausea   Codeine Nausea Only and Other (See Comments)    Severe headache   Latex Itching    Other reaction(s): itching----GLOVES   Meperidine Hcl     Other reaction(s): itching   Morphine And Related Nausea And Vomiting    And headaches   Tape Itching   Trazodone Hcl     Other reaction(s): nightmares   -COVID-19 vaccine status:  Immunization History  Administered Date(s) Administered   Influenza Split 08/05/2010, 08/03/2012, 09/18/2013, 08/15/2014, 08/27/2016, 07/24/2017, 08/01/2019, 09/17/2021   Influenza, High Dose Seasonal PF 08/07/2015, 07/26/2018, 07/27/2019   Influenza-Unspecified 08/31/2022   PFIZER(Purple Top)SARS-COV-2 Vaccination 12/29/2019, 01/23/2020,  08/20/2020   Pneumococcal Conjugate-13 04/07/2014   Pneumococcal Polysaccharide-23 04/05/2013   Tdap 04/03/2008, 02/02/2017   Zoster Recombinat (Shingrix) 04/22/2017, 07/24/2017   Zoster, Live 04/03/2008, 04/22/2017, 07/24/2017     ROS: See pertinent positives and negatives per HPI.  Past Medical History:  Diagnosis Date   Anxiety    Arthritis    Chronic back pain    Chronic kidney disease 2019   ARF while in hospital for PLIF   Depression    GERD (gastroesophageal reflux disease)    Hyperlipidemia    Hypertension    Hypothyroidism    Macular degeneration of both eyes    not age related   Osteopenia    PONV (postoperative nausea and vomiting)    also is slow to wake up   Prediabetes     Past Surgical History:  Procedure Laterality Date   ABDOMINAL HYSTERECTOMY     total   ANTERIOR CERVICAL DISCECTOMY     APPENDECTOMY     BACK SURGERY     PLIF   BUNIONECTOMY Right    CHOLECYSTECTOMY     COLONOSCOPY     HAND SURGERY Left    thumb joint, carpal tunnel and cyst   HAND SURGERY Right    thumb and carpal tunnel   POSTERIOR CERVICAL FUSION/FORAMINOTOMY       Current Outpatient Medications:    acetaminophen (TYLENOL) 650 MG CR tablet, Take 650 mg by mouth every 8 (eight) hours as needed for pain., Disp: , Rfl:    ALPRAZolam (XANAX) 0.5 MG tablet, Take 0.5 tablets (0.25 mg total) by mouth at bedtime as needed for sleep., Disp: 30 tablet, Rfl: 2  Bacillus Coagulans-Inulin (PROBIOTIC) 1-250 BILLION-MG CAPS, , Disp: , Rfl:    benzonatate (TESSALON PERLES) 100 MG capsule, 1-2 capsules up to twice daily as needed for cough, Disp: 30 capsule, Rfl: 0   cyclobenzaprine (FLEXERIL) 10 MG tablet, Take 1 tablet (10 mg total) by mouth 3 (three) times daily as needed for muscle spasms., Disp: 30 tablet, Rfl: 0   doxycycline (VIBRAMYCIN) 100 MG capsule, Take 1 capsule (100 mg total) by mouth 2 (two) times daily., Disp: 20 capsule, Rfl: 0   DULoxetine (CYMBALTA) 30 MG capsule, Take 1  capsule (30 mg total) by mouth at bedtime. (Patient taking differently: Take 60 mg by mouth daily.), Disp: 90 capsule, Rfl: 1   fluticasone (FLONASE) 50 MCG/ACT nasal spray, Place 2 sprays into both nostrils daily., Disp: 16 g, Rfl: 6   lansoprazole (PREVACID) 15 MG capsule, TAKE 1 CAPSULE BY MOUTH DAILY AT 12 NOON, Disp: 90 capsule, Rfl: 0   levothyroxine (SYNTHROID) 75 MCG tablet, Take 75 mcg by mouth daily before breakfast., Disp: , Rfl:    lisinopril (ZESTRIL) 20 MG tablet, Take 10 mg by mouth at bedtime., Disp: , Rfl:    Multiple Vitamin (MULTIVITAMIN WITH MINERALS) TABS tablet, Take 1 tablet by mouth daily., Disp: , Rfl:    nirmatrelvir/ritonavir EUA, renal dosing, (PAXLOVID) 10 x 150 MG & 10 x 100MG  TABS, Take 2 tablets by mouth 2 (two) times daily for 5 days. (Take nirmatrelvir 150 mg one tablet twice daily for 5 days and ritonavir 100 mg one tablet twice daily for 5 days) Patient GFR is 58 on last check, Disp: 20 tablet, Rfl: 0   ondansetron (ZOFRAN) 4 MG tablet, Take 1 tablet (4 mg total) by mouth every 8 (eight) hours as needed for nausea or vomiting., Disp: 20 tablet, Rfl: 2   Semaglutide,0.25 or 0.5MG /DOS, (OZEMPIC, 0.25 OR 0.5 MG/DOSE,) 2 MG/3ML SOPN, Inject 0.5 mg into the skin once a week., Disp: 3 mL, Rfl: 2   simvastatin (ZOCOR) 5 MG tablet, Take 1 tablet (5 mg total) by mouth daily., Disp: 90 tablet, Rfl: 3   tiZANidine (ZANAFLEX) 2 MG tablet, Take 2 mg by mouth every 6 (six) hours as needed for muscle spasms., Disp: , Rfl:    traMADol (ULTRAM) 50 MG tablet, TAKE ONE TABLET BY MOUTH TWICE A DAY AS NEEDED, Disp: 60 tablet, Rfl: 2  EXAM:  VITALS per patient if applicable:  GENERAL: alert, oriented, appears well and in no acute distress  HEENT: atraumatic, conjunttiva clear, no obvious abnormalities on inspection of external nose and ears  NECK: normal movements of the head and neck  LUNGS: on inspection no signs of respiratory distress, breathing rate appears normal, no  obvious gross SOB, gasping or wheezing  CV: no obvious cyanosis  MS: moves all visible extremities without noticeable abnormality  PSYCH/NEURO: pleasant and cooperative, no obvious depression or anxiety, speech and thought processing grossly intact  ASSESSMENT AND PLAN:  Discussed the following assessment and plan:  COVID-19   Discussed treatment options, side effect and risk of drug interactions, ideal treatment window, potential complications, isolation and precautions for COVID-19.  Discussed possibility of rebound with or without antivirals. Checked for/reviewed last GFR - listed in HPI if available.  After lengthy discussion, the patient opted for treatment with renal dose of Paxlovid due to being higher risk for complications of covid or severe disease and other factors. Discussed EUA status of this drug and the fact that there is preliminary limited knowledge of risks/interactions/side effects per EUA  document vs possible benefits and precautions. She reports she doesn't take xanax and will not take during tx. She agrees to hold the statin and tramadol (only takes once daily). This information was shared with patient during the visit and also was provided in patient instructions. Also, advised that patient discuss risks/interactions and use with pharmacist/treatment team as well.  The patient did want a prescription for cough, Tessalon Rx sent.  Other symptomatic care measures summarized in patient instructions.  Advised to seek prompt virtual visit or in person care if worsening, new symptoms arise, or if is not improving with treatment as expected per our conversation of expected course. Discussed options for follow up care. Did let this patient know that I do telemedicine on Tuesdays and Thursdays for [facility] and those are the days I am logged into the system. Advised to schedule follow up visit with PCP, [facility] virtual visits or [facility] if any further questions or concerns to avoid  delays in care.   I discussed the assessment and treatment plan with the patient. The patient was provided an opportunity to ask questions and all were answered. The patient agreed with the plan and demonstrated an understanding of the instructions.     Terressa Koyanagi, DO

## 2022-10-23 NOTE — Telephone Encounter (Signed)
Patient added to Dr. Elmyra Ricks schedule for today

## 2022-10-26 ENCOUNTER — Encounter: Payer: Self-pay | Admitting: Adult Health

## 2022-10-28 ENCOUNTER — Other Ambulatory Visit: Payer: Self-pay | Admitting: Adult Health

## 2022-10-28 DIAGNOSIS — U071 COVID-19: Secondary | ICD-10-CM

## 2022-10-28 MED ORDER — MOLNUPIRAVIR EUA 200MG CAPSULE: 4.0000 | ORAL_CAPSULE | Freq: Two times a day (BID) | ORAL | 0 refills | Status: AC

## 2022-10-28 NOTE — Telephone Encounter (Signed)
Please advise 

## 2022-11-08 ENCOUNTER — Other Ambulatory Visit: Payer: Self-pay | Admitting: Adult Health

## 2022-11-12 ENCOUNTER — Other Ambulatory Visit: Payer: Self-pay | Admitting: Adult Health

## 2022-11-12 NOTE — Telephone Encounter (Signed)
Okay for refill?  

## 2022-11-19 ENCOUNTER — Telehealth: Payer: Self-pay | Admitting: Pharmacist

## 2022-11-19 NOTE — Progress Notes (Signed)
Care Coordination Pharmacy Assistant   Name: [name]  MRN: [id number] DOB: [birth year]   Contacted patient on 11/19/2022 for general follow up call.   Spoke with patient, she states she is doing fine overall, she has had to be more attentive to her husband since he started on Rexulti as he has become more active and getting into things he shouldn't, he is up more often at night so she is up more often at night due to this.    Care Gaps: AWV - scheduled 02/09/2023 Last BP - 130/86 on 10/14/2022 Last A1C - 6.0 on 04/22/2022 Covid - overdue Colonoscopy - postponed  Star Rating Drugs: Simvastatin 5 mg  - last filled 10/18/2022 90 DS at [facility] Lisinopril 10 mg - last filled 10/20/2022 30 DS at [facility] Pharmacist Assistant [phone]

## 2022-11-24 ENCOUNTER — Other Ambulatory Visit: Payer: Self-pay | Admitting: Adult Health

## 2022-11-24 MED ORDER — LEVOTHYROXINE SODIUM 75 MCG PO TABS: 75.0000 ug | ORAL_TABLET | Freq: Every day | ORAL | 1 refills | Status: DC

## 2022-12-02 ENCOUNTER — Encounter: Payer: Self-pay | Admitting: Adult Health

## 2022-12-02 NOTE — Telephone Encounter (Signed)
Please advise 

## 2022-12-04 ENCOUNTER — Other Ambulatory Visit: Payer: Self-pay | Admitting: Adult Health

## 2022-12-22 ENCOUNTER — Encounter: Payer: Self-pay | Admitting: Adult Health

## 2022-12-23 NOTE — Telephone Encounter (Signed)
Please advise 

## 2023-01-08 ENCOUNTER — Encounter (INDEPENDENT_AMBULATORY_CARE_PROVIDER_SITE_OTHER): Payer: Medicare Other | Admitting: Ophthalmology

## 2023-01-31 ENCOUNTER — Other Ambulatory Visit: Payer: Self-pay | Admitting: Adult Health

## 2023-02-06 ENCOUNTER — Telehealth: Payer: Self-pay

## 2023-02-06 NOTE — Progress Notes (Unsigned)
Care Management & Coordination Services Pharmacy Team  Reason for Encounter: Hypertension  Contacted patient to discuss hypertension disease state. {US HC Outreach:28874}  SCHEDULE FOLLOW UP I sched sue on 4/10 at 1:30 it is sched 30 min prior to husbands appt at 2:00 on 4/10.   Current antihypertensive regimen:  Lisinopril 20 mg daily Patient verbally confirms she is taking the above medications as directed. {yes/no:20286}  How often are you checking your Blood Pressure? {CHL HP BP Monitoring [phone]}  she checks her blood pressure {timing:25218} {before/after:25217} taking her medication.  Current home BP readings: ***  DATE:             BP               PULSE   Wrist or arm cuff:  OTC medications including pseudoephedrine or NSAIDs?  Any readings above 180/100? {yes/no:20286} If yes any symptoms of hypertensive emergency? {hypertensive emergency symptoms:25354}  What recent interventions/DTPs have been made by any provider to improve Blood Pressure control since last CPP Visit: ***  Any recent hospitalizations or ED visits since last visit with CPP? {yes/no:20286}  What diet changes have been made to improve Blood Pressure Control?  Patient follows Breakfast -  Lunch -  Dinner -  Caffeine intake: Salt intake:  What exercise is being done to improve your Blood Pressure Control?  ***  Adherence Review: Is the patient currently on ACE/ARB medication? {yes/no:20286} Does the patient have >5 day gap between last estimated fill dates? {yes/no:20286}  Care Gaps: AWV - scheduled 02/09/2023 Next appointment -  Last BP - 130/86 on 10/14/2022 Covid - overdue Colonoscopy - overdue   Star Rating Drugs: Simvastatin 5 mg  - last filled 01/08/2023 90 DS at [facility] Lisinopril 20 mg - last filled 10/20/2022 30 DS [facility]  Chart Updates: Recent office visits:  10/23/2022 Colin Benton DO - Patient was seen for [place]. Started [employer] and  Paxlovid.   10/22/2022 Dorothyann Peng NP - Patient was seen for acute non recurrent pansinusitis. Started Doxycycline and Flonase.   10/14/2022 Dorothyann Peng NP - Patient was seen for Injury of head, initial encounter and nausea. Started Ondansetron.   08/28/2022 Dorothyann Peng NP - Patient was seen for Depression, unspecified depression type and an additional concern. No medication changes.   Recent consult visits:  None  Hospital visits:  None  Medications: Outpatient Encounter Medications as of 02/06/2023  Medication Sig   acetaminophen (TYLENOL) 650 MG CR tablet Take 650 mg by mouth every 8 (eight) hours as needed for pain.   ALPRAZolam (XANAX) 0.5 MG tablet Take 0.5 tablets (0.25 mg total) by mouth at bedtime as needed for sleep.   Bacillus Coagulans-Inulin (PROBIOTIC) 1-250 BILLION-MG CAPS    benzonatate (TESSALON PERLES) 100 MG capsule 1-2 capsules up to twice daily as needed for cough   cyclobenzaprine (FLEXERIL) 10 MG tablet Take 1 tablet (10 mg total) by mouth 3 (three) times daily as needed for muscle spasms.   doxycycline (VIBRAMYCIN) 100 MG capsule Take 1 capsule (100 mg total) by mouth 2 (two) times daily.   DULoxetine (CYMBALTA) 30 MG capsule Take 1 capsule (30 mg total) by mouth at bedtime. (Patient taking differently: Take 60 mg by mouth daily.)   fluticasone (FLONASE) 50 MCG/ACT nasal spray Place 2 sprays into both nostrils daily.   lansoprazole (PREVACID) 15 MG capsule TAKE 1 CAPSULE BY MOUTH DAILY AT 12 NOON   levothyroxine (SYNTHROID) 75 MCG tablet Take 1 tablet (75 mcg total)  by mouth daily before breakfast.   lisinopril (ZESTRIL) 20 MG tablet Take 10 mg by mouth at bedtime.   Multiple Vitamin (MULTIVITAMIN WITH MINERALS) TABS tablet Take 1 tablet by mouth daily.   ondansetron (ZOFRAN) 4 MG tablet Take 1 tablet (4 mg total) by mouth every 8 (eight) hours as needed for nausea or vomiting.   OZEMPIC, 0.25 OR 0.5 MG/DOSE, 2 MG/3ML SOPN DIAL AND INJECT UNDER THE SKIN 0.5  MG WEEKLY   simvastatin (ZOCOR) 5 MG tablet Take 1 tablet (5 mg total) by mouth daily.   tiZANidine (ZANAFLEX) 2 MG tablet Take 2 mg by mouth every 6 (six) hours as needed for muscle spasms.   traMADol (ULTRAM) 50 MG tablet TAKE 1 TABLET BY MOUTH TWICE A DAY AS NEEDED   No facility-administered encounter medications on file as of 02/06/2023.  Fill History:   Dispensed Days Supply Quantity Provider Pharmacy  ALPRAZOLAM  0.5 MG TABS 01/08/2023 60 30 tablet      Dispensed Days Supply Quantity Provider Pharmacy  DULOXETINE HYDROCHLORIDE  60 MG CPEP 11/04/2022 90 90 capsule      Dispensed Days Supply Quantity Provider Pharmacy  FLUTICASONE PROPIONATE  50 MCG/ACT SUSP 01/08/2023 30 16 g      Dispensed Days Supply Quantity Provider Pharmacy  LANSOPRAZOLE  15 MG CPDR 12/05/2022 90 90 capsule      Dispensed Days Supply Quantity Provider Pharmacy  LEVOTHYROXINE SODIUM  75 MCG TABS 11/24/2022 90 90 tablet      Dispensed Days Supply Quantity Provider Pharmacy  LISINOPRIL  10 MG TABS 10/20/2022 30 30 tablet     ONDANSETRON HYDROCHLORIDE  4 MG TABS 01/22/2023 6 20 tablet    Dispensed Days Supply Quantity Provider Pharmacy  OZEMPIC  2 MG/3ML SOPN 01/03/2023 28 3 mL      Dispensed Days Supply Quantity Provider Pharmacy  SIMVASTATIN  5 MG TABS 01/08/2023 90 90 tablet      Dispensed Days Supply Quantity Provider Pharmacy  TIZANIDINE HCL  2 MG TABS 07/15/2021 90 90 tablet      Dispensed Days Supply Quantity Provider Pharmacy  TRAMADOL HYDROCHLORIDE  50 MG TABS 01/08/2023 30 60 tablet      Recent Office Vitals: BP Readings from Last 3 Encounters:  10/14/22 130/86  04/22/22 120/80  12/24/21 110/78   Pulse Readings from Last 3 Encounters:  10/14/22 (!) 105  08/28/22 (!) 102  04/22/22 98    Wt Readings from Last 3 Encounters:  10/23/22 175 lb (79.4 kg)  10/22/22 175 lb (79.4 kg)  10/14/22 175 lb (79.4 kg)     Kidney Function Lab Results  Component Value Date/Time   CREATININE 0.96  04/22/2022 11:44 AM   CREATININE 0.99 03/03/2021 05:57 PM   GFR 58.46 (L) 04/22/2022 11:44 AM   GFRNONAA >60 03/03/2021 05:57 PM   GFRAA  03/20/2008 12:01 PM    >60        The eGFR has been calculated using the MDRD equation. This calculation has not been validated in all clinical       Latest Ref Rng & Units 04/22/2022   11:44 AM 03/03/2021    5:57 PM 02/25/2021    6:42 AM  BMP  Glucose 70 - 99 mg/dL 95  125  100   BUN 6 - 23 mg/dL 18  14  28    Creatinine 0.40 - 1.20 mg/dL 0.96  0.99  1.09   Sodium 135 - 145 mEq/L 137  134  136   Potassium 3.5 -  5.1 mEq/L 5.1  4.2  3.8   Chloride 96 - 112 mEq/L 99  96  105   CO2 19 - 32 mEq/L 28  27  22    Calcium 8.4 - 10.5 mg/dL 10.0  9.2  9.2    [place] Pharmacist Assistant [phone]

## 2023-02-09 ENCOUNTER — Ambulatory Visit: Payer: Medicare Other

## 2023-02-09 ENCOUNTER — Encounter: Payer: Self-pay | Admitting: Adult Health

## 2023-02-09 ENCOUNTER — Telehealth: Payer: Self-pay

## 2023-02-09 NOTE — Telephone Encounter (Signed)
Unsuccessful attempt to reach patient on preferred number listed in notes for scheduled AWV. Left message on voicemail okay to reschedule. 

## 2023-02-10 ENCOUNTER — Encounter: Payer: Self-pay | Admitting: Family Medicine

## 2023-02-10 ENCOUNTER — Telehealth: Payer: Self-pay | Admitting: Adult Health

## 2023-02-10 ENCOUNTER — Telehealth (INDEPENDENT_AMBULATORY_CARE_PROVIDER_SITE_OTHER): Payer: Medicare Other | Admitting: Family Medicine

## 2023-02-10 VITALS — BP 135/68 | Temp 100.0°F

## 2023-02-10 DIAGNOSIS — Z0000 Encounter for general adult medical examination without abnormal findings: Secondary | ICD-10-CM | POA: Diagnosis not present

## 2023-02-10 NOTE — Progress Notes (Signed)
MEDICARE WELLNESS VISIT PATIENT CHECK-IN and HEALTH RISK ASSESSMENT QUESTIONNAIRE:  -completed by phone/video for upcoming Medicare Preventive Visit  Pre-Visit Check-in: 1)Vitals (height, wt, BP, etc) - record in vitals section for visit on day of visit 2)Review and Update Medications, Allergies PMH, Surgeries, Social history in Epic 3)Hospitalizations in the last year with date/reason? no  4)Review and Update Care Team (patient's specialists) in Epic 5) Complete PHQ9 in Epic  6) Complete Fall Screening in Epic 7)Review all Health Maintenance Due and order under PCP if not done.  8)Medicare Wellness Questionnaire: Answer theses question about your habits: Do you drink alcohol? no How many drinks do you have a day?N/A Have you ever smoked?no Have you stopped smoking and date if applicable?   How many packs a day do you smoke?  Do you use smokeless tobacco?no Do you use illicit drugs?no Do you exercises? No  Are you sexually active? No  Number of partners? What did you eat for breakfast today (or yesterday)?no breakfast yesterday- Typical breakfast-oatmeal, cereal or eggs What did you eat for lunch today (or yesterday)? Sandwich  Typical lunch-yogurt What did you eat for diner today (or yesterday)?Beef tips,mashed potatoes and roasted broccoli and cauliflower -Typical dinner-spaghetti-Typical snacks:applesauce or yogurt Has been working on diet and has lost 20 lbs - is eating healthier What beverages do you drink besides water:Coke with vanilla syrup, milk, muscadine juice  Answer theses question about you: Can you perform most household chores?yes Do you find it hard to follow a conversation in a noisy room?no Do you find it hard to understand a speaker at church or in a meeting?no Do you often ask people to speak up or repeat themselves?occasionally - does not want hearing aids - feels does not need them at this point Do you experience ringing in your ears?yes - saw pcp about  this, now resoved Do you have difficulty understanding a soft or whispered voice?occasionally Do you feel that you have a problem with memory?occasionally - has a lot of stress at home as is caring for loved one with dementia. Do you often misplace items?no Do you balance your checkbook and or bank acounts?yes Do you feel safe at home?yes Last dentist visit? last week, awaiting work up for dentures Do you need assistance with any of the following:  Driving?yes due to macular degeneration  Feeding yourself?yes  Getting from bed to chair?no  Getting to the [place] or showering?no  Dressing yourself?no  Managing money?no  Climbing a flight of stairs?yes  Preparing meals?no  Do you have Advanced Directives in place (Living Will, Healthcare Power or Attorney)? yes   Last eye Exam and location?May 2023 - sees Rodena Piety   Do you currently use prescribed or non-prescribed narcotic or opioid pain medications?no  Do you have a history or close family history of breast, ovarian, tubal or peritoneal cancer or a family member with BRCA 1/2 (breast cancer susceptibility 1 and 2) gene mutations? no Nurse/Assistant Credentials/time stamp:J Kammy Klett,CMA    ----------------------------------------------------------------------------------------------------------------------------------------------------------------------------------------------------------------------   MEDICARE ANNUAL PREVENTIVE VISIT WITH PROVIDER: (Welcome to [employer], initial annual wellness or annual wellness exam)  Virtual Visit via Video Note  I connected with [name] on 02/10/23 by  a video enabled telemedicine application and verified that I am speaking with the correct person using two identifiers.  Location patient: home Location provider:work or home office Persons participating in the virtual visit: patient, provider  Concerns and/or follow up today: She will be seeing PCP this Thursday as had  fall 1.5 weeks ago in bathroom when had norovirus. Today doing ok - able to get around, no significant HA, no fever, no weakness or numbness. But has had poor concentration - has had a lot going on. Walks with a walker.    See HM section in Epic for other details of completed HM.    ROS: negative for report of fevers, unintentional weight loss, vision changes, vision loss, hearing loss or change, chest pain, sob, hemoptysis, melena, hematochezia, hematuria,  bleeding or bruising, loc, thoughts of suicide or self harm, memory loss  Patient-completed extensive health risk assessment - reviewed and discussed with the patient: See Health Risk Assessment completed with patient prior to the visit either above or in recent phone note. This was reviewed in detailed with the patient today and appropriate recommendations, orders and referrals were placed as needed per Summary below and patient instructions.   Review of Medical History: -PMH, PSH, Family History and current specialty and care providers reviewed and updated and listed below   Patient Care Team: Dorothyann Peng, NP as PCP - General (Family Medicine) Viona Gilmore, [facility] (Inactive) as Pharmacist (Pharmacist)   Past Medical History:  Diagnosis Date   Anxiety    Arthritis    Chronic back pain    Chronic kidney disease 2019   ARF while in hospital for PLIF   Depression    GERD (gastroesophageal reflux disease)    Hyperlipidemia    Hypertension    Hypothyroidism    Macular degeneration of both eyes    not age related   Osteopenia    PONV (postoperative nausea and vomiting)    also is slow to wake up   Prediabetes     Past Surgical History:  Procedure Laterality Date   ABDOMINAL HYSTERECTOMY     total   ANTERIOR CERVICAL DISCECTOMY     APPENDECTOMY     BACK SURGERY     PLIF   BUNIONECTOMY Right    CHOLECYSTECTOMY     COLONOSCOPY     HAND SURGERY Left    thumb joint, carpal tunnel and cyst   HAND SURGERY Right     thumb and carpal tunnel   POSTERIOR CERVICAL FUSION/FORAMINOTOMY      Social History   Socioeconomic History   Marital status: Married    Spouse name: Not on file   Number of children: Not on file   Years of education: Not on file   Highest education level: Not on file  Occupational History   Not on file  Tobacco Use   Smoking status: Never   Smokeless tobacco: Never  Vaping Use   Vaping Use: Never used  Substance and Sexual Activity   Alcohol use: Never   Drug use: Never   Sexual activity: Not on file  Other Topics Concern   Not on file  Social History Narrative   Not on file   Social Determinants of Health   Financial Resource Strain: Low Risk  (06/23/2022)   Overall Financial Resource Strain (CARDIA)    Difficulty of Paying Living Expenses: Not hard at all  Food Insecurity: No Food Insecurity (02/03/2022)   Hunger Vital Sign    Worried About Running Out of Food in the Last Year: Never true    Ran Out of Food in the Last Year: Never true  Transportation Needs: No Transportation Needs (06/23/2022)   PRAPARE - Hydrologist (Medical): No    Lack of Transportation (Non-Medical): No  Physical Activity: Inactive (02/03/2022)   Exercise Vital Sign    Days of Exercise per Week: 0 days    Minutes of Exercise per Session: 0 min  Stress: No Stress Concern Present (02/03/2022)   [place]    Feeling of Stress : Not at all  Social Connections: Not on file  Intimate Partner Violence: Not on file    Family History  Problem Relation Age of Onset   Breast cancer Neg Hx     Current Outpatient Medications on File Prior to Visit  Medication Sig Dispense Refill   acetaminophen (TYLENOL) 650 MG CR tablet Take 650 mg by mouth every 8 (eight) hours as needed for pain.     ALPRAZolam (XANAX) 0.5 MG tablet Take 0.5 tablets (0.25 mg total) by mouth at bedtime as needed for sleep. 30 tablet 2    Bacillus Coagulans-Inulin (PROBIOTIC) 1-250 BILLION-MG CAPS      cyclobenzaprine (FLEXERIL) 10 MG tablet Take 1 tablet (10 mg total) by mouth 3 (three) times daily as needed for muscle spasms. 30 tablet 0   DULoxetine (CYMBALTA) 30 MG capsule Take 1 capsule (30 mg total) by mouth at bedtime. (Patient taking differently: Take 30 mg by mouth daily.) 90 capsule 1   fluticasone (FLONASE) 50 MCG/ACT nasal spray Place 2 sprays into both nostrils daily. 16 g 6   lansoprazole (PREVACID) 15 MG capsule TAKE 1 CAPSULE BY MOUTH DAILY AT 12 NOON 90 capsule 0   levothyroxine (SYNTHROID) 75 MCG tablet Take 1 tablet (75 mcg total) by mouth daily before breakfast. 90 tablet 1   lisinopril (ZESTRIL) 10 MG tablet Take 10 mg by mouth daily.     Multiple Vitamin (MULTIVITAMIN WITH MINERALS) TABS tablet Take 1 tablet by mouth daily.     ondansetron (ZOFRAN) 4 MG tablet Take 1 tablet (4 mg total) by mouth every 8 (eight) hours as needed for nausea or vomiting. 20 tablet 2   OZEMPIC, 0.25 OR 0.5 MG/DOSE, 2 MG/3ML SOPN DIAL AND INJECT UNDER THE SKIN 0.5 MG WEEKLY 3 mL 2   simvastatin (ZOCOR) 5 MG tablet Take 1 tablet (5 mg total) by mouth daily. 90 tablet 3   tiZANidine (ZANAFLEX) 2 MG tablet Take 2 mg by mouth every 6 (six) hours as needed for muscle spasms.     traMADol (ULTRAM) 50 MG tablet TAKE 1 TABLET BY MOUTH TWICE A DAY AS NEEDED 60 tablet 2   No current facility-administered medications on file prior to visit.    Allergies  Allergen Reactions   Celecoxib     Other reaction(s): itching   Citalopram Hydrobromide     Other reaction(s): nausea   Codeine Nausea Only and Other (See Comments)    Severe headache   Latex Itching    Other reaction(s): itching----GLOVES   Meperidine Hcl     Other reaction(s): itching   Morphine And Related Nausea And Vomiting    And headaches   Tape Itching   Trazodone Hcl     Other reaction(s): nightmares       Physical Exam Vitals:   02/10/23 0925  BP: 135/68  Temp:  100 F (37.8 C)   Estimated body mass index is 34.18 kg/m as calculated from the following:   Height as of 10/23/22: 5' (1.524 m).   Weight as of 10/23/22: 175 lb (79.4 kg).  EKG (optional): deferred due to virtual visit  GENERAL: alert, oriented, no acute distress detected, full vision exam deferred due  to pandemic and/or virtual encounter  HEENT: atraumatic, conjunttiva clear, no obvious abnormalities on inspection of external nose and ears  NECK: normal movements of the head and neck  LUNGS: on inspection no signs of respiratory distress, breathing rate appears normal, no obvious gross SOB, gasping or wheezing  CV: no obvious cyanosis  MS: moves all visible extremities without noticeable abnormality  PSYCH/NEURO: pleasant and cooperative, no obvious depression or anxiety, speech and thought processing grossly intact, Cognitive function grossly intact  Flowsheet Row Video Visit from 02/10/2023 in [facility]  PHQ-9 Total Score 3      Denies depression - reports is usually very positive. Just has some fatigue she thinks is normal.      02/10/2023    9:29 AM 04/22/2022   11:15 AM 02/03/2022    9:54 AM  Depression screen PHQ 2/9  Decreased Interest 0 0 0  Down, Depressed, Hopeless 0 0 0  PHQ - 2 Score 0 0 0  Altered sleeping 0 0   Tired, decreased energy 3 1   Change in appetite 0 0   Feeling bad or failure about yourself  0 0   Trouble concentrating 0 0   Moving slowly or fidgety/restless 0 0   Suicidal thoughts 0 0   PHQ-9 Score 3 1   Difficult doing work/chores  Not difficult at all        03/06/2021    8:00 AM 05/25/2021    4:24 PM 02/03/2022    9:53 AM 04/22/2022   11:14 AM 02/10/2023    9:26 AM  Fall Risk  Falls in the past year?   0 1 1  Was there an injury with Fall?    1 1  Fall Risk Category Calculator    2 3  Fall Risk Category (Retired)    Moderate   (RETIRED) Patient Fall Risk Level Moderate fall risk Low fall risk Low fall  risk    Patient at Risk for Falls Due to   Medication side effect History of fall(s) History of fall(s)  Fall risk Follow up   Falls evaluation completed;Education provided;Falls prevention discussed Falls evaluation completed Falls evaluation completed     SUMMARY AND PLAN:  Encounter for Medicare annual wellness exam    Discussed applicable health maintenance/preventive health measures and advised and referred or ordered per patient preferences:  Health Maintenance  Topic Date Due   COVID-19 Vaccine (4 - 2023-24 season) 02/26/2023 (Originally 07/18/2022), considering, advised can get at pharmacy.    COLONOSCOPY (Pts 45-25yrs Insurance coverage will need to be confirmed)  She declined, reports has discussed with PCP but has too much going on, agrees to call if changes mind. Discussed options - colonoscopy, cologuard, fobt.   Medicare Annual Wellness (AWV)  02/10/2024   MAMMOGRAM  07/17/2024   DTaP/Tdap/Td (3 - Td or Tdap) 02/03/2027   Pneumonia Vaccine 60+ Years old  Completed   INFLUENZA VACCINE  Completed   DEXA SCAN  Completed   Hepatitis C Screening  Completed   Zoster Vaccines- Shingrix  Completed   HPV VACCINES  Aged Out     Education and counseling on the following was provided based on the above review of health and a plan/checklist for the patient, along with additional information discussed, was provided for the patient in the patient instructions :   -Provided counseling and plan for difficulty hearing discussed/referral to audiology - she does not feel needs eval/hearing aids at this point.  -Provided counseling  and plan for increased risk of falling if applicable per above screening. She is seeing PCP for this in a few days, using walker. We reviewed safe balance exercise at length and demonstrated. Also discussed PT and she plans to consider - right now has a lot going on. -Advised and counseled on maintaining healthy weight and healthy lifestyle - including the  importance of a healthy diet, regular physical activity, social connections and stress management. -Advised and counseled on a whole foods based healthy diet and regular exercise: discussed a heart healthy whole foods based diet at length. A summary of a healthy diet was provided in the Patient Instructions. - Recommended regular exercise and discussed options within the community. However advised caution, slowly adding chair exercises, building from there.  -Advise yearly dental visits at minimum and regular eye exams  Follow up: see patient instructions     Patient Instructions  I really enjoyed getting to talk with you today! I am available on Tuesdays and Thursdays for virtual visits if you have any questions or concerns, or if I can be of any further assistance.   CHECKLIST FROM ANNUAL WELLNESS VISIT:  -Follow up (please call to schedule if not scheduled after visit): as scheduled with Dorothyann Peng   -yearly for annual wellness visit with primary care office  Here is a list of your preventive care/health maintenance measures and the plan for each if any are due:  Health Maintenance  Topic Date Due   COVID-19 Vaccine (4 - 2023-24 season) 02/26/2023 (Originally 07/18/2022)   COLONOSCOPY (Pts 45-33yrs Insurance coverage will need to be confirmed)  Due, please call our office if you change your mind and wish to do.    Medicare Annual Wellness (AWV)  02/10/2024   MAMMOGRAM  07/17/2024   DTaP/Tdap/Td (3 - Td or Tdap) 02/03/2027   Pneumonia Vaccine 67+ Years old  Completed   INFLUENZA VACCINE  Completed   DEXA SCAN  Completed   Hepatitis C Screening  Completed   Zoster Vaccines- Shingrix  Completed   HPV VACCINES  Aged Out    -See a dentist at least yearly  -Get your eyes checked and then per your eye specialist's recommendations  -Other issues addressed today: -balance -chair exercise -decreasing sweets/sugar  -I have included below further information regarding a healthy  whole foods based diet, physical activity guidelines for adults, stress management and opportunities for social connections. I hope you find this information useful.   -----------------------------------------------------------------------------------------------------------------------------------------------------------------------------------------------------------------------------------------------------------  NUTRITION: -eat real food: lots of colorful vegetables (half the plate) and fruits -5-7 servings of vegetables and fruits per day (fresh or steamed is best), exp. 2 servings of vegetables with lunch and dinner and 2 servings of fruit per day. Berries and greens such as kale and collards are great choices.  -consume on a regular basis: whole grains (make sure first ingredient on label contains the word "whole"), fresh fruits, fish, nuts, seeds, healthy oils (such as olive oil, avocado oil, grape seed oil) -may eat small amounts of dairy and lean meat on occasion, but avoid processed meats such as ham, bacon, lunch meat, etc. -drink water -try to avoid fast food and pre-packaged foods, processed meat -most experts advise limiting sodium to < 2300mg  per day, should limit further is any chronic conditions such as high blood pressure, heart disease, diabetes, etc. The American Heart Association advised that < 1500mg  is is ideal -try to avoid foods that contain any ingredients with names you do not recognize  -try to avoid  sugar/sweets (except for the natural sugar that occurs in fresh fruit) -try to avoid sweet drinks -try to avoid white rice, white bread, pasta (unless whole grain), white or yellow potatoes  EXERCISE GUIDELINES FOR ADULTS: -if you wish to increase your physical activity, do so gradually and with the approval of your doctor -STOP and seek medical care immediately if you have any chest pain, chest discomfort or trouble breathing when starting or increasing exercise  -move  and stretch your body, legs, feet and arms when sitting for long periods -Physical activity guidelines for optimal health in adults: -least 150 minutes per week of aerobic exercise (can talk, but not sing) once approved by your doctor, 20-30 minutes of sustained activity or two 10 minute episodes of sustained activity every day.  -resistance training at least 2 days per week if approved by your doctor -balance exercises 3+ days per week:   Stand somewhere where you have something sturdy to hold onto if you lose balance.    1) lift up on toes, start with 5x per day and work up to 20x   2) stand and lift on leg straight out to the side so that foot is a few inches of the floor, start with 5x each side and work up to 20x each side   3) stand on one foot, start with 5 seconds each side and work up to 20 seconds on each side  If you need ideas or help with getting more active:  -Silver sneakers https://tools.silversneakers.com  -Walk with a Doc: http://stephens-thompson.biz/  -try to include resistance (weight lifting/strength building) and balance exercises twice per week: or the following link for ideas: ChessContest.fr  UpdateClothing.com.cy  STRESS MANAGEMENT: -can try meditating, or just sitting quietly with deep breathing while intentionally relaxing all parts of your body for 5 minutes daily -if you need further help with stress, anxiety or depression please follow up with your primary doctor or contact the wonderful folks at [facility]: [place]: -options in [place] if you wish to engage in more social and exercise related activities:  -Silver sneakers https://tools.silversneakers.com  -Walk with a Doc: http://stephens-thompson.biz/  -Check out the [place] 50+ section on the [place] [employer] (hiking clubs, book clubs, cards and  games, chess, exercise classes, aquatic classes and much more) - see the website for details: https://www.[place].gov/departments/parks-recreation/active-adults50  -YouTube has lots of exercise videos for different ages and abilities as well  -[place] (a variety of indoor and outdoor inperson activities for adults). 775-164-8082. [address]  -Virtual Online Classes (a variety of topics): see seniorplanet.org or call [phone]  -consider volunteering at a school, hospice center, church, senior center or elsewhere           Lucretia Kern, DO

## 2023-02-10 NOTE — Telephone Encounter (Signed)
Contacted [name] to schedule their annual wellness visit. Appointment made for 02/10/23.  [name] AWV direct phone # [phone]

## 2023-02-10 NOTE — Patient Instructions (Addendum)
I really enjoyed getting to talk with you today! I am available on Tuesdays and Thursdays for virtual visits if you have any questions or concerns, or if I can be of any further assistance.   CHECKLIST FROM ANNUAL WELLNESS VISIT:  -Follow up (please call to schedule if not scheduled after visit): as scheduled with Dorothyann Peng   -yearly for annual wellness visit with primary care office  Here is a list of your preventive care/health maintenance measures and the plan for each if any are due:  Health Maintenance  Topic Date Due   COVID-19 Vaccine (4 - 2023-24 season) 02/26/2023 (Originally 07/18/2022)   COLONOSCOPY (Pts 45-108yrs Insurance coverage will need to be confirmed)  Due, please call our office if you change your mind and wish to do.    Medicare Annual Wellness (AWV)  02/10/2024   MAMMOGRAM  07/17/2024   DTaP/Tdap/Td (3 - Td or Tdap) 02/03/2027   Pneumonia Vaccine 9+ Years old  Completed   INFLUENZA VACCINE  Completed   DEXA SCAN  Completed   Hepatitis C Screening  Completed   Zoster Vaccines- Shingrix  Completed   HPV VACCINES  Aged Out    -See a dentist at least yearly  -Get your eyes checked and then per your eye specialist's recommendations  -Other issues addressed today: -balance -chair exercise -decreasing sweets/sugar  -I have included below further information regarding a healthy whole foods based diet, physical activity guidelines for adults, stress management and opportunities for social connections. I hope you find this information useful.   -----------------------------------------------------------------------------------------------------------------------------------------------------------------------------------------------------------------------------------------------------------  NUTRITION: -eat real food: lots of colorful vegetables (half the plate) and fruits -5-7 servings of vegetables and fruits per day (fresh or steamed is best), exp. 2 servings  of vegetables with lunch and dinner and 2 servings of fruit per day. Berries and greens such as kale and collards are great choices.  -consume on a regular basis: whole grains (make sure first ingredient on label contains the word "whole"), fresh fruits, fish, nuts, seeds, healthy oils (such as olive oil, avocado oil, grape seed oil) -may eat small amounts of dairy and lean meat on occasion, but avoid processed meats such as ham, bacon, lunch meat, etc. -drink water -try to avoid fast food and pre-packaged foods, processed meat -most experts advise limiting sodium to < 2300mg  per day, should limit further is any chronic conditions such as high blood pressure, heart disease, diabetes, etc. The American Heart Association advised that < 1500mg  is is ideal -try to avoid foods that contain any ingredients with names you do not recognize  -try to avoid sugar/sweets (except for the natural sugar that occurs in fresh fruit) -try to avoid sweet drinks -try to avoid white rice, white bread, pasta (unless whole grain), white or yellow potatoes  EXERCISE GUIDELINES FOR ADULTS: -if you wish to increase your physical activity, do so gradually and with the approval of your doctor -STOP and seek medical care immediately if you have any chest pain, chest discomfort or trouble breathing when starting or increasing exercise  -move and stretch your body, legs, feet and arms when sitting for long periods -Physical activity guidelines for optimal health in adults: -least 150 minutes per week of aerobic exercise (can talk, but not sing) once approved by your doctor, 20-30 minutes of sustained activity or two 10 minute episodes of sustained activity every day.  -resistance training at least 2 days per week if approved by your doctor -balance exercises 3+ days per week:   Stand  somewhere where you have something sturdy to hold onto if you lose balance.    1) lift up on toes, start with 5x per day and work up to 20x   2)  stand and lift on leg straight out to the side so that foot is a few inches of the floor, start with 5x each side and work up to 20x each side   3) stand on one foot, start with 5 seconds each side and work up to 20 seconds on each side  If you need ideas or help with getting more active:  -Silver sneakers https://tools.silversneakers.com  -Walk with a Doc: http://stephens-thompson.biz/  -try to include resistance (weight lifting/strength building) and balance exercises twice per week: or the following link for ideas: ChessContest.fr  UpdateClothing.com.cy  STRESS MANAGEMENT: -can try meditating, or just sitting quietly with deep breathing while intentionally relaxing all parts of your body for 5 minutes daily -if you need further help with stress, anxiety or depression please follow up with your primary doctor or contact the wonderful folks at [place]: [place]: -options in [place] if you wish to engage in more social and exercise related activities:  -Silver sneakers https://tools.silversneakers.com  -Walk with a Doc: http://stephens-thompson.biz/  -Check out the [place] 50+ section on the [place] [employer] (hiking clubs, book clubs, cards and games, chess, exercise classes, aquatic classes and much more) - see the website for details: https://www.[place].gov/departments/parks-recreation/active-adults50  -YouTube has lots of exercise videos for different ages and abilities as well  -[place] (a variety of indoor and outdoor inperson activities for adults). [phone]. [address]  -Virtual Online Classes (a variety of topics): see seniorplanet.org or call [phone]  -consider volunteering at a school, hospice center, church, senior center or elsewhere

## 2023-02-12 ENCOUNTER — Telehealth: Payer: Self-pay

## 2023-02-12 ENCOUNTER — Encounter: Payer: Self-pay | Admitting: Adult Health

## 2023-02-12 ENCOUNTER — Ambulatory Visit (INDEPENDENT_AMBULATORY_CARE_PROVIDER_SITE_OTHER): Payer: Medicare Other | Admitting: Adult Health

## 2023-02-12 VITALS — BP 110/70 | HR 94 | Temp 97.8°F | Ht 60.0 in | Wt 174.0 lb

## 2023-02-12 DIAGNOSIS — S060X0A Concussion without loss of consciousness, initial encounter: Secondary | ICD-10-CM

## 2023-02-12 DIAGNOSIS — S060X0D Concussion without loss of consciousness, subsequent encounter: Secondary | ICD-10-CM

## 2023-02-12 NOTE — Progress Notes (Signed)
Subjective:    Patient ID: [name], female    DOB: 1946/01/27, 77 y.o.   MRN: [id number]  HPI 77 year old female who  has a past medical history of Anxiety, Arthritis, Chronic back pain, Chronic kidney disease (2019), Depression, GERD (gastroesophageal reflux disease), Hyperlipidemia, Hypertension, Hypothyroidism, Macular degeneration of both eyes, Osteopenia, PONV (postoperative nausea and vomiting), and Prediabetes.  She reports for concerns of a concussion.  She reports that she had a syncopal episode one week ago in the setting of norovirus and dehydration,  where she hit the left side of her head on the edge of the walk in shower. She had bruising around her ear.   She has been dealing with fatigue, nausea, trouble concentrating and staying focused, she has blurred vision ( h/o progressive macular degeneration) but does not feel like blurred vision is worse after fall. .  She feels as though this is affecting her daily routine of getting things done  She had a fall back in November which she hit her head on a concrete floor, she did not want any imaging at this time but likely had a concussion.   She is not on any blood thinners    Review of Systems See HPI   Past Medical History:  Diagnosis Date   Anxiety    Arthritis    Chronic back pain    Chronic kidney disease 2019   ARF while in hospital for PLIF   Depression    GERD (gastroesophageal reflux disease)    Hyperlipidemia    Hypertension    Hypothyroidism    Macular degeneration of both eyes    not age related   Osteopenia    PONV (postoperative nausea and vomiting)    also is slow to wake up   Prediabetes     Social History   Socioeconomic History   Marital status: Married    Spouse name: Not on file   Number of children: Not on file   Years of education: Not on file   Highest education level: Not on file  Occupational History   Not on file  Tobacco Use   Smoking status: Never   Smokeless tobacco: Never   Vaping Use   Vaping Use: Never used  Substance and Sexual Activity   Alcohol use: Never   Drug use: Never   Sexual activity: Not on file  Other Topics Concern   Not on file  Social History Narrative   Not on file   Social Determinants of Health   Financial Resource Strain: Low Risk  (06/23/2022)   Overall Financial Resource Strain (CARDIA)    Difficulty of Paying Living Expenses: Not hard at all  Food Insecurity: No Food Insecurity (02/03/2022)   Hunger Vital Sign    Worried About Running Out of Food in the Last Year: Never true    Ran Out of Food in the Last Year: Never true  Transportation Needs: No Transportation Needs (06/23/2022)   PRAPARE - Hydrologist (Medical): No    Lack of Transportation (Non-Medical): No  Physical Activity: Inactive (02/03/2022)   Exercise Vital Sign    Days of Exercise per Week: 0 days    Minutes of Exercise per Session: 0 min  Stress: No Stress Concern Present (02/03/2022)   [place]    Feeling of Stress : Not at all  Social Connections: Not on file  Intimate Partner Violence: Not on file  Past Surgical History:  Procedure Laterality Date   ABDOMINAL HYSTERECTOMY     total   ANTERIOR CERVICAL DISCECTOMY     APPENDECTOMY     BACK SURGERY     PLIF   BUNIONECTOMY Right    CHOLECYSTECTOMY     COLONOSCOPY     HAND SURGERY Left    thumb joint, carpal tunnel and cyst   HAND SURGERY Right    thumb and carpal tunnel   POSTERIOR CERVICAL FUSION/FORAMINOTOMY      Family History  Problem Relation Age of Onset   Breast cancer Neg Hx     Allergies  Allergen Reactions   Celecoxib     Other reaction(s): itching   Citalopram Hydrobromide     Other reaction(s): nausea   Codeine Nausea Only and Other (See Comments)    Severe headache   Latex Itching    Other reaction(s): itching----GLOVES   Meperidine Hcl     Other reaction(s): itching    Morphine And Related Nausea And Vomiting    And headaches   Tape Itching   Trazodone Hcl     Other reaction(s): nightmares    Current Outpatient Medications on File Prior to Visit  Medication Sig Dispense Refill   acetaminophen (TYLENOL) 650 MG CR tablet Take 650 mg by mouth every 8 (eight) hours as needed for pain.     ALPRAZolam (XANAX) 0.5 MG tablet Take 0.5 tablets (0.25 mg total) by mouth at bedtime as needed for sleep. 30 tablet 2   Bacillus Coagulans-Inulin (PROBIOTIC) 1-250 BILLION-MG CAPS      cyclobenzaprine (FLEXERIL) 10 MG tablet Take 1 tablet (10 mg total) by mouth 3 (three) times daily as needed for muscle spasms. 30 tablet 0   DULoxetine (CYMBALTA) 30 MG capsule Take 1 capsule (30 mg total) by mouth at bedtime. (Patient taking differently: Take 30 mg by mouth daily.) 90 capsule 1   fluticasone (FLONASE) 50 MCG/ACT nasal spray Place 2 sprays into both nostrils daily. 16 g 6   lansoprazole (PREVACID) 15 MG capsule TAKE 1 CAPSULE BY MOUTH DAILY AT 12 NOON 90 capsule 0   levothyroxine (SYNTHROID) 75 MCG tablet Take 1 tablet (75 mcg total) by mouth daily before breakfast. 90 tablet 1   lisinopril (ZESTRIL) 10 MG tablet Take 10 mg by mouth daily.     Multiple Vitamin (MULTIVITAMIN WITH MINERALS) TABS tablet Take 1 tablet by mouth daily.     ondansetron (ZOFRAN) 4 MG tablet Take 1 tablet (4 mg total) by mouth every 8 (eight) hours as needed for nausea or vomiting. 20 tablet 2   OZEMPIC, 0.25 OR 0.5 MG/DOSE, 2 MG/3ML SOPN DIAL AND INJECT UNDER THE SKIN 0.5 MG WEEKLY 3 mL 2   simvastatin (ZOCOR) 5 MG tablet Take 1 tablet (5 mg total) by mouth daily. 90 tablet 3   tiZANidine (ZANAFLEX) 2 MG tablet Take 2 mg by mouth every 6 (six) hours as needed for muscle spasms.     traMADol (ULTRAM) 50 MG tablet TAKE 1 TABLET BY MOUTH TWICE A DAY AS NEEDED 60 tablet 2   No current facility-administered medications on file prior to visit.    BP 110/70   Pulse 94   Temp 97.8 F (36.6 C) (Oral)    Ht 5' (1.524 m)   Wt 174 lb (78.9 kg)   SpO2 93%   BMI 33.98 kg/m       Objective:   Physical Exam Vitals and nursing note reviewed.  Constitutional:  Appearance: Normal appearance.  HENT:     Right Ear: Hearing, tympanic membrane and ear canal normal.     Left Ear: Hearing, tympanic membrane, ear canal and external ear normal.  Eyes:     Extraocular Movements:     Right eye: Nystagmus (horizontal) present.     Left eye: Nystagmus (horizontal) present.  Cardiovascular:     Rate and Rhythm: Normal rate and regular rhythm.     Pulses: Normal pulses.     Heart sounds: Normal heart sounds.  Pulmonary:     Effort: Pulmonary effort is normal.     Breath sounds: Normal breath sounds.  Musculoskeletal:        General: Normal range of motion.  Skin:    General: Skin is warm and dry.  Neurological:     General: No focal deficit present.     Mental Status: She is alert and oriented to person, place, and time.  Psychiatric:        Mood and Affect: Mood normal.        Behavior: Behavior normal.        Thought Content: Thought content normal.        Judgment: Judgment normal.        Assessment & Plan:  1. Concussion without loss of consciousness, subsequent encounter -  Likely has another concussion. Due to symptoms will order CT head. Also refer to sports medicine concussion clinic. Advised low stimulation and rest. Can take already prescribed zofran for nausea.  - CT HEAD WO CONTRAST (5MM); Future - Ambulatory referral to Sports Medicine - Falls precautions reviewed   Time spent with patient today was 37  minutes which consisted of chart review, discussing concussions,  work up, treatment answering questions and documentation.

## 2023-02-12 NOTE — Telephone Encounter (Signed)
Received notification from Cover my Meds for Ozempic. PA was started but will automatically be declined due to pt not having Type 2 diabetes. Will inform pt and provider of update.

## 2023-02-19 ENCOUNTER — Other Ambulatory Visit: Payer: Self-pay | Admitting: Adult Health

## 2023-02-23 ENCOUNTER — Encounter: Payer: Self-pay | Admitting: Adult Health

## 2023-02-23 DIAGNOSIS — Z1211 Encounter for screening for malignant neoplasm of colon: Secondary | ICD-10-CM

## 2023-02-24 ENCOUNTER — Telehealth: Payer: Self-pay

## 2023-02-24 NOTE — Progress Notes (Signed)
Care Management & Coordination Services Pharmacy Team  Reason for Encounter: Appointment Reminder  Contacted patient to confirm telephone appointment with [name], PharmD on 02/25/2023 at 1:30. Unsuccessful outreach. Left voicemail for patient to return call.  Do you have any problems getting your medications?  If yes what types of problems are you experiencing?   What is your top health concern you would like to discuss at your upcoming visit?   Have you seen any other providers since your last visit with PCP?   Care Gaps: AWV - completed 02/10/2023 Last BP - 110/70 on 02/12/2023 Covid - postponed Colonoscopy - postponed   Star Rating Drugs: Simvastatin 5 mg  - last filled 01/08/2023 90 DS at [facility] Lisinopril 20 mg - last filled 10/20/2022 30 DS at [facility] (dose decreased to 10 mg daily and she has been cutting them in half  [name]  Clinical Pharmacist Assistant [phone]

## 2023-03-03 ENCOUNTER — Ambulatory Visit: Payer: Medicare Other | Admitting: Family Medicine

## 2023-03-03 VITALS — BP 118/76 | HR 102 | Ht 60.0 in | Wt 173.0 lb

## 2023-03-03 DIAGNOSIS — S060X0A Concussion without loss of consciousness, initial encounter: Secondary | ICD-10-CM | POA: Diagnosis not present

## 2023-03-03 MED ORDER — AMPHETAMINE-DEXTROAMPHETAMINE 10 MG PO TABS: 10.0000 mg | ORAL_TABLET | Freq: Every day | ORAL | 0 refills | Status: DC

## 2023-03-03 NOTE — Progress Notes (Signed)
Subjective:   I, Philbert Riser, PhD, LAT, ATC acting as a scribe for [name], MD.  Chief Complaint: [name],  is a 77 y.o. female who presents for initial evaluation of a head injury. She reports that she had a syncopal episode, mid-March, in the setting of Norovirus and dehydration,  where she hit the left side of her head on the edge of the walk in shower. She had bruising around her ear. She c/o cont'd buzzing in her ears, fatigue, inability to focus/staying on task.  She has a hx of macular degeneration and back/neck surgeries.   When asked she thinks it is very likely that she has had ADHD her entire life bothersome as an adult.  Never been formally diagnosed.  She mentions that in the 80s or 90s when she was prescribed phentermine for her diet it helped her concentrate significantly.  Injury date : mid-March Visit #: 1  History of Present Illness:   Concussion Self-Reported Symptom Score Symptoms rated on a scale 1-6, in last 24 hours  Headache: 1   Pressure in head: 1 Neck pain: 1 Nausea or vomiting: 4 Dizziness: 3  Blurred vision: 5  Balance problems: 4 Sensitivity to light:  5 Sensitivity to noise: 2 Feeling slowed down: 4 Feeling like "in a fog": 4 "Don't feel right": 4 Difficulty concentrating: 4 Difficulty remembering: 3 Fatigue or low energy: 5 Confusion: 3 Drowsiness: 5 More emotional: 3 Irritability: 2 Sadness: 1 Nervous or anxious: 0 Trouble falling asleep: 4   Total # of Symptoms: 21/22 Total Symptom Score: 68/132  Tinnitus: Yes- but describe it as more of a "buzzing in her head"   Review of Systems: No fevers or chills    Review of History: Hypertension.  Macular degeneration  Objective:    Physical Examination Vitals:   03/03/23 1405  BP: 118/76  Pulse: (!) 102  SpO2: 96%    Neuro: Alert and oriented normal coordination gait Psych: Normal speech thought process and affect.    Assessment and Plan   77 y.o. female with  concussion after a fall. Dominant symptoms currently are concentration related.  After discussion I think it is likely that she probably has a coexisting diagnosis of adult ADHD.  This has worsened after her concussion which is typical.  Plan to try starting Adderall for ADHD/catch question concentration issues.  Will adjust the dose based on response.  Recheck in 2 weeks.  She also will follow-up with her optometrist ophthalmologist regarding her vision.  There may be some overlap between macular degeneration and concussion.   Recheck in 2 weeks.     Action/Discussion: Reviewed diagnosis, management options, expected outcomes, and the reasons for scheduled and emergent follow-up. Questions were adequately answered. Patient expressed verbal understanding and agreement with the following plan.     Patient Education: Reviewed with patient the risks (i.e, a repeat concussion, post-concussion syndrome, second-impact syndrome) of returning to play prior to complete resolution, and thoroughly reviewed the signs and symptoms of concussion.Reviewed need for complete resolution of all symptoms, with rest AND exertion, prior to return to play. Reviewed red flags for urgent medical evaluation: worsening symptoms, nausea/vomiting, intractable headache, musculoskeletal changes, focal neurological deficits. [facility]'s Concussion Care Plan, which clearly outlines the plans stated above, was given to patient.   Level of service: Total encounter time 30 minutes including face-to-face time with the patient and, reviewing past medical record, and charting on the date of service.        After  Visit Summary printed out and provided to patient as appropriate.  The above documentation has been reviewed and is accurate and complete [name]

## 2023-03-03 NOTE — Patient Instructions (Signed)
Thank you for coming in today.   Try adderall.   Recheck in about 2 weeks.   Let me know if this is not working.

## 2023-03-04 ENCOUNTER — Other Ambulatory Visit: Payer: Self-pay | Admitting: Adult Health

## 2023-03-09 ENCOUNTER — Encounter: Payer: Self-pay | Admitting: Family Medicine

## 2023-03-17 ENCOUNTER — Ambulatory Visit: Payer: Medicare Other | Admitting: Family Medicine

## 2023-03-17 VITALS — BP 120/86 | HR 99 | Ht 60.0 in | Wt 176.0 lb

## 2023-03-17 DIAGNOSIS — S060X0D Concussion without loss of consciousness, subsequent encounter: Secondary | ICD-10-CM | POA: Diagnosis not present

## 2023-03-17 MED ORDER — AMPHETAMINE-DEXTROAMPHETAMINE 15 MG PO TABS: 15.0000 mg | ORAL_TABLET | Freq: Every day | ORAL | 0 refills | Status: DC

## 2023-03-17 NOTE — Patient Instructions (Addendum)
Thank you for coming in today.   Plan to increase adderall to 15mg  daily.   Let me know if this is a problem.   Keep your appointment with the eye doctor.   Ok to use dark glasses even inside for light sensitivity.   Recheck in 1 month.

## 2023-03-17 NOTE — Progress Notes (Unsigned)
Subjective:   I, Philbert Riser, PhD, LAT, ATC acting as a scribe for [name], MD.  Chief Complaint: [name],  is a 77 y.o. female who presents for 2-wk f/u concussion. She has a hx of macular degeneration and back/neck surgeries. She reports that she had a syncopal episode, mid-March, in the setting of Norovirus and dehydration, where she hit the left side of her head on the edge of the walk in shower. Pt was last seen by Dr. Denyse Amass on 03/03/23 and was prescribed Adderall and advised to f/u w/ her ophthalmologist.  Today, pt reports she has been feeling much better. More issues in the mornings. Her energy is back and she is accomplishing so much more. Pt sometimes the Adderral will wear off and then she will have tiredness and lack of focus. She wants to ask some question about the ER rx option. She has a visit for her eye scheduled for the end of May.   Injury date : mid-March Visit #: 2  History of Present Illness:   Concussion Self-Reported Symptom Score Symptoms rated on a scale 1-6, in last 24 hours  Headache: 0   Pressure in head: 2 Neck pain: 2 Nausea or vomiting: 2 Dizziness: 0  Blurred vision: 5  Balance problems: 2 Sensitivity to light:  5 Sensitivity to noise: 0 Feeling slowed down: 1 Feeling like "in a fog": 1 "Don't feel right": 1 Difficulty concentrating: 0 Difficulty remembering: 1 Fatigue or low energy: 1 Confusion: 1 Drowsiness: 1 More emotional: 1 Irritability: 1 Sadness: 0 Nervous or anxious: 0 Trouble falling asleep: 0   Total # of Symptoms: 15//22 Total Symptom Score: 27/132/132  Previous Total # of Symptoms: 21/22 Previous Symptom Score: 68/132  Tinnitus: Yes- very last, last night and this morning  Review of Systems: No fevers or chills    Review of History: Hypertension.  Macular degeneration.  Objective:    Physical Examination Vitals:   03/17/23 1402  BP: 120/86  Pulse: 99  SpO2: 96%   MSK: Normal cervical motion Neuro:  Alert and oriented coordination and gait Psych: Normal speech thought process and affect.    Assessment and Plan   77 y.o. female with concussion.  Symptoms are significantly improving.  Her cognitive symptoms have significantly improved with Adderall.  She thinks the dose could be increased to tad and will go from 10 to 15 mg daily.  However this seems to be a clear benefit to her. Plan to continue Adderall.  Recheck in a month.  As for her photosensitivity she is seeing her eye doctors at the end of May.  Will check back after that and adjust as needed.       Action/Discussion: Reviewed diagnosis, management options, expected outcomes, and the reasons for scheduled and emergent follow-up. Questions were adequately answered. Patient expressed verbal understanding and agreement with the following plan.     Patient Education: Reviewed with patient the risks (i.e, a repeat concussion, post-concussion syndrome, second-impact syndrome) of returning to play prior to complete resolution, and thoroughly reviewed the signs and symptoms of concussion.Reviewed need for complete resolution of all symptoms, with rest AND exertion, prior to return to play. Reviewed red flags for urgent medical evaluation: worsening symptoms, nausea/vomiting, intractable headache, musculoskeletal changes, focal neurological deficits. [facility]'s Concussion Care Plan, which clearly outlines the plans stated above, was given to patient.   Level of service: Total encounter time 30 minutes including face-to-face time with the patient and, reviewing past medical record, and  charting on the date of service.        After Visit Summary printed out and provided to patient as appropriate.  The above documentation has been reviewed and is accurate and complete [name]

## 2023-03-30 NOTE — Progress Notes (Signed)
Care Management & Coordination Services Pharmacy Note  04/01/2023 Name:  [name] MRN:  [id number] DOB:  1946-07-03  Summary: BP at goal <140/90 LDL not at goal <100 (statin has since been started)  Recommendations/Changes made from today's visit: -Counseled to check BP once weekly and keep a log -Counseled on med adherence and need for updated labwork (CMP, Lipid) -Reminded patient to schedule annual visit with PCP  Follow up plan: General call in 1 month to ensure pcp f/u visit scheduled BP call in 3 months Pharmacist visit in Dec   Subjective: [name] is an 77 y.o. year old female who is a primary patient of [name], NP.  The care coordination team was consulted for assistance with disease management and care coordination needs.    Engaged with patient by telephone for follow up visit.  Recent office visits: 02/12/23 [name], NP - For concussion without loss of consciousness, no medication changes  02/10/23 Kriste Basque, DO - For AWV, no medication changes  10/23/2022 Kriste Basque DO - Patient was seen for Covid19. Started [employer] and Paxlovid.    10/22/2022 [name] NP - Patient was seen for acute non recurrent pansinusitis. Started Doxycycline and Flonase.    10/14/2022 [name] NP - Patient was seen for Injury of head, initial encounter and nausea. Started Ondansetron.   Recent consult visits: 03/17/23 [name], MD (Sports Medicine) - For concussion. INCREASE Adderall  03/03/23 [name], MD (Sports Medicine) - For concussion. START Adderall  Hospital visits: None in previous 6 months   Objective:  Lab Results  Component Value Date   CREATININE 0.96 04/22/2022   BUN 18 04/22/2022   GFR 58.46 (L) 04/22/2022   GFRNONAA >60 03/03/2021   GFRAA  03/20/2008    >60        The eGFR has been calculated using the MDRD equation. This calculation has not been validated in all clinical   NA 137 04/22/2022   K 5.1 04/22/2022    CALCIUM 10.0 04/22/2022   CO2 28 04/22/2022   GLUCOSE 95 04/22/2022    Lab Results  Component Value Date/Time   HGBA1C 6.0 04/22/2022 11:44 AM   GFR 58.46 (L) 04/22/2022 11:44 AM    Last diabetic Eye exam: No results found for: "HMDIABEYEEXA"  Last diabetic Foot exam: No results found for: "HMDIABFOOTEX"   Lab Results  Component Value Date   CHOL 202 (H) 04/22/2022   HDL 61.60 04/22/2022   LDLCALC 118 (H) 04/22/2022   TRIG 108.0 04/22/2022   CHOLHDL 3 04/22/2022       Latest Ref Rng & Units 04/22/2022   11:44 AM 03/03/2021    5:57 PM 03/20/2008   12:01 PM  Hepatic Function  Total Protein 6.0 - 8.3 g/dL 7.7  7.5  7.7   Albumin 3.5 - 5.2 g/dL 4.1  3.6  4.1   AST 0 - 37 U/L 25  26  29    ALT 0 - 35 U/L 18  25  25    Alk Phosphatase 39 - 117 U/L 87  85  85   Total Bilirubin 0.2 - 1.2 mg/dL 0.5  0.8  0.6     Lab Results  Component Value Date/Time   TSH 0.76 04/22/2022 11:44 AM       Latest Ref Rng & Units 04/22/2022   11:44 AM 03/04/2021    7:54 AM 03/03/2021    5:57 PM  CBC  WBC 4.0 - 10.5 K/uL 8.1  6.5  11.8  Hemoglobin 12.0 - 15.0 g/dL 46.9  62.9  52.8   Hematocrit 36.0 - 46.0 % 42.9  37.4  42.9   Platelets 150.0 - 400.0 K/uL 313.0  238  351     No results found for: "VD25OH", "VITAMINB12"  Clinical ASCVD: No  The 10-year ASCVD risk score (Arnett DK, et al., 2019) is: 18.8%   Values used to calculate the score:     Age: 77 years     Sex: Female     Is Non-Hispanic African American: No     Diabetic: No     Tobacco smoker: No     Systolic Blood Pressure: 120 mmHg     Is BP treated: Yes     HDL Cholesterol: 61.6 mg/dL     Total Cholesterol: 202 mg/dL       02/28/2439    1:02 AM 04/22/2022   11:15 AM 02/03/2022    9:54 AM  Depression screen PHQ 2/9  Decreased Interest 0 0 0  Down, Depressed, Hopeless 0 0 0  PHQ - 2 Score 0 0 0  Altered sleeping 0 0   Tired, decreased energy 3 1   Change in appetite 0 0   Feeling bad or failure about yourself  0 0   Trouble  concentrating 0 0   Moving slowly or fidgety/restless 0 0   Suicidal thoughts 0 0   PHQ-9 Score 3 1   Difficult doing work/chores  Not difficult at all      Social History   Tobacco Use  Smoking Status Never  Smokeless Tobacco Never   BP Readings from Last 3 Encounters:  03/17/23 120/86  03/03/23 118/76  02/12/23 110/70   Pulse Readings from Last 3 Encounters:  03/17/23 99  03/03/23 (!) 102  02/12/23 94   Wt Readings from Last 3 Encounters:  03/17/23 176 lb (79.8 kg)  03/03/23 173 lb (78.5 kg)  02/12/23 174 lb (78.9 kg)   BMI Readings from Last 3 Encounters:  03/17/23 34.37 kg/m  03/03/23 33.79 kg/m  02/12/23 33.98 kg/m    Allergies  Allergen Reactions   Celecoxib     Other reaction(s): itching   Citalopram Hydrobromide     Other reaction(s): nausea   Codeine Nausea Only and Other (See Comments)    Severe headache   Latex Itching    Other reaction(s): itching----GLOVES   Meperidine Hcl     Other reaction(s): itching   Morphine And Codeine Nausea And Vomiting    And headaches   Tape Itching   Trazodone Hcl     Other reaction(s): nightmares    Medications Reviewed Today     Reviewed by Sherrill Raring, RPH (Pharmacist) on 04/01/23 at 1604  Med List Status: <None>   Medication Order Taking? Sig Documenting Provider Last Dose Status Informant  acetaminophen (TYLENOL) 650 MG CR tablet [id number] No Take 650 mg by mouth every 8 (eight) hours as needed for pain. [provider] Taking Active Self  ALPRAZolam (XANAX) 0.5 MG tablet [id number] No Take 0.5 tablets (0.25 mg total) by mouth at bedtime as needed for sleep. Nafziger, Kandee Keen, NP Taking Active   amphetamine-dextroamphetamine (ADDERALL) 15 MG tablet [id number]  Take 1 tablet by mouth daily with breakfast. Rodolph Bong, MD  Active   Bacillus Coagulans-Inulin (PROBIOTIC) 1-250 BILLION-MG CAPS [id number] No  [provider] Taking Active   cyclobenzaprine (FLEXERIL) 10 MG tablet [id number] No  Take 1 tablet (10 mg total) by mouth 3 (three) times daily as needed  for muscle spasms.  Patient not taking: Reported on 03/17/2023   Sherryl Manges, NP Not Taking Active Self  DULoxetine (CYMBALTA) 30 MG capsule [id number]  TAKE ONE CAPSULE BY MOUTH AT BEDTIME Nafziger, Kandee Keen, NP  Active   fluticasone (FLONASE) 50 MCG/ACT nasal spray [id number] No Place 2 sprays into both nostrils daily. Nafziger, Kandee Keen, NP Taking Active   lansoprazole (PREVACID) 15 MG capsule [id number]  TAKE 1 CAPSULE BY MOUTH DAILY AT Rosiland Oz, Kandee Keen, NP  Active   levothyroxine (SYNTHROID) 75 MCG tablet [id number] No Take 1 tablet (75 mcg total) by mouth daily before breakfast. [name], NP Taking Active   lisinopril (ZESTRIL) 10 MG tablet [id number] No Take 10 mg by mouth daily. [provider] Taking Active   Multiple Vitamin (MULTIVITAMIN WITH MINERALS) TABS tablet [id number] No Take 1 tablet by mouth daily. [provider] Taking Active Self  ondansetron (ZOFRAN) 4 MG tablet [id number] No Take 1 tablet (4 mg total) by mouth every 8 (eight) hours as needed for nausea or vomiting. Nafziger, Kandee Keen, NP Taking Active   simvastatin (ZOCOR) 5 MG tablet [id number] No Take 1 tablet (5 mg total) by mouth daily. Nafziger, Kandee Keen, NP Taking Active   tiZANidine (ZANAFLEX) 2 MG tablet [id number] No Take 2 mg by mouth every 6 (six) hours as needed for muscle spasms. [provider] Taking Active   traMADol (ULTRAM) 50 MG tablet [id number] No TAKE 1 TABLET BY MOUTH TWICE A DAY AS NEEDED Nafziger, Kandee Keen, NP Taking Active             SDOH:  (Social Determinants of Health) assessments and interventions performed: Yes SDOH Interventions    Flowsheet Row Care Coordination from 04/01/2023 in [facility] Chronic Care Management from 06/23/2022 in [facility] [place]  SDOH Interventions    Food Insecurity Interventions Intervention Not Indicated --  Housing Interventions  Intervention Not Indicated --  Transportation Interventions -- Intervention Not Indicated  Financial Strain Interventions -- Intervention Not Indicated       Medication Assistance: None required.  Patient affirms current coverage meets needs.  Medication Access: Within the past 30 days, how often has patient missed a dose of medication? None Is a pillbox or other method used to improve adherence? Yes  Factors that may affect medication adherence? no barriers identified Are meds synced by current pharmacy? No  Are meds delivered by current pharmacy? No  Does patient experience delays in picking up medications due to transportation concerns? No   [facility] Reviewed: Is patient disadvantaged to use [facility]?: No  Current Rx insurance plan: [facility] Name and location of Current pharmacy:  [id number] - [place], [place] - [address] [place] [address] Phone: [phone] Fax: [phone]  [facility] services reviewed with patient today?: No  Patient requests to transfer care to [facility]?: No  Reason patient declined to change pharmacies: Not mentioned at this visit  Compliance/Adherence/Medication fill history: Care Gaps: COVID Booster  Star-Rating Drugs: Simvastatin 5mg  PDC 100% Lisinopril 10mg  PDC 0% --patients states she has and is taking   Assessment/Plan Hypertension (BP goal <140/90) -Controlled -Current treatment: Lisinopril 10mg  1 qd Appropriate, Effective, Safe, Accessible -Medications previously tried: None  -Current home readings: Did not have a log but checks about once a week and reports WNL -Current dietary habits: mindful of salt -Current exercise habits: up and down constantly throughout the day acting as caregiver for husband and taking care of  grandkids and 4 dogs -Denies hypotensive/hypertensive symptoms -Educated on BP goals and benefits of medications for prevention of heart  attack, stroke and kidney damage; Importance of home blood pressure monitoring; Proper BP monitoring technique; -Counseled to monitor BP at home weekly, document, and provide log at future appointments -Recommended to continue current medication  Hyperlipidemia: (LDL goal < 100) -Uncontrolled -Current treatment: Simvastatin 5mg  1 qd Appropriate, Query Effective -Medications previously tried: None  -Current dietary patterns: see above -Current exercise habits: see above -Educated on Cholesterol goals;  Benefits of statin for ASCVD risk reduction; -Recommended to continue current medication Recommended updated lipid panel and LFTs  Sherrill Raring Clinical Pharmacist [phone]

## 2023-03-31 ENCOUNTER — Telehealth: Payer: Self-pay

## 2023-03-31 ENCOUNTER — Encounter: Payer: Self-pay | Admitting: Family Medicine

## 2023-03-31 NOTE — Progress Notes (Signed)
Care Management & Coordination Services Pharmacy Team  Reason for Encounter: Appointment Reminder  Contacted patient to confirm telephone appointment with [name], PharmD on 04/01/2023 at 3:30. Spoke with patient on 03/31/2023   Do you have any problems getting your medications? Patient denies  What is your top health concern you would like to discuss at your upcoming visit? Patient denies  Have you seen any other providers since your last visit with PCP? Patient denies  Care Gaps: AWV - completed 02/10/2023 Last BP - 120/86 on 03/17/2023 Covid - overdue Colonoscopy - postponed   Star Rating Drugs: Simvastatin 5 mg  - last filled 01/08/2023 90 DS at [facility] Lisinopril 20 mg - last filled 10/20/2022 30 DS at [facility] (dose decreased to 10 mg daily and she has been cutting them in half )  [name] [facility]  Clinical Pharmacist Assistant [phone]

## 2023-04-01 ENCOUNTER — Ambulatory Visit: Payer: Medicare Other

## 2023-04-01 NOTE — Telephone Encounter (Signed)
Forwarding to Dr. Corey to advise.  

## 2023-04-02 ENCOUNTER — Other Ambulatory Visit: Payer: Self-pay | Admitting: Adult Health

## 2023-04-02 MED ORDER — AMPHETAMINE-DEXTROAMPHETAMINE 10 MG PO TABS: 10.0000 mg | ORAL_TABLET | Freq: Every day | ORAL | 0 refills | Status: DC

## 2023-04-02 NOTE — Telephone Encounter (Signed)
Controlled substance, forwarding to Dr. Denyse Amass to refill. Adderall 10 mg rx pending.

## 2023-04-02 NOTE — Telephone Encounter (Signed)
Okay for refill?  

## 2023-04-02 NOTE — Telephone Encounter (Signed)
Switching back to 10 from 15

## 2023-04-07 IMAGING — CR DG CHEST 2V
2 series · 2 of 2 positions shown · non-contrast
Comparison: Chest radiographs 10/17/2018 and earlier.

CLINICAL DATA: 74-year-old female preoperative study for lumbar
surgery.

EXAM:
CHEST - 2 VIEW

[w chest pa]
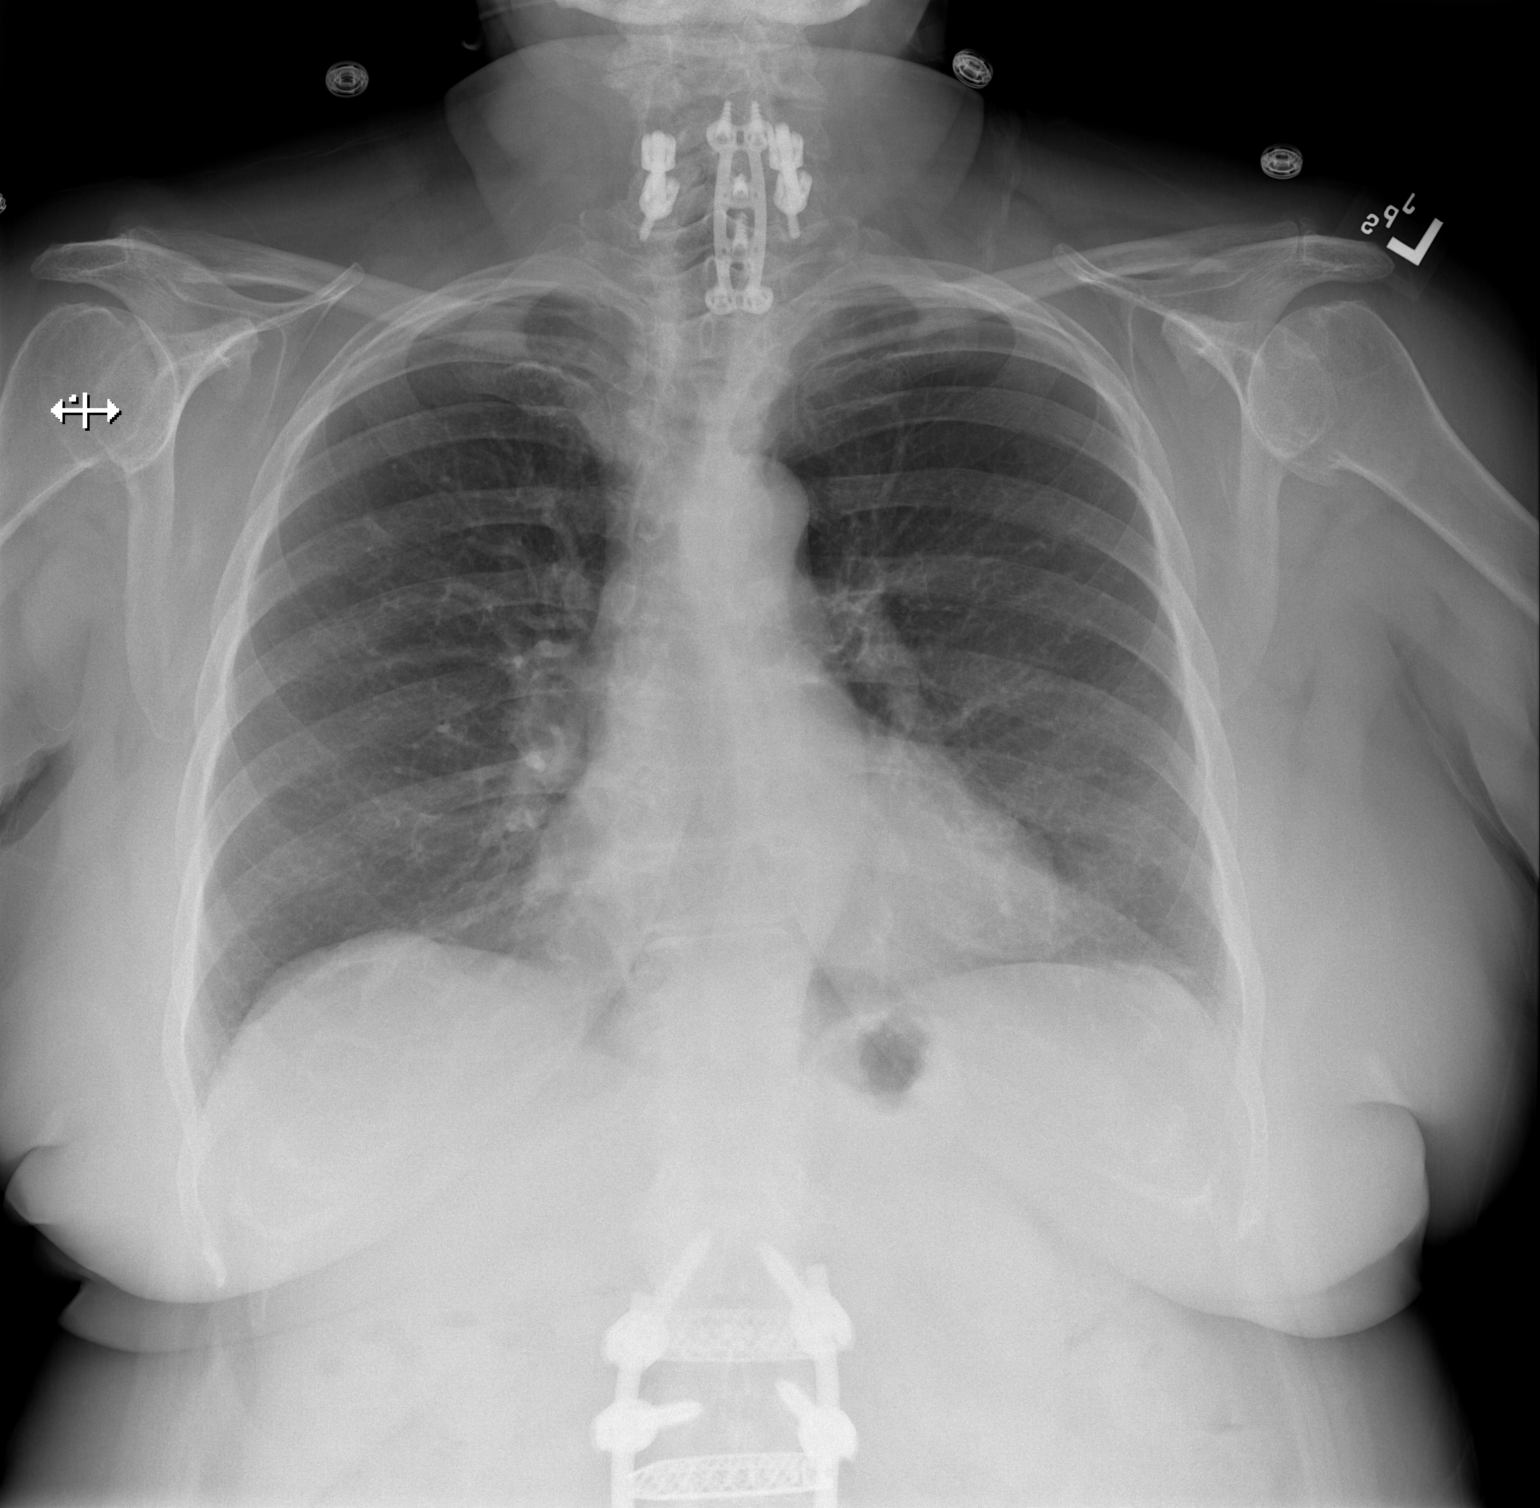

[w chest lat]
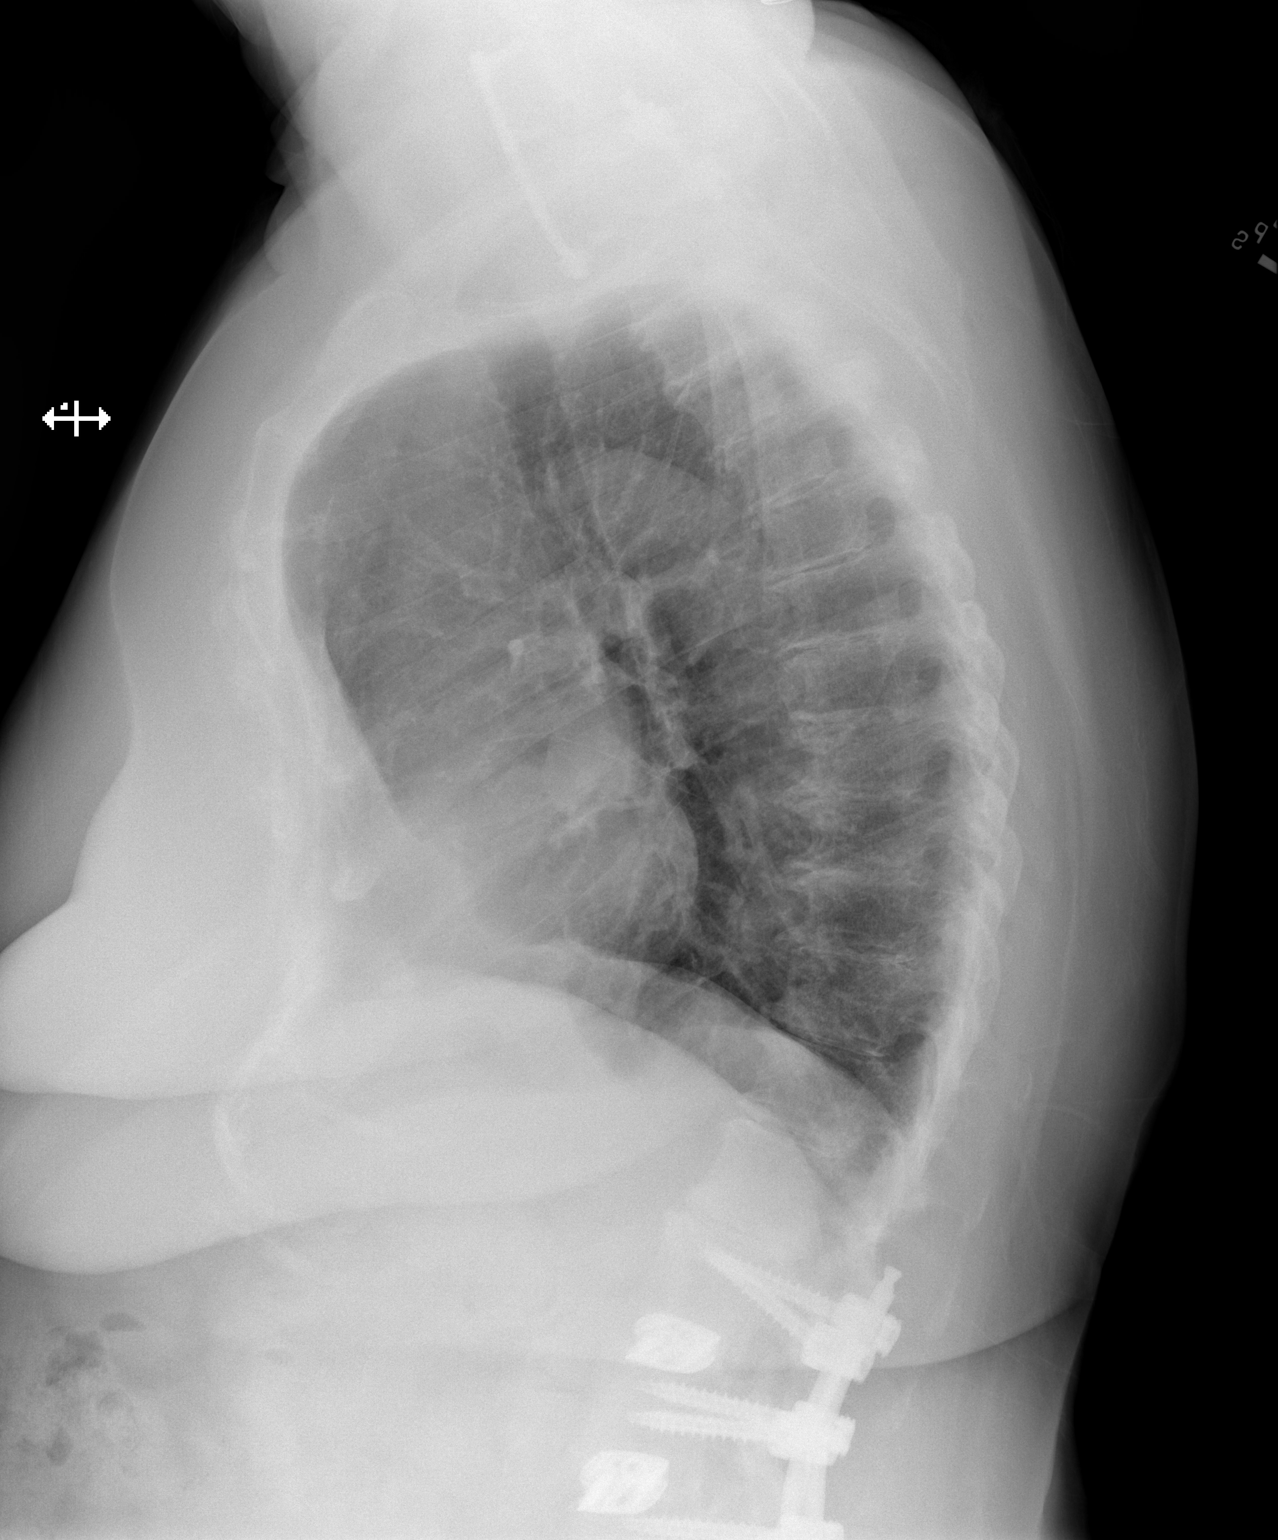

[2 of 2 positions shown; findings below may reference images not displayed]

FINDINGS: Anterior and posterior lower cervical and upper thoracic spine
fusion hardware along with multilevel lumbar posterior and interbody
fusion sequelae again noted. Widespread thoracic disc and endplate
degeneration. No acute osseous abnormality identified.

Lung volumes and mediastinal contours are stable and within normal
limits. Lung markings appear stable and within normal limits. No
pneumothorax or pleural effusion. Paucity of bowel gas.
IMPRESSION: No acute cardiopulmonary abnormality.

## 2023-04-09 ENCOUNTER — Encounter (INDEPENDENT_AMBULATORY_CARE_PROVIDER_SITE_OTHER): Payer: Medicare Other | Admitting: Ophthalmology

## 2023-04-09 DIAGNOSIS — H43813 Vitreous degeneration, bilateral: Secondary | ICD-10-CM

## 2023-04-09 DIAGNOSIS — H353112 Nonexudative age-related macular degeneration, right eye, intermediate dry stage: Secondary | ICD-10-CM

## 2023-04-09 DIAGNOSIS — H35033 Hypertensive retinopathy, bilateral: Secondary | ICD-10-CM | POA: Diagnosis not present

## 2023-04-09 DIAGNOSIS — I10 Essential (primary) hypertension: Secondary | ICD-10-CM

## 2023-04-09 DIAGNOSIS — H353124 Nonexudative age-related macular degeneration, left eye, advanced atrophic with subfoveal involvement: Secondary | ICD-10-CM

## 2023-04-14 DIAGNOSIS — H353132 Nonexudative age-related macular degeneration, bilateral, intermediate dry stage: Secondary | ICD-10-CM | POA: Diagnosis not present

## 2023-04-15 ENCOUNTER — Telehealth: Payer: Self-pay

## 2023-04-15 ENCOUNTER — Ambulatory Visit: Payer: Medicare Other | Admitting: Family Medicine

## 2023-04-15 ENCOUNTER — Ambulatory Visit (INDEPENDENT_AMBULATORY_CARE_PROVIDER_SITE_OTHER): Payer: Medicare Other

## 2023-04-15 VITALS — BP 128/78 | HR 103 | Ht 60.0 in | Wt 177.0 lb

## 2023-04-15 DIAGNOSIS — M8588 Other specified disorders of bone density and structure, other site: Secondary | ICD-10-CM | POA: Diagnosis not present

## 2023-04-15 DIAGNOSIS — M7062 Trochanteric bursitis, left hip: Secondary | ICD-10-CM | POA: Diagnosis not present

## 2023-04-15 DIAGNOSIS — M5416 Radiculopathy, lumbar region: Secondary | ICD-10-CM

## 2023-04-15 DIAGNOSIS — M7061 Trochanteric bursitis, right hip: Secondary | ICD-10-CM | POA: Diagnosis not present

## 2023-04-15 DIAGNOSIS — M545 Low back pain, unspecified: Secondary | ICD-10-CM | POA: Diagnosis not present

## 2023-04-15 DIAGNOSIS — S060X0D Concussion without loss of consciousness, subsequent encounter: Secondary | ICD-10-CM | POA: Diagnosis not present

## 2023-04-15 MED ORDER — METHYLPHENIDATE HCL ER (CD) 20 MG PO CPCR: 20.0000 mg | ORAL_CAPSULE | ORAL | 0 refills | Status: DC

## 2023-04-15 NOTE — Patient Instructions (Addendum)
Thank you for coming in today.   We will try Metadade CD. Its like ritalin. STOP adderall for now.   Please get an Xray today before you leave   I've referred you to Physical Therapy.  Let us know if you don't hear from them in one week.   Let me know how the medicine change goes  Check back in 6 weeks

## 2023-04-15 NOTE — Progress Notes (Signed)
Subjective:   I, Angel Riser, PhD, LAT, ATC acting as a scribe for Angel Graham, MD.  Chief Complaint: [name],  is a 77 y.o. female who presents for 12-month f/u concussion. She has a hx of macular degeneration and back/neck surgeries. She reports that she had a syncopal episode, mid-March, in the setting of Norovirus and dehydration, where she hit the left side of her head on the edge of the walk in shower. Pt was last seen by Dr. Denyse Amass on 03/17/23 and her Adderall dosage was increased to 15mg  and proceed to visit w/ eye doctor for photosensitivity. She sent a MyChart message on 5/14 regarding the dose of her Adderall and was switched back to 10mg .   Today, pt reports cont'd fatigue. She feels like her symptoms really vary. Her Adderall seems to take a longer time to take effect. She feels like the 15mg  made her extra tired.   She notes additional stress at home.  Her husband has dementia and just is getting started on hospice now.  She does have a history of chronic low back pain and history of lumbar fusion.  She notes pain in the low back extending down her legs and primarily at the lateral hip bilaterally.  She notes some difficulty walking and general weakness.  She is interested in physical therapy.  Injury date: mid-March  Visit #: 3  History of Present Illness:   Concussion Self-Reported Symptom Score Symptoms rated on a scale 1-6, in last 24 hours  Headache: 0   Pressure in head: 3 Neck pain: 1 Nausea or vomiting: 3 Dizziness: 2  Blurred vision: 5  Balance problems: 4 Sensitivity to light:  5 Sensitivity to noise: 0 Feeling slowed down: 5 Feeling like "in a fog": 5 "Don't feel right": 5 Difficulty concentrating: 5 Difficulty remembering: 4 Fatigue or low energy: 5 Confusion: 3 Drowsiness: 4 More emotional: 3 Irritability: 2 Sadness: 2 Nervous or anxious: 0 Trouble falling asleep: 4   Total # of Symptoms: 19/22 Total Symptom Score: 68/132  Previous Total #  of Symptoms: 15/22 Previous Symptom Score: 27/132  Tinnitus: Yes  Review of Systems: No fevers or chills    Review of History: Hypertension and hypothyroidism.  Objective:    Physical Examination Vitals:   04/15/23 1300  BP: 128/78  Pulse: (!) 103  SpO2: 96%   MSK: L-spine nontender to palpation decreased lumbar motion. Lower extremity strength generally intact except noted below. Hips bilaterally tender palpation greater trochanter reduced hip abduction strength bilaterally. Neuro: Alert and oriented normal coordination. Psych: Normal speech thought process and affect.   X-ray images lumbar spine obtained today personally and independently interpreted Multilevel posterior fusion.  No acute fractures. Await formal radiology review   Assessment and Plan   77 y.o. female with concussion.  Symptoms of multiple domains.  Concentration and attention remain a challenge.  We have started ADHD medications with Adderall and have adjusted the dose a little bit with middling results.  After discussion we will try switching to methylphenidate.  Will use Metadate CD20 milligrams and adjust from there.  She does have chronic back and leg pain and lateral hip pain.  Pain thought to be due predominantly to trochanteric bursitis.  She likely has some spinal stenosis type pain as well.  She is a good candidate for physical therapy.  Plan to refer to PT.      Action/Discussion: Reviewed diagnosis, management options, expected outcomes, and the reasons for scheduled and emergent follow-up. Questions were adequately  answered. Patient expressed verbal understanding and agreement with the following plan.     Patient Education: Reviewed with patient the risks (i.e, a repeat concussion, post-concussion syndrome, second-impact syndrome) of returning to play prior to complete resolution, and thoroughly reviewed the signs and symptoms of concussion.Reviewed need for complete resolution of all  symptoms, with rest AND exertion, prior to return to play. Reviewed red flags for urgent medical evaluation: worsening symptoms, nausea/vomiting, intractable headache, musculoskeletal changes, focal neurological deficits. [facility]'s Concussion Care Plan, which clearly outlines the plans stated above, was given to patient.   Level of service: Total encounter time 30 minutes including face-to-face time with the patient and, reviewing past medical record, and charting on the date of service.        After Visit Summary printed out and provided to patient as appropriate.  The above documentation has been reviewed and is accurate and complete [name]

## 2023-04-15 NOTE — Telephone Encounter (Signed)
Methylphenidate ER 20 mg  [email or web address] KEY: [id number]

## 2023-04-16 ENCOUNTER — Other Ambulatory Visit: Payer: Self-pay | Admitting: Adult Health

## 2023-04-16 NOTE — Telephone Encounter (Signed)
Prior Authorization initiated for Methylphenidate  via [email or web address] KEY: [id number]       The following alternatives are the preferred alternatives: AMPHETAMINE/DEXTROAMPHETAMINE, ATOMOXETINE, CLONIDINE HCL ER TB12, DEXMETHYLPHENIDATE HCL ER, LISDEXAMFETAMINE DIMESYLATE OR VYVANSE

## 2023-04-20 NOTE — Telephone Encounter (Signed)
APPROVED Methylphenidate HCl ER (CD) 20MG  er capsules  PA# [id number] Valid: 04/15/33-11/17/23

## 2023-04-20 NOTE — Progress Notes (Signed)
Lumbar spine x-ray shows prior surgeries and some concern of screw loosening at L1 and L2.  This looks unchanged compared to prior x-rays.  No fractures are visible.  If needed we could get more accurate imaging such as CT scan.

## 2023-04-23 ENCOUNTER — Encounter: Payer: Self-pay | Admitting: Adult Health

## 2023-04-23 ENCOUNTER — Ambulatory Visit (INDEPENDENT_AMBULATORY_CARE_PROVIDER_SITE_OTHER): Payer: Medicare Other | Admitting: Adult Health

## 2023-04-23 ENCOUNTER — Telehealth: Payer: Self-pay

## 2023-04-23 VITALS — BP 120/78 | HR 101 | Temp 98.0°F | Ht 60.0 in | Wt 177.0 lb

## 2023-04-23 DIAGNOSIS — E7439 Other disorders of intestinal carbohydrate absorption: Secondary | ICD-10-CM

## 2023-04-23 DIAGNOSIS — M6740 Ganglion, unspecified site: Secondary | ICD-10-CM

## 2023-04-23 MED ORDER — SEMAGLUTIDE(0.25 OR 0.5MG/DOS) 2 MG/3ML ~~LOC~~ SOPN
0.2500 mg | PEN_INJECTOR | SUBCUTANEOUS | 2 refills | Status: DC
Start: 2023-04-23 — End: 2024-01-01

## 2023-04-23 NOTE — Progress Notes (Signed)
Subjective:    Patient ID: [name], female    DOB: [birth year], 77 y.o.   MRN: [id number]  HPI  77 year old female who  has a past medical history of Anxiety, Arthritis, Chronic back pain, Chronic kidney disease (2019), Depression, GERD (gastroesophageal reflux disease), Hyperlipidemia, Hypertension, Hypothyroidism, Macular degeneration of both eyes, Osteopenia, PONV (postoperative nausea and vomiting), and Prediabetes.  She presents to the office today for multiple issues.  First issue is that of prediabetes/glucose intolerance.  In the past she was managed with Ozempic 0.25 to 0.5 mg weekly.  She had tolerated this medication well and would have been able to lose 30+ pounds on it.  Earlier this spring she received a note from her insurance company stating that they were no longer and cover this medication for her.  Earlier last month she received a second letter from [employer] company telling her to disregard the first letter and that this medication is covered for her.  She would like to go back on this medication  Wt Readings from Last 3 Encounters:  04/23/23 177 lb (80.3 kg)  04/15/23 177 lb (80.3 kg)  03/17/23 176 lb (79.8 kg)   Furthermore she has developed a ganglion cyst on pointer finger.  This does not cause any pain or loss of range of motion.   Review of Systems See HPI   Past Medical History:  Diagnosis Date   Anxiety    Arthritis    Chronic back pain    Chronic kidney disease 2019   ARF while in hospital for PLIF   Depression    GERD (gastroesophageal reflux disease)    Hyperlipidemia    Hypertension    Hypothyroidism    Macular degeneration of both eyes    not age related   Osteopenia    PONV (postoperative nausea and vomiting)    also is slow to wake up   Prediabetes     Social History   Socioeconomic History   Marital status: Married    Spouse name: Not on file   Number of children: Not on file   Years of education: Not on file   Highest  education level: 12th grade  Occupational History   Not on file  Tobacco Use   Smoking status: Never   Smokeless tobacco: Never  Vaping Use   Vaping Use: Never used  Substance and Sexual Activity   Alcohol use: Never   Drug use: Never   Sexual activity: Not on file  Other Topics Concern   Not on file  Social History Narrative   Not on file   Social Determinants of Health   Financial Resource Strain: Low Risk  (04/22/2023)   Overall Financial Resource Strain (CARDIA)    Difficulty of Paying Living Expenses: Not hard at all  Food Insecurity: No Food Insecurity (04/22/2023)   Hunger Vital Sign    Worried About Running Out of Food in the Last Year: Never true    Ran Out of Food in the Last Year: Never true  Transportation Needs: No Transportation Needs (04/22/2023)   PRAPARE - Administrator, Civil Service (Medical): No    Lack of Transportation (Non-Medical): No  Physical Activity: Unknown (04/22/2023)   Exercise Vital Sign    Days of Exercise per Week: 0 days    Minutes of Exercise per Session: Not on file  Stress: No Stress Concern Present (04/22/2023)   [employer] of Occupational Health - Occupational Stress Questionnaire  Feeling of Stress : Not at all  Social Connections: Moderately Isolated (04/22/2023)   Social Connection and Isolation Panel [NHANES]    Frequency of Communication with Friends and Family: More than three times a week    Frequency of Social Gatherings with Friends and Family: More than three times a week    Attends Religious Services: Never    Database administrator or Organizations: No    Attends Engineer, structural: Not on file    Marital Status: Married  Catering manager Violence: Not on file    Past Surgical History:  Procedure Laterality Date   ABDOMINAL HYSTERECTOMY     total   ANTERIOR CERVICAL DISCECTOMY     APPENDECTOMY     BACK SURGERY     PLIF   BUNIONECTOMY Right    CHOLECYSTECTOMY     COLONOSCOPY     HAND  SURGERY Left    thumb joint, carpal tunnel and cyst   HAND SURGERY Right    thumb and carpal tunnel   POSTERIOR CERVICAL FUSION/FORAMINOTOMY      Family History  Problem Relation Age of Onset   Breast cancer Neg Hx     Allergies  Allergen Reactions   Celecoxib     Other reaction(s): itching   Citalopram Hydrobromide     Other reaction(s): nausea   Codeine Nausea Only and Other (See Comments)    Severe headache   Latex Itching    Other reaction(s): itching----GLOVES   Meperidine Hcl     Other reaction(s): itching   Morphine And Codeine Nausea And Vomiting    And headaches   Tape Itching   Trazodone Hcl     Other reaction(s): nightmares    Current Outpatient Medications on File Prior to Visit  Medication Sig Dispense Refill   acetaminophen (TYLENOL) 650 MG CR tablet Take 650 mg by mouth every 8 (eight) hours as needed for pain.     ALPRAZolam (XANAX) 0.5 MG tablet Take 0.5 tablets (0.25 mg total) by mouth at bedtime as needed for sleep. 30 tablet 2   amphetamine-dextroamphetamine (ADDERALL) 15 MG tablet Take 15 mg by mouth daily after breakfast.     Bacillus Coagulans-Inulin (PROBIOTIC) 1-250 BILLION-MG CAPS      cyclobenzaprine (FLEXERIL) 10 MG tablet Take 1 tablet (10 mg total) by mouth 3 (three) times daily as needed for muscle spasms. 30 tablet 0   DULoxetine (CYMBALTA) 30 MG capsule TAKE ONE CAPSULE BY MOUTH AT BEDTIME 90 capsule 1   fluticasone (FLONASE) 50 MCG/ACT nasal spray Place 2 sprays into both nostrils daily. 16 g 6   lansoprazole (PREVACID) 15 MG capsule TAKE 1 CAPSULE BY MOUTH DAILY AT NOON 90 capsule 0   levothyroxine (SYNTHROID) 75 MCG tablet Take 1 tablet (75 mcg total) by mouth daily before breakfast. 90 tablet 1   lisinopril (ZESTRIL) 10 MG tablet Take 10 mg by mouth daily.     methylphenidate (METADATE CD) 20 MG CR capsule Take 1 capsule (20 mg total) by mouth every morning. 30 capsule 0   Multiple Vitamin (MULTIVITAMIN WITH MINERALS) TABS tablet Take 1  tablet by mouth daily.     ondansetron (ZOFRAN) 4 MG tablet Take 1 tablet (4 mg total) by mouth every 8 (eight) hours as needed for nausea or vomiting. 20 tablet 2   simvastatin (ZOCOR) 5 MG tablet TAKE 1 TABLET BY MOUTH DAILY 90 tablet 3   tiZANidine (ZANAFLEX) 2 MG tablet Take 2 mg by mouth every 6 (six) hours  as needed for muscle spasms.     traMADol (ULTRAM) 50 MG tablet TAKE 1 TABLET BY MOUTH TWICE A DAY AS NEEDED 60 tablet 0   No current facility-administered medications on file prior to visit.    BP 120/78   Pulse (!) 101   Temp 98 F (36.7 C) (Oral)   Ht 5' (1.524 m)   Wt 177 lb (80.3 kg)   SpO2 95%   BMI 34.57 kg/m       Objective:   Physical Exam Vitals and nursing note reviewed.  Constitutional:      Appearance: Normal appearance. She is obese.  Cardiovascular:     Rate and Rhythm: Normal rate and regular rhythm.     Pulses: Normal pulses.     Heart sounds: Normal heart sounds.  Pulmonary:     Effort: Pulmonary effort is normal.     Breath sounds: Normal breath sounds.  Musculoskeletal:        General: Normal range of motion.  Skin:    General: Skin is dry.     Comments: Ganglion cyst noted to DIP joint of right pointer finger  Neurological:     General: No focal deficit present.     Mental Status: She is alert and oriented to person, place, and time.  Psychiatric:        Mood and Affect: Mood normal.        Behavior: Behavior normal.        Thought Content: Thought content normal.        Judgment: Judgment normal.        Assessment & Plan:  1. Glucose intolerance  - Semaglutide,0.25 or 0.5MG /DOS, 2 MG/3ML SOPN; Inject 0.25 mg into the skin once a week.  Dispense: 2 mL; Refill: 2  2. Ganglion cyst - No loss of ROM or pain.  - She would like to hold off on referral for hand surgery at this time   [name], NP

## 2023-04-23 NOTE — Telephone Encounter (Signed)
*  Primary  PA request received via CMM for Ozempic (0.25 or 0.5 MG/DOSE) 2MG /3ML pen-injectors  PA submitted to [employer] Medicare Part D and is pending additional questions/determination  Patient has pre-diabetes  Key: [id number]

## 2023-04-27 NOTE — Telephone Encounter (Signed)
Decision Notes: Medicare allows [place] to cover a drug only when it is a Part D drug. A Part D drug is one that is used for a "medically accepted indication." A medically accepted indication means the use is approved by the Food and Drug Administration (FDA) OR the use is supported by one of the following accepted references: (1) [facility] Formulary Service Drug Information. (2) [employer] DRUGDEX Information System. OZEMPIC INJ 2MG /3ML is not FDA approved for your medical condition(s): Prediabetes. These condition(s) are not supported by one of the accepted references. Therefore your drug is denied because it is not being used for a "medically accepted indication."

## 2023-04-28 ENCOUNTER — Ambulatory Visit: Payer: Medicare Other | Attending: Family Medicine | Admitting: Physical Therapy

## 2023-04-28 ENCOUNTER — Telehealth: Payer: Self-pay | Admitting: Adult Health

## 2023-04-28 ENCOUNTER — Other Ambulatory Visit: Payer: Self-pay

## 2023-04-28 DIAGNOSIS — M7062 Trochanteric bursitis, left hip: Secondary | ICD-10-CM | POA: Diagnosis not present

## 2023-04-28 DIAGNOSIS — M79604 Pain in right leg: Secondary | ICD-10-CM | POA: Insufficient documentation

## 2023-04-28 DIAGNOSIS — R42 Dizziness and giddiness: Secondary | ICD-10-CM | POA: Diagnosis not present

## 2023-04-28 DIAGNOSIS — M7061 Trochanteric bursitis, right hip: Secondary | ICD-10-CM | POA: Insufficient documentation

## 2023-04-28 DIAGNOSIS — M6281 Muscle weakness (generalized): Secondary | ICD-10-CM | POA: Diagnosis not present

## 2023-04-28 DIAGNOSIS — M5416 Radiculopathy, lumbar region: Secondary | ICD-10-CM | POA: Insufficient documentation

## 2023-04-28 DIAGNOSIS — M79605 Pain in left leg: Secondary | ICD-10-CM | POA: Insufficient documentation

## 2023-04-28 DIAGNOSIS — S060X0D Concussion without loss of consciousness, subsequent encounter: Secondary | ICD-10-CM | POA: Diagnosis not present

## 2023-04-28 DIAGNOSIS — M5459 Other low back pain: Secondary | ICD-10-CM | POA: Diagnosis not present

## 2023-04-28 DIAGNOSIS — R2681 Unsteadiness on feet: Secondary | ICD-10-CM | POA: Insufficient documentation

## 2023-04-28 NOTE — Telephone Encounter (Signed)
Patient dropped off document Handicap Placard, to be filled out by provider. Patient requested to send it via Call Patient to pick up within 7-days. Document is located in providers tray at front office.Please advise at Mobile [phone] (mobile)

## 2023-04-28 NOTE — Therapy (Signed)
OUTPATIENT PHYSICAL THERAPY NEURO EVALUATION   Patient Name: [name] MRN: [id number] DOB:06/10/1946, 77 y.o., female Today's Date: 04/28/2023   PCP: [name] NP  REFERRING PROVIDER: Rodolph Bong, MD  END OF SESSION:  PT End of Session - 04/28/23 1618     Visit Number 1    Number of Visits 17    Date for PT Re-Evaluation 06/23/23    Authorization Type [employer] MCR    Authorization Time Period 04/28/23 to 06/23/23    Progress Note Due on Visit 10    PT Start Time 1533    PT Stop Time 1612    PT Time Calculation (min) 39 min    Activity Tolerance Patient tolerated treatment well    Behavior During Therapy [facility] for tasks assessed/performed;Impulsive             Past Medical History:  Diagnosis Date   Anxiety    Arthritis    Chronic back pain    Chronic kidney disease 2019   ARF while in hospital for PLIF   Depression    GERD (gastroesophageal reflux disease)    Hyperlipidemia    Hypertension    Hypothyroidism    Macular degeneration of both eyes    not age related   Osteopenia    PONV (postoperative nausea and vomiting)    also is slow to wake up   Prediabetes    Past Surgical History:  Procedure Laterality Date   ABDOMINAL HYSTERECTOMY     total   ANTERIOR CERVICAL DISCECTOMY     APPENDECTOMY     BACK SURGERY     PLIF   BUNIONECTOMY Right    CHOLECYSTECTOMY     COLONOSCOPY     HAND SURGERY Left    thumb joint, carpal tunnel and cyst   HAND SURGERY Right    thumb and carpal tunnel   POSTERIOR CERVICAL FUSION/FORAMINOTOMY     Patient Active Problem List   Diagnosis Date Noted   Prediabetes 06/26/2022   Hyperlipidemia 06/26/2022   Osteopenia 06/26/2022   Hypothyroidism 12/24/2021   Hypertension 12/24/2021   Depression 12/24/2021   S/P lumbar fusion 02/25/2021    ONSET DATE: back and hip pain- chronic, concussion several months ago  REFERRING DIAG: S06.0X0D (ICD-10-CM) - Concussion without loss of consciousness, subsequent encounter  M54.16 (ICD-10-CM) - Lumbar radiculopathy M70.61,M70.62 (ICD-10-CM) - Trochanteric bursitis of both hips  THERAPY DIAG:  Other low back pain  Pain in right leg  Pain in left leg  Muscle weakness (generalized)  Unsteadiness on feet  Dizziness and giddiness  Rationale for Evaluation and Treatment: Rehabilitation  SUBJECTIVE:  SUBJECTIVE STATEMENT:  Had a couple of falls several months ago, 2nd fall I hit my head and noticed buzzing in my head, got really lethargic and couldn't focus well, eyes were going crazy. Fatigue is really bothering me, I'm also dizzy and off balance but I'm not sure if this is from the fall or my chronic vision issues. Because of the back and the hips I struggle with walking due to pain, now I'm getting a lot of charlie horses all the way up. Hips have been bothering me more these days. Have had some surgeries on my back, rods and then another surgery for rod lengthening. I'm pretty active and I have a busy household. Have a husband with dementia, 4 dogs, and grand children   Pt accompanied by: self  PERTINENT HISTORY: Anxiety, OA, chronic back pain, CKD, depression, HLD, HTN, macular degeneration, pre-diabetic, ACDF, hx back surgery, hx hand surgery, posterior cervical fusion/foraminotomy  PAIN:  Are you having pain? Yes: NPRS scale: 5-6/10 Pain location: thoracic spine, R shoulder  Pain description: aching Aggravating factors: not sure about thoracic pain, rest of pain is walking/getting up  Relieving factors: rest  PRECAUTIONS: Fall  WEIGHT BEARING RESTRICTIONS: No  FALLS: Has patient fallen in last 6 months? Yes. Number of falls 3; (+) fear of falling   LIVING ENVIRONMENT: Lives with: lives with their family Lives in: House/apartment Stairs: No Has following  equipment at home: Single point cane, Walker - 2 wheeled, and rails on bed   PLOF: Independent, Independent with basic ADLs, Independent with gait, and Independent with transfers  PATIENT GOALS: would focus on the back/hips/leg pain and balance first, come back to the dizziness later on "I can deal with it"   OBJECTIVE:   DIAGNOSTIC FINDINGS:   X-ray images lumbar spine obtained today personally and independently interpreted Multilevel posterior fusion.  No acute fractures. Await formal radiology review  IMPRESSION: 1. Osteopenia and degenerative and postsurgical change without evidence of fractures or malalignment. 2. Postsurgical changes as above, and the fusions appear solid at all levels except possibly at L1-2 where there may be residual patency of the disc at least posteriorly. 3. Bony lucency of 1-2 mm surrounds the screws at L1 and L2, suggestive of mild screw loosening but this is unchanged. 4. Aortic atherosclerosis.  COGNITION: Overall cognitive status:  extremely pleasant but very talkative and tangential, seems a bit impulsive not sure if this is baseline or new since concussion       POSTURE: rounded shoulders, forward head, decreased lumbar lordosis, increased thoracic kyphosis, flexed trunk , and hx of multiple lumbar surgeries      FLEXIBILITY  Hamstrings moderate limitation B  Piriformis severe limitation B    LUMBAR RANGE OF MOTION  Lumbar flexion severe limitation Lumbar extension severe limitation Lumbar lateral flexion severe limitation B Lumbar rotation severe limitation B      LOWER EXTREMITY MMT:    MMT Right Eval Left Eval  Hip flexion 4 4  Hip extension    Hip abduction 3 3  Hip adduction    Hip internal rotation    Hip external rotation    Knee flexion 4+ 4+  Knee extension 4 4+  Ankle dorsiflexion 4+ 4+  Ankle plantarflexion    Ankle inversion    Ankle eversion    (Blank rows = not tested)  BED MOBILITY:  Sit to  supine Complete Independence Supine to sit Complete Independence Rolling to Right Complete Independence Rolling to Left Complete Independence  GAIT: Gait pattern: step through pattern, decreased arm swing- Right, decreased arm swing- Left, trendelenburg, trunk flexed, and wide BOS Distance walked: in clinic distances  Assistive device utilized: None Level of assistance: Complete Independence Comments: slow but steady   FUNCTIONAL TESTS:  Dynamic Gait Index: 13/24  PATIENT SURVEYS:    TODAY'S TREATMENT:                                                                                                                              DATE:   Eval  Objective measures, appropriate education, care planning  TherEx  Bridges x10 Lumbar rotation 5x3 second holds B   SKTC 5x3 seconds B    PATIENT EDUCATION: Education details: exam findings, POC, HEP; given symptom behavior during ortho side of evaluation, BPPV is very possible and may able to be easily addressed- we can check this next session Person educated: Patient Education method: Explanation, Demonstration, and Handouts Education comprehension: verbalized understanding, returned demonstration, and needs further education  HOME EXERCISE PROGRAM: Access Code: [email or web address] URL: [email or web address] Date: 04/28/2023 Prepared by: Nedra Hai  Exercises - Supine Bridge  - 2 x daily - 7 x weekly - 1 sets - 10 reps - 2 hold - Supine Lower Trunk Rotation  - 2 x daily - 7 x weekly - 1 sets - 10 reps - 3 hold - Supine Single Knee to Chest Stretch  - 2 x daily - 7 x weekly - 1 sets - 10 reps - 3 hold  GOALS: Goals reviewed with patient? Yes  SHORT TERM GOALS: Target date: 05/26/2023    Will be compliant with appropriate progressive HEP  Baseline: Goal status: INITIAL  2.  Lumbar ROM to be no more than moderately limited all planes  Baseline:  Goal status: INITIAL  3.  Hamstring and piriformis flexibility  limitations to have improved by at least 50%  Baseline:  Goal status: INITIAL  4.  Dizziness to have improved by at least 50% in intensity and frequency  Baseline:  Goal status: INITIAL  5.  Will demonstrate good biomechanics in replicated situations involving caring for her husband to avoid aggravating back pain  Baseline:  Goal status: INITIAL  LONG TERM GOALS: Target date: 06/23/2023    MMT to improve by 1 grade in all weak groups  Baseline:  Goal status: INITIAL  2.  Will score at least 19/24 on DGI to show reduced fall risk Baseline:  Goal status: INITIAL  3.  Dizziness to have resolved  Baseline:  Goal status: INITIAL  4.  Average lumbar and hip/BLE pain to have improved by 50% to assist in improving functional mobility and community access   Baseline:  Goal status: INITIAL  5.  Will be compliant with advanced HEP vs gym based exercise classes to maintain functional gains and prevent recurrence of pain/mobility impairments  Baseline:  Goal status: INITIAL   ASSESSMENT:  CLINICAL IMPRESSION: Patient is a 77 y.o. F who was  seen today for physical therapy evaluation and treatment for low back pain, B trochanteric bursitis, and skilled post-concussion care. She requests that we focus on the back and legs first, based on symptom behavior of her dizziness I do suspect this is likely BPPV and may be easily addressed- we will take a look next session. Will benefit from skilled PT services to address lumbar and hip immobility, functional muscle weakness, balance impairment, poor functional activity tolerance, and ongoing dizziness as well as to provide skilled PT guidance on concussion recovery.    OBJECTIVE IMPAIRMENTS: Abnormal gait, decreased activity tolerance, decreased balance, decreased mobility, difficulty walking, decreased safety awareness, dizziness, impaired flexibility, improper body mechanics, postural dysfunction, obesity, and pain.   ACTIVITY LIMITATIONS:  carrying, lifting, bending, squatting, stairs, transfers, locomotion level, and caring for others  PARTICIPATION LIMITATIONS: meal prep, cleaning, laundry, driving, shopping, community activity, and yard work  PERSONAL FACTORS: Age, Behavior pattern, Education, Fitness, Past/current experiences, Social background, and Time since onset of injury/illness/exacerbation are also affecting patient's functional outcome.   REHAB POTENTIAL: Good  CLINICAL DECISION MAKING: Evolving/moderate complexity  EVALUATION COMPLEXITY: Moderate  PLAN:  PT FREQUENCY: 2x/week  PT DURATION: 8 weeks  PLANNED INTERVENTIONS: Therapeutic exercises, Therapeutic activity, Neuromuscular re-education, Balance training, Gait training, Patient/Family education, Self Care, Vestibular training, Aquatic Therapy, Dry Needling, Electrical stimulation, Cryotherapy, Moist heat, Ultrasound, Ionotophoresis 4mg /ml Dexamethasone, Manual therapy, and Re-evaluation  PLAN FOR NEXT SESSION: assess vertigo- potential BPPV? Post- concussion education, otherwise lumbar and hip mobility/flexibility, strength and balance   Nedra Hai PT DPT PN2

## 2023-04-29 NOTE — Telephone Encounter (Signed)
"  is denied because it is not being used for a "medically accepted indication." Please advise

## 2023-04-29 NOTE — Telephone Encounter (Signed)
Formed filled out and placed in front office Investment banker, corporate. Called pt 2x no answer.

## 2023-04-30 NOTE — Telephone Encounter (Signed)
Tried to call pt no answer

## 2023-04-30 NOTE — Telephone Encounter (Signed)
Patient notified of update  and verbalized understanding. 

## 2023-04-30 NOTE — Telephone Encounter (Signed)
Pt stated she started a shot she has enough for 1 month.

## 2023-05-01 ENCOUNTER — Other Ambulatory Visit: Payer: Self-pay | Admitting: Adult Health

## 2023-05-01 NOTE — Therapy (Signed)
OUTPATIENT PHYSICAL THERAPY NEURO TREATMENT   Patient Name: [name] MRN: [id number] DOB:01/27/1946, 77 y.o., female Today's Date: 05/04/2023   PCP: [name] NP  REFERRING PROVIDER: Rodolph Bong, MD  END OF SESSION:  PT End of Session - 05/04/23 1611     Visit Number 2    Number of Visits 17    Date for PT Re-Evaluation 06/23/23    Authorization Type [employer] MCR    Authorization Time Period 04/28/23 to 06/23/23    Progress Note Due on Visit 10    PT Start Time 1532    PT Stop Time 1613    PT Time Calculation (min) 41 min    Activity Tolerance Patient tolerated treatment well    Behavior During Therapy [facility] for tasks assessed/performed              Past Medical History:  Diagnosis Date   Anxiety    Arthritis    Chronic back pain    Chronic kidney disease 2019   ARF while in hospital for PLIF   Depression    GERD (gastroesophageal reflux disease)    Hyperlipidemia    Hypertension    Hypothyroidism    Macular degeneration of both eyes    not age related   Osteopenia    PONV (postoperative nausea and vomiting)    also is slow to wake up   Prediabetes    Past Surgical History:  Procedure Laterality Date   ABDOMINAL HYSTERECTOMY     total   ANTERIOR CERVICAL DISCECTOMY     APPENDECTOMY     BACK SURGERY     PLIF   BUNIONECTOMY Right    CHOLECYSTECTOMY     COLONOSCOPY     HAND SURGERY Left    thumb joint, carpal tunnel and cyst   HAND SURGERY Right    thumb and carpal tunnel   POSTERIOR CERVICAL FUSION/FORAMINOTOMY     Patient Active Problem List   Diagnosis Date Noted   Prediabetes 06/26/2022   Hyperlipidemia 06/26/2022   Osteopenia 06/26/2022   Hypothyroidism 12/24/2021   Hypertension 12/24/2021   Depression 12/24/2021   S/P lumbar fusion 02/25/2021    ONSET DATE: back and hip pain- chronic, concussion several months ago  REFERRING DIAG: S06.0X0D (ICD-10-CM) - Concussion without loss of consciousness, subsequent encounter M54.16  (ICD-10-CM) - Lumbar radiculopathy M70.61,M70.62 (ICD-10-CM) - Trochanteric bursitis of both hips  THERAPY DIAG:  Other low back pain  Pain in right leg  Pain in left leg  Muscle weakness (generalized)  Unsteadiness on feet  Dizziness and giddiness  Rationale for Evaluation and Treatment: Rehabilitation  SUBJECTIVE:  SUBJECTIVE STATEMENT: Asking if she can rearrange her schedule d/t having a lot going on at home. Reports that she has been HA's for the past week and has a hx of chronic neck pain however notes a "grabbing" when she turns her head a certain way. Denies changes in UE N/T or radiation. Reports dizziness when getting out of bed, or turns.    Pt accompanied by: self  PERTINENT HISTORY: Anxiety, OA, chronic back pain, CKD, depression, HLD, HTN, macular degeneration, pre-diabetic, ACDF, hx back surgery, hx hand surgery, posterior cervical fusion/foraminotomy  PAIN:  Are you having pain? Yes: NPRS scale: 5/10 Pain location: thoracic and lumbar spine  Pain description: aching Aggravating factors: not sure about thoracic pain, rest of pain is walking/getting up  Relieving factors: rest  PRECAUTIONS: Fall  WEIGHT BEARING RESTRICTIONS: No  FALLS: Has patient fallen in last 6 months? Yes. Number of falls 3; (+) fear of falling   LIVING ENVIRONMENT: Lives with: lives with their family Lives in: House/apartment Stairs: No Has following equipment at home: Single point cane, Environmental consultant - 2 wheeled, and rails on bed   PLOF: Independent, Independent with basic ADLs, Independent with gait, and Independent with transfers  PATIENT GOALS: would focus on the back/hips/leg pain and balance first, come back to the dizziness later on "I can deal with it"   OBJECTIVE:       TODAY'S TREATMENT:  05/04/23  CERVICAL ROM:   Active ROM AROM (deg) eval  Flexion 38  Extension 25  Right lateral flexion 10 *stiff  Left lateral flexion 23 *stiff  Right rotation 38 *stiff  Left rotation 61 *stiff   (Blank rows = not tested)   VESTIBULAR ASSESSMENT   GENERAL OBSERVATION: pt wears trifocals. She reports cataracts and macular degeneration    OCULOMOTOR EXAM:   Ocular Alignment: normal   Ocular ROM: No Limitations   Spontaneous Nystagmus: absent   Gaze-Induced Nystagmus: absent   Smooth Pursuits:  1-2 saccades horizontal, 3-4 vertically    Saccades: intact   Convergence/Divergence: 2 cm    VESTIBULAR - OCULAR REFLEX:    Slow VOR: Normal   VOR Cancellation: Normal   Head-Impulse Test: HIT Right: negative HIT Left: negative       POSITIONAL TESTING:  Right Roll Test: very subtle 2-3 beats R torsional nystagmus  Left Roll Test: Negative  Right Sidelying: negative Left Sidelying: L upbeating torsional nystagmus; Duration: 20 sec  Left Dix-Hallpike: negative   Patient tolerated L epley without complaints.   PATIENT EDUCATION: Education details: edu on exam findings, clinical reasoning for tx with habituation, POC Person educated: Patient Education method: Explanation, Demonstration, Tactile cues, Verbal cues, and Handouts Education comprehension: verbalized understanding and returned demonstration   HOME EXERCISE PROGRAM Last updated: 05/04/23 Access Code: [email or web address] URL: [email or web address] Date: 05/04/2023 Prepared by: [facility] - Outpatient  Rehab - [facility]  Exercises - Supine Bridge  - 2 x daily - 7 x weekly - 1 sets - 10 reps - 2 hold - Supine Lower Trunk Rotation  - 2 x daily - 7 x weekly - 1 sets - 10 reps - 3 hold - Supine Single Knee to Chest Stretch  - 2 x daily - 7 x weekly - 1 sets - 10 reps - 3 hold - Brandt-Daroff Vestibular Exercise  - 1 x daily - 5 x weekly - 2 sets - 3-5 reps   Below measures were taken at time of initial  evaluation unless otherwise specified:   DIAGNOSTIC FINDINGS:  X-ray images lumbar spine obtained today personally and independently interpreted Multilevel posterior fusion.  No acute fractures. Await formal radiology review  IMPRESSION: 1. Osteopenia and degenerative and postsurgical change without evidence of fractures or malalignment. 2. Postsurgical changes as above, and the fusions appear solid at all levels except possibly at L1-2 where there may be residual patency of the disc at least posteriorly. 3. Bony lucency of 1-2 mm surrounds the screws at L1 and L2, suggestive of mild screw loosening but this is unchanged. 4. Aortic atherosclerosis.  COGNITION: Overall cognitive status:  extremely pleasant but very talkative and tangential, seems a bit impulsive not sure if this is baseline or new since concussion       POSTURE: rounded shoulders, forward head, decreased lumbar lordosis, increased thoracic kyphosis, flexed trunk , and hx of multiple lumbar surgeries      FLEXIBILITY  Hamstrings moderate limitation B  Piriformis severe limitation B    LUMBAR RANGE OF MOTION  Lumbar flexion severe limitation Lumbar extension severe limitation Lumbar lateral flexion severe limitation B Lumbar rotation severe limitation B      LOWER EXTREMITY MMT:    MMT Right Eval Left Eval  Hip flexion 4 4  Hip extension    Hip abduction 3 3  Hip adduction    Hip internal rotation    Hip external rotation    Knee flexion 4+ 4+  Knee extension 4 4+  Ankle dorsiflexion 4+ 4+  Ankle plantarflexion    Ankle inversion    Ankle eversion    (Blank rows = not tested)  BED MOBILITY:  Sit to supine Complete Independence Supine to sit Complete Independence Rolling to Right Complete Independence Rolling to Left Complete Independence       GAIT: Gait pattern: step through pattern, decreased arm swing- Right, decreased arm swing- Left, trendelenburg, trunk flexed, and  wide BOS Distance walked: in clinic distances  Assistive device utilized: None Level of assistance: Complete Independence Comments: slow but steady   FUNCTIONAL TESTS:  Dynamic Gait Index: 13/24  PATIENT SURVEYS:    TODAY'S TREATMENT:                                                                                                                              DATE:   Eval  Objective measures, appropriate education, care planning  TherEx  Bridges x10 Lumbar rotation 5x3 second holds B   SKTC 5x3 seconds B    PATIENT EDUCATION: Education details: exam findings, POC, HEP; given symptom behavior during ortho side of evaluation, BPPV is very possible and may able to be easily addressed- we can check this next session Person educated: Patient Education method: Explanation, Demonstration, and Handouts Education comprehension: verbalized understanding, returned demonstration, and needs further education  HOME EXERCISE PROGRAM: Access Code: [email or web address] URL: [email or web address] Date: 04/28/2023 Prepared by: Nedra Hai  Exercises - Supine Bridge  - 2 x daily - 7 x weekly - 1 sets - 10 reps -  2 hold - Supine Lower Trunk Rotation  - 2 x daily - 7 x weekly - 1 sets - 10 reps - 3 hold - Supine Single Knee to Chest Stretch  - 2 x daily - 7 x weekly - 1 sets - 10 reps - 3 hold  GOALS: Goals reviewed with patient? Yes  SHORT TERM GOALS: Target date: 05/26/2023    Will be compliant with appropriate progressive HEP  Baseline: Goal status: IN PROGRESS  2.  Lumbar ROM to be no more than moderately limited all planes  Baseline:  Goal status: IN PROGRESS  3.  Hamstring and piriformis flexibility limitations to have improved by at least 50%  Baseline:  Goal status: IN PROGRESS  4.  Dizziness to have improved by at least 50% in intensity and frequency  Baseline:  Goal status: IN PROGRESS  5.  Will demonstrate good biomechanics in replicated situations involving  caring for her husband to avoid aggravating back pain  Baseline:  Goal status: IN PROGRESS  LONG TERM GOALS: Target date: 06/23/2023    MMT to improve by 1 grade in all weak groups  Baseline:  Goal status: IN PROGRESS  2.  Will score at least 19/24 on DGI to show reduced fall risk Baseline:  Goal status: IN PROGRESS  3.  Dizziness to have resolved  Baseline:  Goal status: IN PROGRESS  4.  Average lumbar and hip/BLE pain to have improved by 50% to assist in improving functional mobility and community access   Baseline:  Goal status: IN PROGRESS  5.  Will be compliant with advanced HEP vs gym based exercise classes to maintain functional gains and prevent recurrence of pain/mobility impairments  Baseline:  Goal status: IN PROGRESS   ASSESSMENT:  CLINICAL IMPRESSION: Patient arrived to session with request to make changes to POC d/t family stress and deaths in the family. Reports chronic neck pain and c/o neck "grabbing" when she turns her head a certain way. Denies changes in UE N/T or radiation. Cervical AROM appeared limited and patient c/o stiffness. Vestibular exam revealed saccades with smooth pursuits, and positive L sidelying test. Findings likely indicating of concussion and positional vertigo in the setting of 3 episodes of head trauma.  Patient tolerated L Epley x1 without complaints. Retest with L sidelying test was still positive, thus d/t patient tolerated this positioning fairly well today, updated HEP with Austin Miles to be performed with supervision. Patient reported understanding of all edu provided today and without complaints upon leaving.   OBJECTIVE IMPAIRMENTS: Abnormal gait, decreased activity tolerance, decreased balance, decreased mobility, difficulty walking, decreased safety awareness, dizziness, impaired flexibility, improper body mechanics, postural dysfunction, obesity, and pain.   ACTIVITY LIMITATIONS: carrying, lifting, bending, squatting, stairs,  transfers, locomotion level, and caring for others  PARTICIPATION LIMITATIONS: meal prep, cleaning, laundry, driving, shopping, community activity, and yard work  PERSONAL FACTORS: Age, Behavior pattern, Education, Fitness, Past/current experiences, Social background, and Time since onset of injury/illness/exacerbation are also affecting patient's functional outcome.   REHAB POTENTIAL: Good  CLINICAL DECISION MAKING: Evolving/moderate complexity  EVALUATION COMPLEXITY: Moderate  PLAN:  PT FREQUENCY: 2x/week  PT DURATION: 8 weeks  PLANNED INTERVENTIONS: Therapeutic exercises, Therapeutic activity, Neuromuscular re-education, Balance training, Gait training, Patient/Family education, Self Care, Vestibular training, Aquatic Therapy, Dry Needling, Electrical stimulation, Cryotherapy, Moist heat, Ultrasound, Ionotophoresis 4mg /ml Dexamethasone, Manual therapy, and Re-evaluation  PLAN FOR NEXT SESSION: reassess L sidelying test;  Post- concussion education, otherwise lumbar and hip mobility/flexibility, strength and balance    Denton Ar  Namon Cirri, PT, DPT 05/04/23 4:16 PM  [facility] at [facility] [address] [place], [address] [place], [place] [address] Phone # [phone] Fax # [phone]

## 2023-05-04 ENCOUNTER — Ambulatory Visit: Payer: Medicare Other | Admitting: Physical Therapy

## 2023-05-04 ENCOUNTER — Encounter: Payer: Self-pay | Admitting: Physical Therapy

## 2023-05-04 ENCOUNTER — Encounter: Payer: Self-pay | Admitting: Family Medicine

## 2023-05-04 DIAGNOSIS — R42 Dizziness and giddiness: Secondary | ICD-10-CM

## 2023-05-04 DIAGNOSIS — R2681 Unsteadiness on feet: Secondary | ICD-10-CM | POA: Diagnosis not present

## 2023-05-04 DIAGNOSIS — M5416 Radiculopathy, lumbar region: Secondary | ICD-10-CM | POA: Diagnosis not present

## 2023-05-04 DIAGNOSIS — M6281 Muscle weakness (generalized): Secondary | ICD-10-CM | POA: Diagnosis not present

## 2023-05-04 DIAGNOSIS — S060X0D Concussion without loss of consciousness, subsequent encounter: Secondary | ICD-10-CM | POA: Diagnosis not present

## 2023-05-04 DIAGNOSIS — M79605 Pain in left leg: Secondary | ICD-10-CM | POA: Diagnosis not present

## 2023-05-04 DIAGNOSIS — M7061 Trochanteric bursitis, right hip: Secondary | ICD-10-CM | POA: Diagnosis not present

## 2023-05-04 DIAGNOSIS — M5459 Other low back pain: Secondary | ICD-10-CM

## 2023-05-04 DIAGNOSIS — M7062 Trochanteric bursitis, left hip: Secondary | ICD-10-CM | POA: Diagnosis not present

## 2023-05-04 DIAGNOSIS — M79604 Pain in right leg: Secondary | ICD-10-CM | POA: Diagnosis not present

## 2023-05-04 NOTE — Telephone Encounter (Signed)
Forwarding to Dr. Corey as FYI.  

## 2023-05-07 ENCOUNTER — Ambulatory Visit: Payer: Medicare Other | Admitting: Physical Therapy

## 2023-05-11 ENCOUNTER — Ambulatory Visit: Payer: Medicare Other | Admitting: Physical Therapy

## 2023-05-13 ENCOUNTER — Other Ambulatory Visit: Payer: Self-pay | Admitting: Adult Health

## 2023-05-13 ENCOUNTER — Ambulatory Visit: Payer: Medicare Other | Admitting: Physical Therapy

## 2023-05-13 ENCOUNTER — Encounter: Payer: Medicare Other | Admitting: Family Medicine

## 2023-05-13 NOTE — Telephone Encounter (Signed)
Okay for refill?  

## 2023-05-23 ENCOUNTER — Other Ambulatory Visit: Payer: Self-pay | Admitting: Adult Health

## 2023-05-26 ENCOUNTER — Encounter: Payer: Self-pay | Admitting: Family Medicine

## 2023-05-26 ENCOUNTER — Ambulatory Visit: Payer: Medicare Other | Admitting: Physical Therapy

## 2023-05-26 MED ORDER — AMPHETAMINE-DEXTROAMPHETAMINE 10 MG PO TABS: 10.0000 mg | ORAL_TABLET | Freq: Every day | ORAL | 0 refills | Status: DC

## 2023-05-26 NOTE — Telephone Encounter (Signed)
Okay for refill?  

## 2023-05-27 ENCOUNTER — Other Ambulatory Visit: Payer: Self-pay | Admitting: Adult Health

## 2023-05-28 ENCOUNTER — Ambulatory Visit: Payer: Medicare Other | Admitting: Physical Therapy

## 2023-05-28 MED ORDER — LISINOPRIL 10 MG PO TABS: 10.0000 mg | ORAL_TABLET | Freq: Every day | ORAL | 0 refills | Status: DC

## 2023-06-02 ENCOUNTER — Ambulatory Visit: Payer: Medicare Other

## 2023-06-02 ENCOUNTER — Other Ambulatory Visit: Payer: Self-pay | Admitting: Adult Health

## 2023-06-04 ENCOUNTER — Ambulatory Visit: Payer: Medicare Other | Admitting: Physical Therapy

## 2023-06-09 ENCOUNTER — Ambulatory Visit: Payer: Medicare Other | Admitting: Physical Therapy

## 2023-06-11 ENCOUNTER — Ambulatory Visit: Payer: Medicare Other | Admitting: Physical Therapy

## 2023-06-16 ENCOUNTER — Ambulatory Visit: Payer: Medicare Other

## 2023-06-18 ENCOUNTER — Ambulatory Visit: Payer: Medicare Other

## 2023-06-23 ENCOUNTER — Ambulatory Visit: Payer: Medicare Other

## 2023-06-25 ENCOUNTER — Ambulatory Visit: Payer: Medicare Other

## 2023-06-27 ENCOUNTER — Other Ambulatory Visit: Payer: Self-pay | Admitting: Family Medicine

## 2023-06-29 ENCOUNTER — Ambulatory Visit: Payer: Medicare Other | Admitting: Physical Therapy

## 2023-06-30 ENCOUNTER — Ambulatory Visit: Payer: Medicare Other | Admitting: Physical Therapy

## 2023-06-30 MED ORDER — AMPHETAMINE-DEXTROAMPHETAMINE 10 MG PO TABS: 10.0000 mg | ORAL_TABLET | Freq: Every day | ORAL | 0 refills | Status: DC

## 2023-07-05 IMAGING — CT CT MAXILLOFACIAL W/O CM
3 series · 16 of 47 positions shown, 19 images · non-contrast
Comparison: None.

CLINICAL DATA: Fall, facial trauma, gaseous above left eye and left
eye bruising, headache

EXAM:
CT HEAD WITHOUT CONTRAST
CT MAXILLOFACIAL WITHOUT CONTRAST
TECHNIQUE: Multidetector CT imaging of the head and maxillofacial structures
were performed using the standard protocol without intravenous
contrast. Multiplanar CT image reconstructions of the maxillofacial
structures were also generated.

[Series 3: max soft · axial · 0.42mm/px · z∈[-147,+17]mm · 10 of 96 slices shown, 13 images]
[im 7/96  brain]
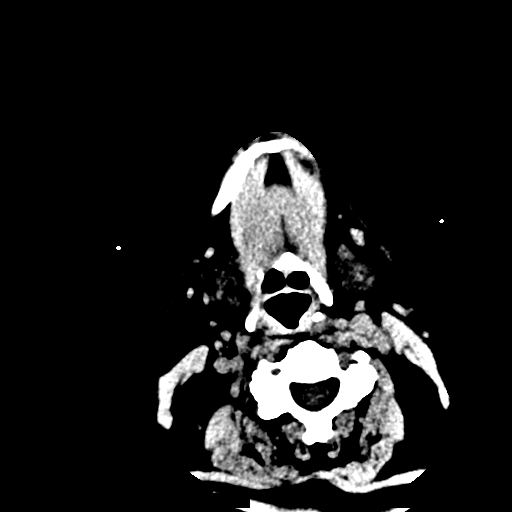
[im 7/96  bone]
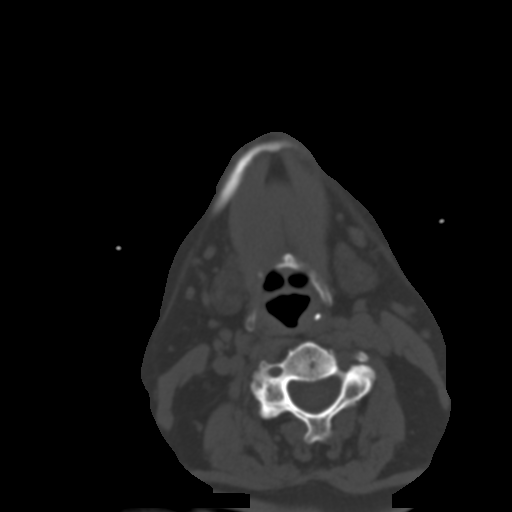
[im 17/96  bone]
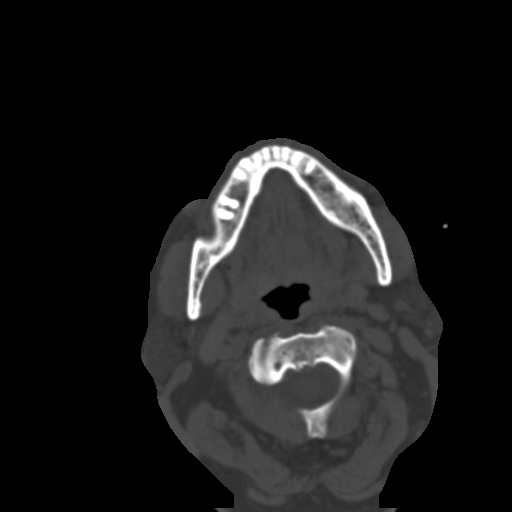
[im 27/96  bone]
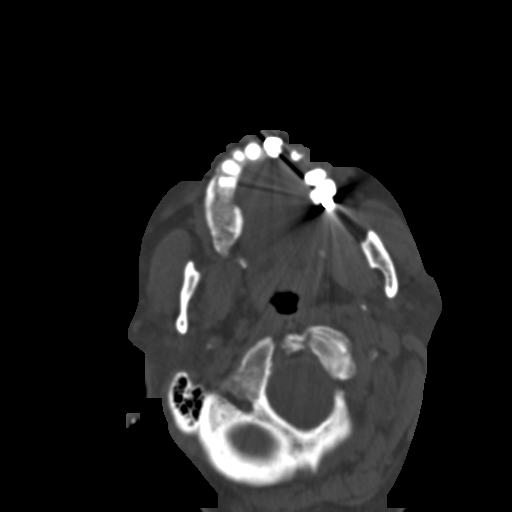
[im 33/96  bone]
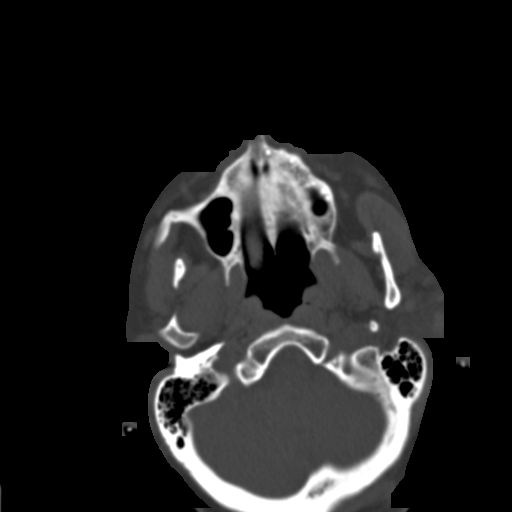
[im 43/96  brain]
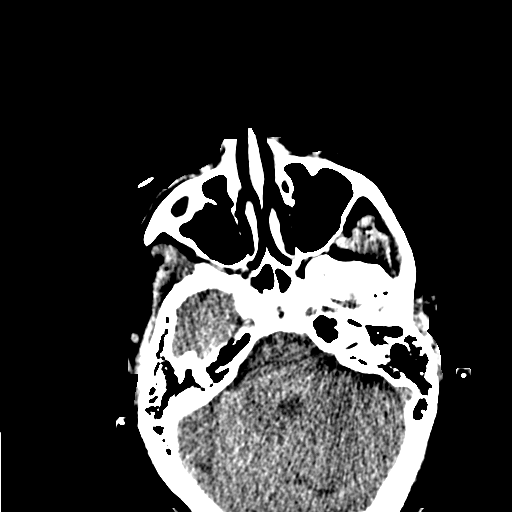
[im 43/96  bone]
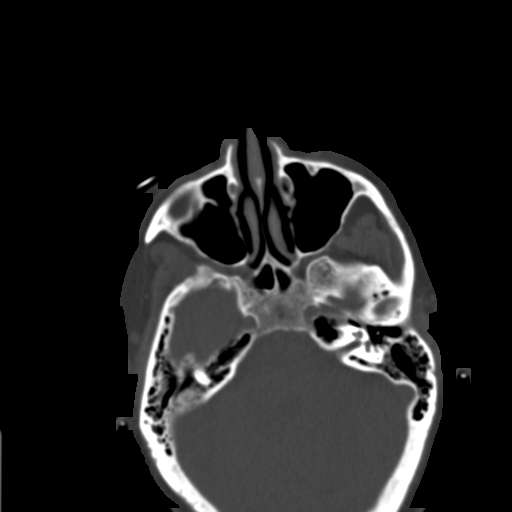
[im 53/96  bone]
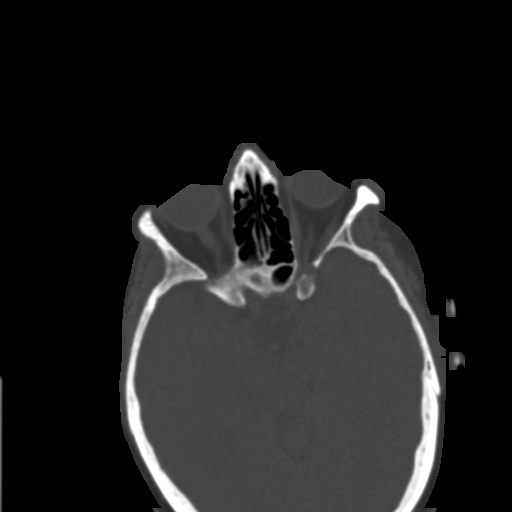
[im 63/96  bone]
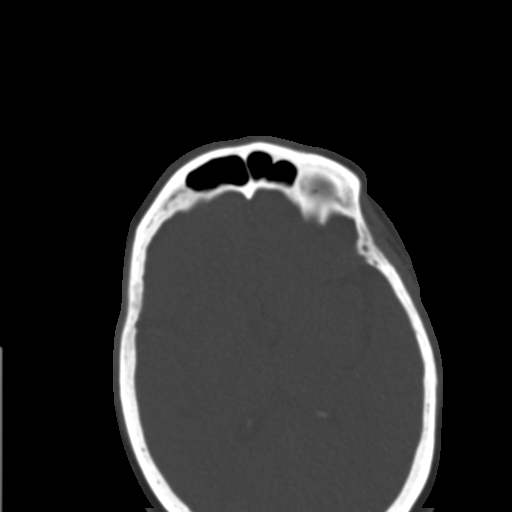
[im 73/96  bone]
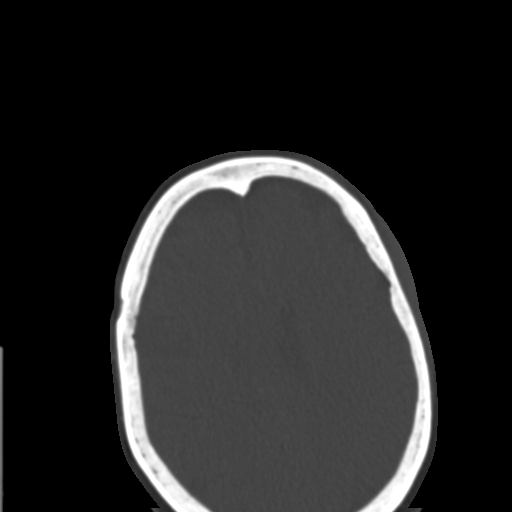
[im 79/96  brain]
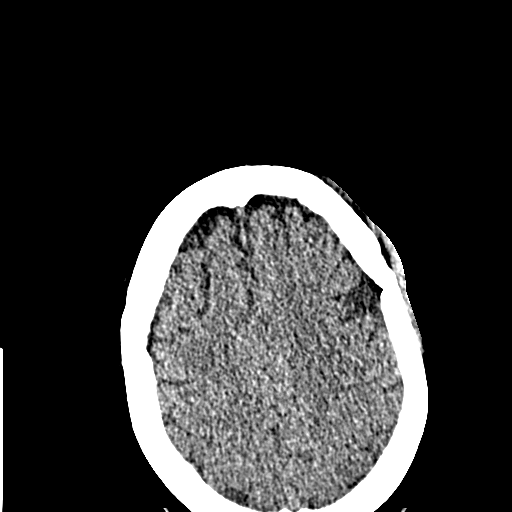
[im 79/96  bone]
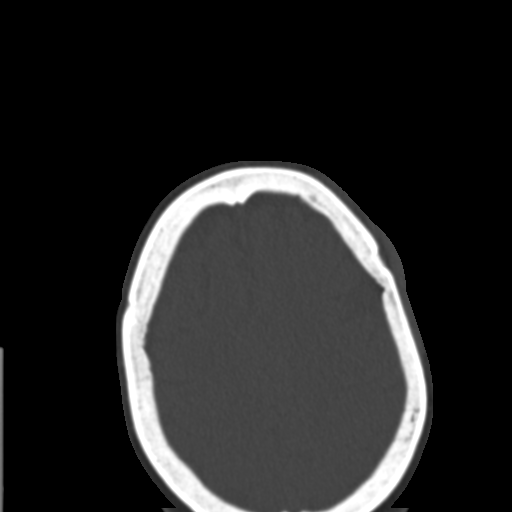
[im 89/96  bone]
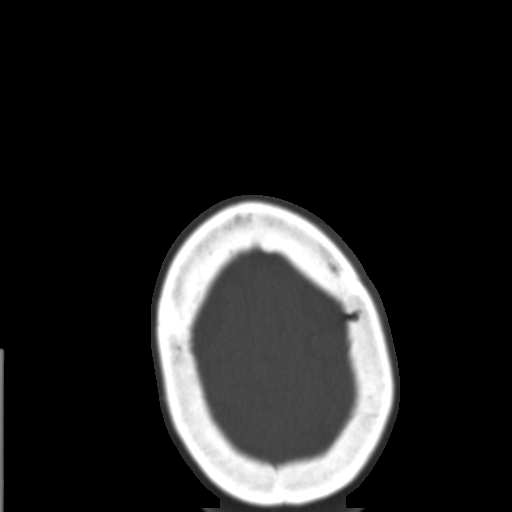

[Series 7: coronal soft · coronal · 0.39mm/px · 3 of 86 slices shown]
[im 29/86  bone]
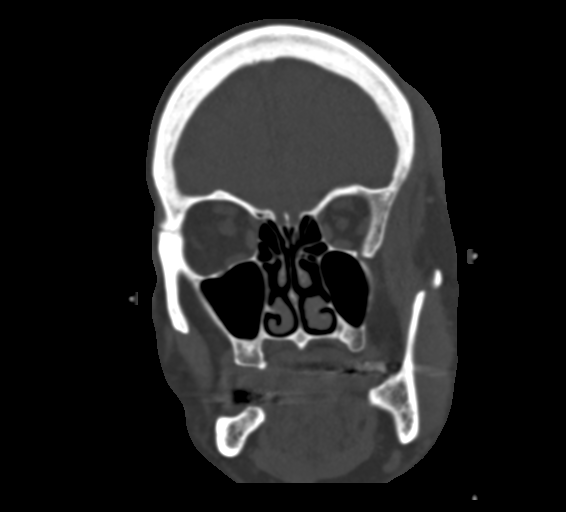
[im 38/86  bone]
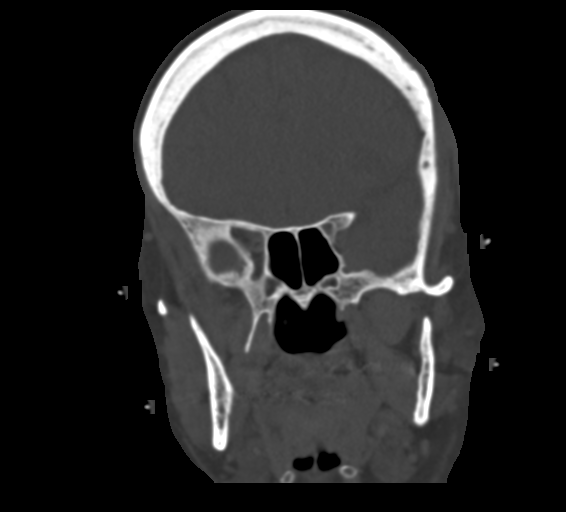
[im 48/86  bone]
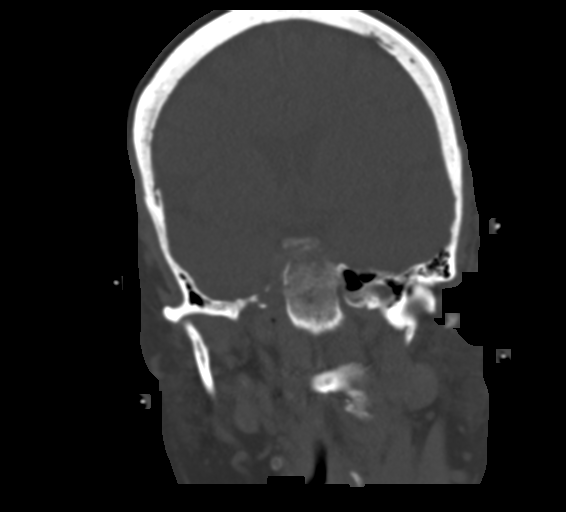

[Series 8: sagittal soft · sagittal · 0.45mm/px · 3 of 81 slices shown]
[im 27/81  bone]
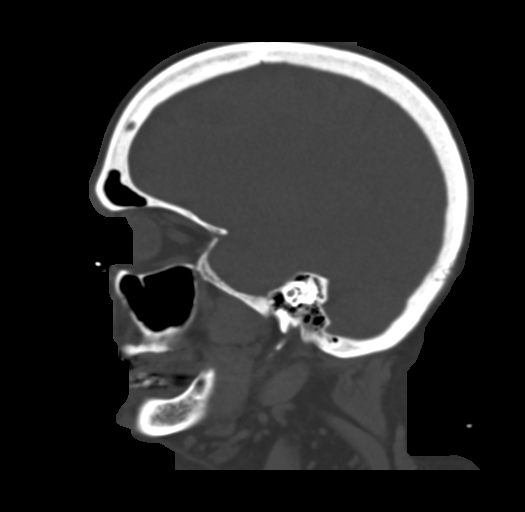
[im 41/81  bone]
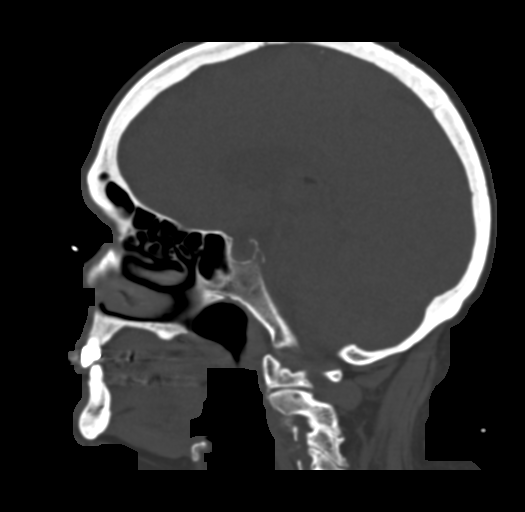
[im 54/81  bone]
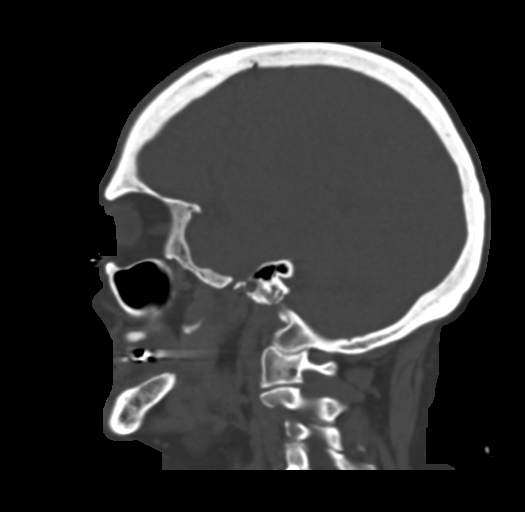

[16 of 47 positions shown; findings below may reference images not displayed]

FINDINGS: CT HEAD FINDINGS

Brain: No evidence of acute infarction, hemorrhage, hydrocephalus,
extra-axial collection or mass lesion/mass effect.

Vascular: No hyperdense vessel or unexpected calcification.

CT FACIAL BONES FINDINGS

Skull: Normal. Negative for fracture or focal lesion.

Facial bones: No displaced fractures or dislocations.

Sinuses/Orbits: No acute finding.

Other: Soft tissue hematoma about the left frontal scalp (series 3,
image 21).
IMPRESSION: 1. No acute intracranial pathology.
2. No displaced fracture or dislocation of the facial bones.
3. Soft tissue hematoma about the left frontal scalp.

## 2023-07-21 ENCOUNTER — Encounter: Payer: Self-pay | Admitting: Adult Health

## 2023-07-21 ENCOUNTER — Encounter: Payer: Self-pay | Admitting: Family Medicine

## 2023-07-21 NOTE — Telephone Encounter (Signed)
Please advise 

## 2023-07-22 ENCOUNTER — Other Ambulatory Visit: Payer: Self-pay | Admitting: Adult Health

## 2023-07-22 MED ORDER — AMPHETAMINE-DEXTROAMPHETAMINE 15 MG PO TABS: 15.0000 mg | ORAL_TABLET | Freq: Every day | ORAL | 0 refills | Status: DC

## 2023-07-22 NOTE — Telephone Encounter (Signed)
FYI

## 2023-07-22 NOTE — Addendum Note (Signed)
Addended by: Rodolph Bong on: 07/22/2023 01:17 PM   Modules accepted: Orders

## 2023-07-31 ENCOUNTER — Ambulatory Visit: Payer: Medicare Other

## 2023-08-06 ENCOUNTER — Other Ambulatory Visit: Payer: Self-pay | Admitting: Adult Health

## 2023-08-06 ENCOUNTER — Other Ambulatory Visit: Payer: Self-pay | Admitting: Family Medicine

## 2023-08-07 MED ORDER — AMPHETAMINE-DEXTROAMPHETAMINE 15 MG PO TABS: 15.0000 mg | ORAL_TABLET | Freq: Every day | ORAL | 0 refills | Status: DC

## 2023-08-31 ENCOUNTER — Other Ambulatory Visit: Payer: Self-pay | Admitting: Adult Health

## 2023-09-12 DIAGNOSIS — J0100 Acute maxillary sinusitis, unspecified: Secondary | ICD-10-CM | POA: Diagnosis not present

## 2023-09-14 ENCOUNTER — Other Ambulatory Visit: Payer: Self-pay | Admitting: Adult Health

## 2023-09-14 DIAGNOSIS — Z0000 Encounter for general adult medical examination without abnormal findings: Secondary | ICD-10-CM

## 2023-10-08 ENCOUNTER — Ambulatory Visit
Admission: RE | Admit: 2023-10-08 | Discharge: 2023-10-08 | Disposition: A | Discharge status: Home / Self Care | Payer: Medicare Other | Source: Ambulatory Visit | Attending: Adult Health | Admitting: Adult Health

## 2023-10-08 DIAGNOSIS — Z1231 Encounter for screening mammogram for malignant neoplasm of breast: Secondary | ICD-10-CM | POA: Diagnosis not present

## 2023-10-08 DIAGNOSIS — Z0000 Encounter for general adult medical examination without abnormal findings: Secondary | ICD-10-CM

## 2023-10-19 ENCOUNTER — Other Ambulatory Visit: Payer: Self-pay | Admitting: Family Medicine

## 2023-10-19 MED ORDER — AMPHETAMINE-DEXTROAMPHETAMINE 15 MG PO TABS: 15.0000 mg | ORAL_TABLET | Freq: Every day | ORAL | 0 refills | Status: DC

## 2023-10-21 DIAGNOSIS — H2513 Age-related nuclear cataract, bilateral: Secondary | ICD-10-CM | POA: Diagnosis not present

## 2023-10-21 DIAGNOSIS — H353134 Nonexudative age-related macular degeneration, bilateral, advanced atrophic with subfoveal involvement: Secondary | ICD-10-CM | POA: Diagnosis not present

## 2023-10-21 DIAGNOSIS — H52223 Regular astigmatism, bilateral: Secondary | ICD-10-CM | POA: Diagnosis not present

## 2023-11-20 ENCOUNTER — Encounter: Payer: Self-pay | Admitting: Adult Health

## 2023-11-20 ENCOUNTER — Other Ambulatory Visit: Payer: Self-pay | Admitting: Adult Health

## 2023-11-20 ENCOUNTER — Other Ambulatory Visit: Payer: Self-pay | Admitting: Family Medicine

## 2023-11-20 MED ORDER — AMPHETAMINE-DEXTROAMPHETAMINE 15 MG PO TABS: 15.0000 mg | ORAL_TABLET | Freq: Every day | ORAL | 0 refills | Status: DC

## 2023-11-23 MED ORDER — LEVOTHYROXINE SODIUM 75 MCG PO TABS: 75.0000 ug | ORAL_TABLET | Freq: Every day | ORAL | 0 refills | Status: DC

## 2023-11-29 ENCOUNTER — Other Ambulatory Visit: Payer: Self-pay | Admitting: Adult Health

## 2023-12-10 DIAGNOSIS — H353134 Nonexudative age-related macular degeneration, bilateral, advanced atrophic with subfoveal involvement: Secondary | ICD-10-CM | POA: Diagnosis not present

## 2023-12-11 ENCOUNTER — Other Ambulatory Visit: Payer: Self-pay | Admitting: Adult Health

## 2023-12-11 NOTE — Telephone Encounter (Signed)
Okay for refill?

## 2023-12-22 ENCOUNTER — Other Ambulatory Visit: Payer: Self-pay | Admitting: Family Medicine

## 2023-12-22 NOTE — Telephone Encounter (Signed)
Last OV 04/15/23 Next OV not scheduled  Last refill 11/20/23 Qty #30/0  Controlled substance, forwarding to Dr. Denyse Amass.

## 2023-12-22 NOTE — Telephone Encounter (Signed)
Pt calling for refill of Adderall to [facility] on [address].

## 2023-12-23 DIAGNOSIS — S60221A Contusion of right hand, initial encounter: Secondary | ICD-10-CM | POA: Diagnosis not present

## 2023-12-23 DIAGNOSIS — S8001XA Contusion of right knee, initial encounter: Secondary | ICD-10-CM | POA: Diagnosis not present

## 2023-12-23 MED ORDER — AMPHETAMINE-DEXTROAMPHETAMINE 15 MG PO TABS: 15.0000 mg | ORAL_TABLET | Freq: Every day | ORAL | 0 refills | Status: DC

## 2023-12-23 NOTE — Telephone Encounter (Signed)
 I prescribed this medicine today.

## 2023-12-24 ENCOUNTER — Ambulatory Visit: Payer: Medicare Other | Admitting: Adult Health

## 2023-12-26 ENCOUNTER — Other Ambulatory Visit: Payer: Self-pay | Admitting: Adult Health

## 2024-01-01 ENCOUNTER — Encounter: Payer: Self-pay | Admitting: Adult Health

## 2024-01-01 ENCOUNTER — Ambulatory Visit: Payer: Medicare Other | Admitting: Adult Health

## 2024-01-01 VITALS — BP 110/60 | HR 98 | Temp 98.7°F | Ht 60.0 in | Wt 181.0 lb

## 2024-01-01 DIAGNOSIS — M150 Primary generalized (osteo)arthritis: Secondary | ICD-10-CM | POA: Diagnosis not present

## 2024-01-01 DIAGNOSIS — E039 Hypothyroidism, unspecified: Secondary | ICD-10-CM

## 2024-01-01 DIAGNOSIS — I10 Essential (primary) hypertension: Secondary | ICD-10-CM

## 2024-01-01 DIAGNOSIS — H3530 Unspecified macular degeneration: Secondary | ICD-10-CM

## 2024-01-01 DIAGNOSIS — R7303 Prediabetes: Secondary | ICD-10-CM | POA: Diagnosis not present

## 2024-01-01 DIAGNOSIS — E782 Mixed hyperlipidemia: Secondary | ICD-10-CM | POA: Diagnosis not present

## 2024-01-01 DIAGNOSIS — Z0000 Encounter for general adult medical examination without abnormal findings: Secondary | ICD-10-CM

## 2024-01-01 DIAGNOSIS — F5101 Primary insomnia: Secondary | ICD-10-CM | POA: Diagnosis not present

## 2024-01-01 DIAGNOSIS — F0781 Postconcussional syndrome: Secondary | ICD-10-CM

## 2024-01-01 DIAGNOSIS — F32A Depression, unspecified: Secondary | ICD-10-CM

## 2024-01-01 LAB — COMPREHENSIVE METABOLIC PANEL
ALT: 17 U/L (ref 0–35)
AST: 24 U/L (ref 0–37)
Albumin: 4.1 g/dL (ref 3.5–5.2)
Alkaline Phosphatase: 81 U/L (ref 39–117)
BUN: 18 mg/dL (ref 6–23)
CO2: 29 meq/L (ref 19–32)
Calcium: 9.1 mg/dL (ref 8.4–10.5)
Chloride: 103 meq/L (ref 96–112)
Creatinine, Ser: 0.93 mg/dL (ref 0.40–1.20)
GFR: 60.01 mL/min (ref >60.00)
Glucose, Bld: 92 mg/dL (ref 70–99)
Potassium: 4.5 meq/L (ref 3.5–5.1)
Sodium: 139 meq/L (ref 135–145)
Total Bilirubin: 0.4 mg/dL (ref 0.2–1.2)
Total Protein: 7.5 g/dL (ref 6.0–8.3)

## 2024-01-01 LAB — LIPID PANEL
Cholesterol: 150 mg/dL (ref 0–200)
HDL: 55.5 mg/dL (ref >39.00)
LDL Cholesterol: 74 mg/dL (ref 0–99)
NonHDL: 94.26
Total CHOL/HDL Ratio: 3
Triglycerides: 100 mg/dL (ref 0.0–149.0)
VLDL: 20 mg/dL (ref 0.0–40.0)

## 2024-01-01 LAB — CBC WITH DIFFERENTIAL/PLATELET
Basophils Absolute: 0.1 10*3/uL (ref 0.0–0.1)
Basophils Relative: 0.9 % (ref 0.0–3.0)
Eosinophils Absolute: 0.2 10*3/uL (ref 0.0–0.7)
Eosinophils Relative: 3.2 % (ref 0.0–5.0)
HCT: 41.4 % (ref 36.0–46.0)
Hemoglobin: 13.9 g/dL (ref 12.0–15.0)
Lymphocytes Relative: 31 % (ref 12.0–46.0)
Lymphs Abs: 2.1 10*3/uL (ref 0.7–4.0)
MCHC: 33.6 g/dL (ref 30.0–36.0)
MCV: 92 fL (ref 78.0–100.0)
Monocytes Absolute: 0.7 10*3/uL (ref 0.1–1.0)
Monocytes Relative: 9.9 % (ref 3.0–12.0)
Neutro Abs: 3.8 10*3/uL (ref 1.4–7.7)
Neutrophils Relative %: 55 % (ref 43.0–77.0)
Platelets: 313 10*3/uL (ref 150.0–400.0)
RBC: 4.49 Mil/uL (ref 3.87–5.11)
RDW: 14.7 % (ref 11.5–15.5)
WBC: 6.9 10*3/uL (ref 4.0–10.5)

## 2024-01-01 LAB — HEMOGLOBIN A1C: Hgb A1c MFr Bld: 6 % (ref 4.6–6.5)

## 2024-01-01 LAB — TSH: TSH: 0.93 u[IU]/mL (ref 0.35–5.50)

## 2024-01-01 NOTE — Progress Notes (Signed)
Subjective:    Patient ID: [name], female    DOB: 01-02-1946, 78 y.o.   MRN: [id number]  HPI  Patient presents for yearly preventative medicine examination. She is a pleasant 78 year old female who  has a past medical history of Anxiety, Arthritis, Chronic back pain, Chronic kidney disease (2019), Depression, GERD (gastroesophageal reflux disease), Hyperlipidemia, Hypertension, Hypothyroidism, Macular degeneration of both eyes, Osteopenia, PONV (postoperative nausea and vomiting), and Prediabetes.  Hypertension -managed with lisinopril 10 mg daily.  She denies dizziness, lightheadedness, chest pain, or shortness of breath. BP Readings from Last 3 Encounters:  01/01/24 110/60  04/23/23 120/78  04/15/23 128/78   Hypothyroidism -managed with Synthroid 75 mcg daily. Lab Results  Component Value Date   TSH 0.76 04/22/2022   Insomnia -takes Xanax 0.25 mg nightly.   Prediabetes/glucose intolerance- Her insurance stopped paying for Ozempic. She has not been eating healthy and has been under a lot of stress with her husband passing away  Lab Results  Component Value Date   HGBA1C 6.0 04/22/2022   Depression -takes Cymbalta 30 mg daily.  He does feel well controlled on this medication  Macular degeneration-is managed by [facility]. She reports that her vision is progressed.   Hyperlipidemia - managed with simvastatin 5 mg daily. She denies myalgia or fatigue  Lab Results  Component Value Date   CHOL 202 (H) 04/22/2022   HDL 61.60 04/22/2022   LDLCALC 118 (H) 04/22/2022   TRIG 108.0 04/22/2022   CHOLHDL 3 04/22/2022   Post Concussion syndrome - prescribed Adderall 15 mg daily by Sports Medicine.   Osteoarthritis - uses Tramadol 50 mg as needed. This helps to some degree but does not rid her of pain. She is going to follow up with her orthopedics doctor  All immunizations and health maintenance protocols were reviewed with the patient and needed orders were  placed.  Appropriate screening laboratory values were ordered for the patient including screening of hyperlipidemia, renal function and hepatic function.  Medication reconciliation,  past medical history, social history, problem list and allergies were reviewed in detail with the patient  Goals were established with regard to weight loss, exercise, and  diet in compliance with medications  Wt Readings from Last 3 Encounters:  01/01/24 181 lb (82.1 kg)  04/23/23 177 lb (80.3 kg)  04/15/23 177 lb (80.3 kg)   She is up to date on mammograms and bone density screens  Review of Systems  Constitutional: Negative.   HENT: Negative.    Eyes:  Positive for visual disturbance.  Respiratory: Negative.    Cardiovascular: Negative.   Gastrointestinal: Negative.   Endocrine: Negative.   Genitourinary: Negative.   Musculoskeletal:  Positive for arthralgias and back pain.  Skin: Negative.   Allergic/Immunologic: Negative.   Neurological: Negative.   Hematological: Negative.   Psychiatric/Behavioral: Negative.     Past Medical History:  Diagnosis Date   Anxiety    Arthritis    Chronic back pain    Chronic kidney disease 2019   ARF while in hospital for PLIF   Depression    GERD (gastroesophageal reflux disease)    Hyperlipidemia    Hypertension    Hypothyroidism    Macular degeneration of both eyes    not age related   Osteopenia    PONV (postoperative nausea and vomiting)    also is slow to wake up   Prediabetes     Social History   Socioeconomic History   Marital status: Widowed  Spouse name: Not on file   Number of children: Not on file   Years of education: Not on file   Highest education level: 12th grade  Occupational History   Not on file  Tobacco Use   Smoking status: Never   Smokeless tobacco: Never  Vaping Use   Vaping status: Never Used  Substance and Sexual Activity   Alcohol use: Never   Drug use: Never   Sexual activity: Not on file  Other Topics  Concern   Not on file  Social History Narrative   Not on file   Social Drivers of Health   Financial Resource Strain: Low Risk  (12/31/2023)   Overall Financial Resource Strain (CARDIA)    Difficulty of Paying Living Expenses: Not very hard  Food Insecurity: No Food Insecurity (12/31/2023)   Hunger Vital Sign    Worried About Running Out of Food in the Last Year: Never true    Ran Out of Food in the Last Year: Never true  Transportation Needs: No Transportation Needs (12/31/2023)   PRAPARE - Administrator, Civil Service (Medical): No    Lack of Transportation (Non-Medical): No  Physical Activity: Unknown (12/31/2023)   Exercise Vital Sign    Days of Exercise per Week: 0 days    Minutes of Exercise per Session: Not on file  Stress: No Stress Concern Present (12/31/2023)   [employer] of Occupational Health - Occupational Stress Questionnaire    Feeling of Stress : Not at all  Social Connections: Moderately Isolated (12/31/2023)   Social Connection and Isolation Panel [NHANES]    Frequency of Communication with Friends and Family: More than three times a week    Frequency of Social Gatherings with Friends and Family: More than three times a week    Attends Religious Services: 1 to 4 times per year    Active Member of [employer] or Organizations: No    Attends Banker Meetings: Not on file    Marital Status: Widowed  Catering manager Violence: Not on file    Past Surgical History:  Procedure Laterality Date   ABDOMINAL HYSTERECTOMY     total   ANTERIOR CERVICAL DISCECTOMY     APPENDECTOMY     BACK SURGERY     PLIF   BUNIONECTOMY Right    CHOLECYSTECTOMY     COLONOSCOPY     HAND SURGERY Left    thumb joint, carpal tunnel and cyst   HAND SURGERY Right    thumb and carpal tunnel   POSTERIOR CERVICAL FUSION/FORAMINOTOMY      Family History  Problem Relation Age of Onset   Breast cancer Neg Hx     Allergies  Allergen Reactions   Celecoxib      Other reaction(s): itching   Citalopram Hydrobromide     Other reaction(s): nausea   Codeine Nausea Only and Other (See Comments)    Severe headache   Latex Itching    Other reaction(s): itching----GLOVES   Meperidine Hcl     Other reaction(s): itching   Morphine And Codeine Nausea And Vomiting    And headaches   Tape Itching   Trazodone Hcl     Other reaction(s): nightmares    Current Outpatient Medications on File Prior to Visit  Medication Sig Dispense Refill   acetaminophen (TYLENOL) 650 MG CR tablet Take 650 mg by mouth every 8 (eight) hours as needed for pain.     ALPRAZolam (XANAX) 0.5 MG tablet TAKE 1/2 TABLET BY  MOUTH EVERY NIGHT AT BEDTIME AS NEEDED FOR SLEEP 30 tablet 2   amphetamine-dextroamphetamine (ADDERALL) 15 MG tablet Take 1 tablet by mouth daily with breakfast. 30 tablet 0   Bacillus Coagulans-Inulin (PROBIOTIC) 1-250 BILLION-MG CAPS      cyclobenzaprine (FLEXERIL) 10 MG tablet Take 1 tablet (10 mg total) by mouth 3 (three) times daily as needed for muscle spasms. 30 tablet 0   DULoxetine (CYMBALTA) 30 MG capsule TAKE 1 CAPSULE BY MOUTH AT BEDTIME 90 capsule 1   fluticasone (FLONASE) 50 MCG/ACT nasal spray Place 2 sprays into both nostrils daily. 16 g 6   lansoprazole (PREVACID) 15 MG capsule TAKE 1 CAPSULE BY MOUTH DAILY AT NOON 90 capsule 0   levothyroxine (SYNTHROID) 75 MCG tablet TAKE 1 TABLET BY MOUTH DAILY BEFORE BREAKFAST 30 tablet 0   lisinopril (ZESTRIL) 10 MG tablet TAKE 1 TABLET BY MOUTH DAILY 90 tablet 0   Multiple Vitamin (MULTIVITAMIN WITH MINERALS) TABS tablet Take 1 tablet by mouth daily.     ondansetron (ZOFRAN) 4 MG tablet Take 1 tablet (4 mg total) by mouth every 8 (eight) hours as needed for nausea or vomiting. 20 tablet 2   simvastatin (ZOCOR) 5 MG tablet TAKE 1 TABLET BY MOUTH DAILY 90 tablet 3   tiZANidine (ZANAFLEX) 2 MG tablet Take 2 mg by mouth every 6 (six) hours as needed for muscle spasms.     traMADol (ULTRAM) 50 MG tablet TAKE 1 TABLET  BY MOUTH TWICE A DAY AS NEEDED 60 tablet 2   No current facility-administered medications on file prior to visit.    BP 110/60   Pulse 98   Temp 98.7 F (37.1 C) (Oral)   Ht 5' (1.524 m)   Wt 181 lb (82.1 kg)   SpO2 98%   BMI 35.35 kg/m       Objective:   Physical Exam Vitals and nursing note reviewed.  Constitutional:      General: She is not in acute distress.    Appearance: Normal appearance. She is obese. She is not ill-appearing.  HENT:     Head: Normocephalic and atraumatic.     Right Ear: Tympanic membrane, ear canal and external ear normal. There is no impacted cerumen.     Left Ear: Tympanic membrane, ear canal and external ear normal. There is no impacted cerumen.     Nose: Nose normal. No congestion or rhinorrhea.     Mouth/Throat:     Mouth: Mucous membranes are moist.     Pharynx: Oropharynx is clear.  Eyes:     Extraocular Movements: Extraocular movements intact.     Conjunctiva/sclera: Conjunctivae normal.     Pupils: Pupils are equal, round, and reactive to light.  Neck:     Vascular: No carotid bruit.  Cardiovascular:     Rate and Rhythm: Normal rate and regular rhythm.     Pulses: Normal pulses.     Heart sounds: No murmur heard.    No friction rub. No gallop.  Pulmonary:     Effort: Pulmonary effort is normal.     Breath sounds: Normal breath sounds.  Abdominal:     General: Abdomen is flat. Bowel sounds are normal. There is no distension.     Palpations: Abdomen is soft. There is no mass.     Tenderness: There is no abdominal tenderness. There is no guarding or rebound.     Hernia: No hernia is present.  Musculoskeletal:        General: Normal range  of motion.     Cervical back: Normal range of motion and neck supple.  Lymphadenopathy:     Cervical: No cervical adenopathy.  Skin:    General: Skin is warm and dry.     Capillary Refill: Capillary refill takes less than 2 seconds.  Neurological:     General: No focal deficit present.      Mental Status: She is alert and oriented to person, place, and time.  Psychiatric:        Mood and Affect: Mood normal.        Behavior: Behavior normal.        Thought Content: Thought content normal.        Judgment: Judgment normal.       Assessment & Plan:  1. Routine general medical examination at a health care facility (Primary) Today patient counseled on age appropriate routine health concerns for screening and prevention, each reviewed and up to date or declined. Immunizations reviewed and up to date or declined. Labs ordered and reviewed. Risk factors for depression reviewed and negative. Hearing function and visual acuity are intact. ADLs screened and addressed as needed. Functional ability and level of safety reviewed and appropriate. Education, counseling and referrals performed based on assessed risks today. Patient provided with a copy of personalized plan for preventive services. - Encouraged diet and exercise to help lose weight  - Follow up in one year or sooner if needed  2. Primary hypertension - Controlled. No change - CBC with Differential/Platelet; Future - Comprehensive metabolic panel; Future - Lipid panel; Future - TSH; Future - Hemoglobin A1c; Future  3. Acquired hypothyroidism - Consider dose change of synthroid  - CBC with Differential/Platelet; Future - Comprehensive metabolic panel; Future - Lipid panel; Future - TSH; Future - Hemoglobin A1c; Future  4. Primary insomnia - Continue with Xanax as needed - CBC with Differential/Platelet; Future - Comprehensive metabolic panel; Future - Lipid panel; Future - TSH; Future - Hemoglobin A1c; Future  5. Prediabetes - Consider Metformin  - CBC with Differential/Platelet; Future - Comprehensive metabolic panel; Future - Lipid panel; Future - TSH; Future - Hemoglobin A1c; Future  6. Depression, unspecified depression type - Controlled with Cymbalta. No change in therapy  - CBC with  Differential/Platelet; Future - Comprehensive metabolic panel; Future - Lipid panel; Future - TSH; Future - Hemoglobin A1c; Future  7. Macular degeneration of both eyes, unspecified type - Per [facility]   8. Mixed hyperlipidemia - Consider increase in statin  - CBC with Differential/Platelet; Future - Comprehensive metabolic panel; Future - Lipid panel; Future - TSH; Future - Hemoglobin A1c; Future  9. Post concussion syndrome - Continue with Adderall  - CBC with Differential/Platelet; Future - Comprehensive metabolic panel; Future - Lipid panel; Future - TSH; Future - Hemoglobin A1c; Future  10. Primary osteoarthritis involving multiple joints - Continue with Tramadol as needed  [name], NP

## 2024-01-05 ENCOUNTER — Other Ambulatory Visit: Payer: Self-pay | Admitting: Adult Health

## 2024-01-05 DIAGNOSIS — M150 Primary generalized (osteo)arthritis: Secondary | ICD-10-CM

## 2024-01-05 MED ORDER — DICLOFENAC SODIUM 75 MG PO TBEC: 75.0000 mg | DELAYED_RELEASE_TABLET | Freq: Two times a day (BID) | ORAL | 1 refills | Status: AC

## 2024-01-11 ENCOUNTER — Telehealth: Payer: Self-pay | Admitting: Adult Health

## 2024-01-11 NOTE — Telephone Encounter (Signed)
 Pt is returning [place] call from 01-08-2024

## 2024-01-12 NOTE — Telephone Encounter (Signed)
 Please see result note

## 2024-01-20 ENCOUNTER — Other Ambulatory Visit: Payer: Self-pay | Admitting: Adult Health

## 2024-01-25 ENCOUNTER — Other Ambulatory Visit: Payer: Self-pay | Admitting: Family Medicine

## 2024-01-26 NOTE — Telephone Encounter (Signed)
 Last OV 04/15/23 Next OV not scheduled   Last refill 12/23/23 Qty #30/0  Controlled substance, forwarding to Dr. Denyse Amass.

## 2024-01-27 MED ORDER — AMPHETAMINE-DEXTROAMPHETAMINE 15 MG PO TABS
15.0000 mg | ORAL_TABLET | Freq: Every day | ORAL | 0 refills | Status: DC
Start: 2024-01-27 — End: 2024-02-24

## 2024-01-27 NOTE — Telephone Encounter (Signed)
 Patient called requesting this refill as well.

## 2024-01-27 NOTE — Telephone Encounter (Signed)
 Last OV 04/15/23 Next OV not scheduled  Needs 1 year f/u May/June 2025.   Last refill 12/23/23 Qty #30/0  Controlled substance, forwarding to Dr. Denyse Amass.

## 2024-01-28 ENCOUNTER — Other Ambulatory Visit: Payer: Self-pay | Admitting: Adult Health

## 2024-02-02 ENCOUNTER — Encounter (INDEPENDENT_AMBULATORY_CARE_PROVIDER_SITE_OTHER): Admitting: Ophthalmology

## 2024-02-02 DIAGNOSIS — H353134 Nonexudative age-related macular degeneration, bilateral, advanced atrophic with subfoveal involvement: Secondary | ICD-10-CM | POA: Diagnosis not present

## 2024-02-02 DIAGNOSIS — H43813 Vitreous degeneration, bilateral: Secondary | ICD-10-CM | POA: Diagnosis not present

## 2024-02-02 DIAGNOSIS — I10 Essential (primary) hypertension: Secondary | ICD-10-CM

## 2024-02-02 DIAGNOSIS — H35033 Hypertensive retinopathy, bilateral: Secondary | ICD-10-CM | POA: Diagnosis not present

## 2024-02-18 DIAGNOSIS — Z981 Arthrodesis status: Secondary | ICD-10-CM | POA: Diagnosis not present

## 2024-02-18 DIAGNOSIS — M542 Cervicalgia: Secondary | ICD-10-CM | POA: Diagnosis not present

## 2024-02-24 ENCOUNTER — Other Ambulatory Visit: Payer: Self-pay | Admitting: Family Medicine

## 2024-02-24 ENCOUNTER — Other Ambulatory Visit: Payer: Self-pay | Admitting: Adult Health

## 2024-02-24 NOTE — Telephone Encounter (Signed)
 Last OV 04/15/23 Next OV not scheduled  Needs OV before next refill  Last refill 01/27/24 Qty # 30/0  Controlled substance, forwarding to Dr. Denyse Amass.

## 2024-02-24 NOTE — Telephone Encounter (Signed)
 Patient called requesting a refill on: amphetamine-dextroamphetamine (ADDERALL) 15 MG tablet   [facility]: [facility] [address]

## 2024-02-26 MED ORDER — AMPHETAMINE-DEXTROAMPHETAMINE 15 MG PO TABS: 15.0000 mg | ORAL_TABLET | Freq: Every day | ORAL | 0 refills | Status: DC

## 2024-03-10 ENCOUNTER — Encounter: Payer: Self-pay | Admitting: Family Medicine

## 2024-03-10 DIAGNOSIS — I700 Atherosclerosis of aorta: Secondary | ICD-10-CM

## 2024-03-16 NOTE — Telephone Encounter (Signed)
 Per Dr. Alease Hunter - Can you please elaborate on your concerns for the actual spine part of the x-ray?  Forwarding to Dr. Alease Hunter to review concerns from pt.

## 2024-03-22 ENCOUNTER — Other Ambulatory Visit: Payer: Self-pay | Admitting: Adult Health

## 2024-03-22 NOTE — Telephone Encounter (Signed)
 Copied from CRM [id number]. Topic: Clinical - Medication Refill >> Mar 22, 2024  3:11 PM Ovid Blow wrote: Most Recent Primary Care Visit:  Provider: [facility] Atta  Department: [facility]  Visit Type: PHYSICAL/AWV  Date: 01/01/2024  Medication: lisinopril  (ZESTRIL ) 10 MG tablet levothyroxine  (SYNTHROID ) 75 MCG tablet  Has the patient contacted their pharmacy? Yes (Agent: If no, request that the patient contact the pharmacy for the refill. If patient does not wish to contact the pharmacy document the reason why and proceed with request.) (Agent: If yes, when and what did the pharmacy advise?)  Is this the correct pharmacy for this prescription? Yes If no, delete pharmacy and type the correct one.  This is the patient's preferred pharmacy:  [facility] [id number] - [place], [place] - [address] [place] [address] Phone: [phone] Fax: [phone]   Has the prescription been filled recently? No  Is the patient out of the medication? Yes  Has the patient been seen for an appointment in the last year OR does the patient have an upcoming appointment? Yes  Can we respond through MyChart? Yes  Agent: Please be advised that Rx refills may take up to 3 business days. We ask that you follow-up with your pharmacy.

## 2024-03-23 DIAGNOSIS — M19031 Primary osteoarthritis, right wrist: Secondary | ICD-10-CM | POA: Diagnosis not present

## 2024-03-23 DIAGNOSIS — M65312 Trigger thumb, left thumb: Secondary | ICD-10-CM | POA: Diagnosis not present

## 2024-03-23 DIAGNOSIS — R200 Anesthesia of skin: Secondary | ICD-10-CM | POA: Diagnosis not present

## 2024-03-23 DIAGNOSIS — M47812 Spondylosis without myelopathy or radiculopathy, cervical region: Secondary | ICD-10-CM | POA: Diagnosis not present

## 2024-03-23 DIAGNOSIS — M79641 Pain in right hand: Secondary | ICD-10-CM | POA: Diagnosis not present

## 2024-03-23 DIAGNOSIS — R202 Paresthesia of skin: Secondary | ICD-10-CM | POA: Diagnosis not present

## 2024-03-23 DIAGNOSIS — R2232 Localized swelling, mass and lump, left upper limb: Secondary | ICD-10-CM | POA: Diagnosis not present

## 2024-03-23 DIAGNOSIS — M19032 Primary osteoarthritis, left wrist: Secondary | ICD-10-CM | POA: Diagnosis not present

## 2024-03-24 ENCOUNTER — Ambulatory Visit: Admitting: Family Medicine

## 2024-03-24 ENCOUNTER — Encounter: Payer: Self-pay | Admitting: Family Medicine

## 2024-03-24 VITALS — BP 112/78 | HR 103 | Ht 60.0 in | Wt 172.0 lb

## 2024-03-24 DIAGNOSIS — R202 Paresthesia of skin: Secondary | ICD-10-CM | POA: Diagnosis not present

## 2024-03-24 DIAGNOSIS — F0781 Postconcussional syndrome: Secondary | ICD-10-CM | POA: Diagnosis not present

## 2024-03-24 DIAGNOSIS — I700 Atherosclerosis of aorta: Secondary | ICD-10-CM | POA: Diagnosis not present

## 2024-03-24 DIAGNOSIS — S060X0A Concussion without loss of consciousness, initial encounter: Secondary | ICD-10-CM | POA: Insufficient documentation

## 2024-03-24 MED ORDER — AMPHETAMINE-DEXTROAMPHET ER 25 MG PO CP24: 25.0000 mg | ORAL_CAPSULE | ORAL | 0 refills | Status: DC

## 2024-03-24 NOTE — Progress Notes (Signed)
   I, Miquel Amen, CMA acting as a scribe for Garlan Juniper, MD.  [name] is a 78 y.o. female who presents to [facility] today for cont'd back pain. Pt was last seen by Dr. Alease Hunter on 04/15/23 and was switched to methylphenidate  and referred to PT. No visits were ever scheduled.  Today, pt reports continued lower back and neck pain. Dr. Dr. Aloha Arnold for hand pain yesterday, EMG /NCV ordered. Planning for MRI of the neck and back. Has upcoming scan for eval of aneurysm. Would like to discuss medication dose adjustments. Taking Tramadol  and Diclofenac  prn. Weary of NSAIDs d/t kidney failure during surgery. Takes Tylenol  prn. Notes that neck pain is mostly right sided radiating into the right arm and hand. Denies HA.    Additionally I am prescribing Adderall for postconcussion syndrome attention related issues.  She is taking 1.5 pills of Adderall immediate release 15 mg.  She notes this works okay.  She does not think it last long enough.  Dx imaging: 04/15/23 L-spine XR  Pertinent review of systems: No fevers or chills  Relevant historical information: Hypertension and hypothyroidism   Exam:  BP 112/78   Pulse (!) 103   Ht 5' (1.524 m)   Wt 172 lb (78 kg)   SpO2 98%   BMI 33.59 kg/m  General: Well Developed, well nourished, and in no acute distress.   Neuropsych: Alert and oriented      Assessment and Plan: 78 y.o. female with postconcussion syndrome.  Prescribing Adderall.  Plan to increase to Adderall XR 25 mg daily.  She will let me know if this medicine dose is inappropriate for her.  She is getting a nerve conduction study for her right arm with Dr. Aloha Arnold.  This makes sense.  If cervical radiculopathy is indicated if she will either need an MRI or CT myelogram.  Additionally lumbar spine x-ray last year showed aortic atherosclerosis.  She has a AAA Doppler ordered to evaluate for aortic aneurysm.  This is scheduled for the end of the  month.   PDMP reviewed during this encounter. No orders of the defined types were placed in this encounter.  Meds ordered this encounter  Medications   amphetamine -dextroamphetamine  (ADDERALL XR) 25 MG 24 hr capsule    Sig: Take 1 capsule by mouth every morning.    Dispense:  30 capsule    Refill:  0     Discussed warning signs or symptoms. Please see discharge instructions. Patient expresses understanding.   The above documentation has been reviewed and is accurate and complete Garlan Juniper, M.D. Total encounter time 30 minutes including face-to-face time with the patient and, reviewing past medical record, and charting on the date of service.

## 2024-03-24 NOTE — Patient Instructions (Addendum)
 Thank you for coming in today.   Let me know how this nerve study goes.   Please get ultrasound for that aneurysm.   Let me know how the new dose of Adderal xr works.

## 2024-03-29 ENCOUNTER — Encounter: Payer: Self-pay | Admitting: Family Medicine

## 2024-03-29 DIAGNOSIS — L570 Actinic keratosis: Secondary | ICD-10-CM | POA: Diagnosis not present

## 2024-03-29 DIAGNOSIS — R2232 Localized swelling, mass and lump, left upper limb: Secondary | ICD-10-CM | POA: Diagnosis not present

## 2024-03-31 MED ORDER — AMPHETAMINE-DEXTROAMPHETAMINE 20 MG PO TABS
20.0000 mg | ORAL_TABLET | Freq: Every day | ORAL | 0 refills | Status: DC
Start: 2024-03-31 — End: 2024-04-13

## 2024-04-11 ENCOUNTER — Encounter: Payer: Self-pay | Admitting: Family Medicine

## 2024-04-12 ENCOUNTER — Ambulatory Visit (HOSPITAL_COMMUNITY)
Admission: RE | Admit: 2024-04-12 | Discharge: 2024-04-12 | Disposition: A | Discharge status: Home / Self Care | Source: Ambulatory Visit | Attending: Family Medicine | Admitting: Family Medicine

## 2024-04-12 DIAGNOSIS — I700 Atherosclerosis of aorta: Secondary | ICD-10-CM | POA: Diagnosis not present

## 2024-04-13 ENCOUNTER — Ambulatory Visit: Payer: Self-pay | Admitting: Family Medicine

## 2024-04-13 MED ORDER — AMPHETAMINE-DEXTROAMPHETAMINE 30 MG PO TABS: 30.0000 mg | ORAL_TABLET | Freq: Every day | ORAL | 0 refills | Status: DC

## 2024-04-13 NOTE — Progress Notes (Signed)
 Aorta is normal in size on vascular ultrasound without aneurysm.

## 2024-04-15 ENCOUNTER — Encounter (INDEPENDENT_AMBULATORY_CARE_PROVIDER_SITE_OTHER): Payer: Medicare Other | Admitting: Ophthalmology

## 2024-04-18 NOTE — Telephone Encounter (Signed)
 Forwarding to Dr. Denyse Amass to review and advise.

## 2024-04-19 ENCOUNTER — Encounter: Payer: Self-pay | Admitting: Adult Health

## 2024-04-19 NOTE — Telephone Encounter (Signed)
 Forwarding to Dr. Denyse Amass to review and advise.

## 2024-04-19 NOTE — Telephone Encounter (Signed)
**Note De-identified  [place] Obfuscation** Please advise 

## 2024-04-21 NOTE — Telephone Encounter (Signed)
 Forwarding to Dr. Denyse Amass to review and advise.

## 2024-04-26 ENCOUNTER — Other Ambulatory Visit: Payer: Self-pay | Admitting: Adult Health

## 2024-04-26 ENCOUNTER — Encounter: Payer: Self-pay | Admitting: Adult Health

## 2024-04-27 NOTE — Telephone Encounter (Signed)
 Okay for refill?

## 2024-04-28 ENCOUNTER — Encounter: Payer: Self-pay | Admitting: Family Medicine

## 2024-04-28 ENCOUNTER — Ambulatory Visit: Admitting: Family Medicine

## 2024-04-28 DIAGNOSIS — Z0000 Encounter for general adult medical examination without abnormal findings: Secondary | ICD-10-CM | POA: Diagnosis not present

## 2024-04-28 NOTE — Progress Notes (Signed)
 Patient unable to obtain vital signs due to telehealth visit

## 2024-04-28 NOTE — Patient Instructions (Signed)
 I really enjoyed getting to talk with you today! I am available on Tuesdays and Thursdays for virtual visits if you have any questions or concerns, or if I can be of any further assistance.   CHECKLIST FROM ANNUAL WELLNESS VISIT:  -Follow up (please call to schedule if not scheduled after visit):   -yearly for annual wellness visit with primary care office  Here is a list of your preventive care/health maintenance measures and the plan for each if any are due:  PLAN For any measures below that may be due:   Health Maintenance  Topic Date Due   Zoster Vaccines- Shingrix (2 of 2) 09/18/2017   COVID-19 Vaccine (5 - 2024-25 season) 02/04/2024   Medicare Annual Wellness (AWV)  02/10/2024   INFLUENZA VACCINE  06/17/2024   DTaP/Tdap/Td (3 - Td or Tdap) 02/03/2027   Pneumococcal Vaccine: 50+ Years  Completed   DEXA SCAN  Completed   Hepatitis C Screening  Completed   HPV VACCINES  Aged Out   Meningococcal B Vaccine  Aged Out    -See a dentist at least yearly  -Get your eyes checked and then per your eye specialist's recommendations  -Other issues addressed today:   -I have included below further information regarding a healthy whole foods based diet, physical activity guidelines for adults, stress management and opportunities for social connections. I hope you find this information useful.   -----------------------------------------------------------------------------------------------------------------------------------------------------------------------------------------------------------------------------------------------------------    NUTRITION: -eat real food: lots of colorful vegetables (half the plate) and fruits -5-7 servings of vegetables and fruits per day (fresh or steamed is best), exp. 2 servings of vegetables with lunch and dinner and 2 servings of fruit per day. Berries and greens such as kale and collards are great choices.  -consume on a regular basis:  fresh  fruits, fresh veggies, fish, nuts, seeds, healthy oils (such as olive oil, avocado oil), whole grains (make sure for bread/pasta/crackers/etc., that the first ingredient on label contains the word whole), legumes. -can eat small amounts of dairy and lean meat (no larger than the palm of your hand), but avoid processed meats such as ham, bacon, lunch meat, etc. -drink water -try to avoid fast food and pre-packaged foods, processed meat, ultra processed foods/beverages (donuts, candy, etc.) -most experts advise limiting sodium to < 2300mg  per day, should limit further is any chronic conditions such as high blood pressure, heart disease, diabetes, etc. The American Heart Association advised that < 1500mg  is is ideal -try to avoid foods/beverages that contain any ingredients with names you do not recognize  -try to avoid foods/beverages  with added sugar or sweeteners/sweets  -try to avoid sweet drinks (including diet drinks): soda, juice, Gatorade, sweet tea, power drinks, diet drinks -try to avoid white rice, white bread, pasta (unless whole grain)  EXERCISE GUIDELINES FOR ADULTS: -if you wish to increase your physical activity, do so gradually and with the approval of your doctor -STOP and seek medical care immediately if you have any chest pain, chest discomfort or trouble breathing when starting or increasing exercise  -move and stretch your body, legs, feet and arms when sitting for long periods -Physical activity guidelines for optimal health in adults: -get at least 150 minutes per week of moderate exercise (can talk, but not sing); this is about 20-30 minutes of sustained activity 5-7 days per week or two 10-15 minute episodes of sustained activity 5-7 days per week -do some muscle building/resistance training/strength training at least 2 days per week  -balance exercises 3+ days  per week:   Stand somewhere where you have something sturdy to hold onto if you lose balance    1) lift up on  toes, then back down, start with 5x per day and work up to 20x   2) stand and lift one leg straight out to the side so that foot is a few inches of the floor, start with 5x each side and work up to 20x each side   3) stand on one foot, start with 5 seconds each side and work up to 20 seconds on each side  If you need ideas or help with getting more active:  -Silver sneakers https://tools.silversneakers.com  -Walk with a Doc: http://www.duncan-williams.com/  -try to include resistance (weight lifting/strength building) and balance exercises twice per week: or the following link for ideas: http://castillo-powell.com/  BuyDucts.dk  STRESS MANAGEMENT: -can try meditating, or just sitting quietly with deep breathing while intentionally relaxing all parts of your body for 5 minutes daily -if you need further help with stress, anxiety or depression please follow up with your primary doctor or contact the wonderful folks at [facility]: [phone]  SOCIAL CONNECTIONS: -options in [place] if you wish to engage in more social and exercise related activities:  -Silver sneakers https://tools.silversneakers.com  -Walk with a Doc: http://www.duncan-williams.com/  -Check out the [facility] Active Adults 50+ section on the [place] of [employer] (hiking clubs, book clubs, cards and games, chess, exercise classes, aquatic classes and much more) - see the website for details: https://www.[place].gov/departments/parks-recreation/active-adults50  -YouTube has lots of exercise videos for different ages and abilities as well  -[facility] (a variety of indoor and outdoor inperson activities for adults). (808)128-7709. [address]  -Virtual Online Classes (a variety of topics): see seniorplanet.org or call [phone]  -consider volunteering at a school, hospice center,  church, senior center or elsewhere

## 2024-04-28 NOTE — Progress Notes (Signed)
 PATIENT CHECK-IN and HEALTH RISK ASSESSMENT QUESTIONNAIRE:  -completed by phone/video for upcoming Medicare Preventive Visit  Pre-Visit Check-in: 1)Vitals (height, wt, BP, etc) - record in vitals section for visit on day of visit Request home vitals (wt, BP, etc.) and enter into vitals, THEN update Vital Signs SmartPhrase below at the top of the HPI. See below.  2)Review and Update Medications, Allergies PMH, Surgeries, Social history in Epic 3)Hospitalizations in the last year with date/reason? no  4)Review and Update Care Team (patient's specialists) in Epic 5) Complete PHQ9 in Epic  6) Complete Fall Screening in Epic 7)Review all Health Maintenance Due and order under PCP if not done.  Medicare Wellness Patient Questionnaire:  Answer theses question about your habits: How often do you have a drink containing alcohol?n/a How many drinks containing alcohol do you have on a typical day when you are drinking?n/a How often do you have six or more drinks on one occasion?n/a Have you ever smoked?no  Quit date if applicable? N/a  How many packs a day do/did you smoke? N/a Do you use smokeless tobacco?no Do you use an illicit drugs? no On average, how many days per week do you engage in moderate to strenuous exercise (like a brisk walk)? None currently On average, how many minutes do you engage in exercise at this level?none - but plans to start, has had PT. Are you sexually active? No Number of partners?n/a Typical breakfast: Varies - home cooked meals, spinach, blueberry shakes Typical lunch: Varies  Typical dinner: Varies  Typical snacks: Sweets - admits a weakness with s ugar but reports is doing better  Beverages: 1 small Soda daily, water, Juice, Tea - unsweet Has 4 dogs, stays busy Sees sister 2-3 times per week, good social connections, sister takes her to store and any appts, kids and granddaughter living with her and 2 grandchildren live with her  Answer theses question about  your everyday activities: Can you perform most household chores? yes Are you deaf or have significant trouble hearing? no Do you feel that you have a problem with memory? yes Do you feel safe at home?yes Last dentist visit? Next week  8. Do you have any difficulty performing your everyday activities?yes Are you having any difficulty walking, taking medications on your own, and or difficulty managing daily home needs?yes Do you have difficulty walking or climbing stairs?yes Do you have difficulty dressing or bathing? no Do you have difficulty doing errands alone such as visiting a doctor's office or shopping? no Do you currently have any difficulty preparing food and eating?no Do you currently have any difficulty using the toilet?no Do you have any difficulty managing your finances?no Do you have any difficulties with housekeeping of managing your housekeeping? no   Do you have Advanced Directives in place (Living Will, Healthcare Power or Attorney)? Yes    Last eye Exam and location? Dr. Augustus Ledger, 02/25    Do you currently use prescribed or non-prescribed narcotic or opioid pain medications? yes  Do you have a history or close family history of breast, ovarian, tubal or peritoneal cancer or a family member with BRCA (breast cancer susceptibility 1 and 2) gene mutations? no   Nurse/Assistant Credentials/time stamp: mg 3:12    ----------------------------------------------------------------------------------------------------------------------------------------------------------------------------------------------------------------------  Because this visit was a virtual/telehealth visit, some criteria may be missing or patient reported. Any vitals not documented were not able to be obtained and vitals that have been documented are patient reported.    MEDICARE ANNUAL PREVENTIVE VISIT WITH PROVIDER: (Welcome  to Medicare, initial annual wellness or annual wellness exam)  Virtual  Visit via Video Note  I connected with [name] on 04/28/24 by a video enabled telemedicine application and verified that I am speaking with the correct person using two identifiers.  Location patient: home Location provider:work or home office Persons participating in the virtual visit: patient, provider  Concerns and/or follow up today: doing well   See HM section in Epic for other details of completed HM.    ROS: negative for report of fevers, unintentional weight loss, vision changes, vision loss, hearing loss or change, chest pain, sob, hemoptysis, melena, hematochezia, hematuria, falls, bleeding or bruising, thoughts of suicide or self harm, memory loss  Patient-completed extensive health risk assessment - reviewed and discussed with the patient: See Health Risk Assessment completed with patient prior to the visit either above or in recent phone note. This was reviewed in detailed with the patient today and appropriate recommendations, orders and referrals were placed as needed per Summary below and patient instructions.   Review of Medical History: -PMH, PSH, Family History and current specialty and care providers reviewed and updated and listed below   Patient Care Team: Alto Atta, NP as PCP - General (Family Medicine) Carnell Christian, [facility] (Pharmacist)   Past Medical History:  Diagnosis Date   Anxiety    Arthritis    Chronic back pain    Chronic kidney disease 2019   ARF while in hospital for PLIF   Depression    GERD (gastroesophageal reflux disease)    Hyperlipidemia    Hypertension    Hypothyroidism    Macular degeneration of both eyes    not age related   Osteopenia    PONV (postoperative nausea and vomiting)    also is slow to wake up   Prediabetes     Past Surgical History:  Procedure Laterality Date   ABDOMINAL HYSTERECTOMY     total   ANTERIOR CERVICAL DISCECTOMY     APPENDECTOMY     BACK SURGERY     PLIF   BUNIONECTOMY Right     CHOLECYSTECTOMY     COLONOSCOPY     HAND SURGERY Left    thumb joint, carpal tunnel and cyst   HAND SURGERY Right    thumb and carpal tunnel   POSTERIOR CERVICAL FUSION/FORAMINOTOMY      Social History   Socioeconomic History   Marital status: Widowed    Spouse name: Not on file   Number of children: Not on file   Years of education: Not on file   Highest education level: 12th grade  Occupational History   Not on file  Tobacco Use   Smoking status: Never   Smokeless tobacco: Never  Vaping Use   Vaping status: Never Used  Substance and Sexual Activity   Alcohol use: Never   Drug use: Never   Sexual activity: Not on file  Other Topics Concern   Not on file  Social History Narrative   Not on file   Social Drivers of Health   Financial Resource Strain: Low Risk  (04/28/2024)   Overall Financial Resource Strain (CARDIA)    Difficulty of Paying Living Expenses: Not very hard  Food Insecurity: No Food Insecurity (04/28/2024)   Hunger Vital Sign    Worried About Running Out of Food in the Last Year: Never true    Ran Out of Food in the Last Year: Never true  Transportation Needs: No Transportation Needs (04/28/2024)   PRAPARE -  Administrator, Civil Service (Medical): No    Lack of Transportation (Non-Medical): No  Physical Activity: Inactive (04/28/2024)   Exercise Vital Sign    Days of Exercise per Week: 0 days    Minutes of Exercise per Session: 0 min  Stress: No Stress Concern Present (04/28/2024)   [employer] of Occupational Health - Occupational Stress Questionnaire    Feeling of Stress: Not at all  Social Connections: Moderately Isolated (04/28/2024)   Social Connection and Isolation Panel    Frequency of Communication with Friends and Family: More than three times a week    Frequency of Social Gatherings with Friends and Family: More than three times a week    Attends Religious Services: 1 to 4 times per year    Active Member of [employer] or  Organizations: No    Attends Banker Meetings: Not on file    Marital Status: Widowed  Catering manager Violence: Not on file    Family History  Problem Relation Age of Onset   Breast cancer Neg Hx     Current Outpatient Medications on File Prior to Visit  Medication Sig Dispense Refill   acetaminophen  (TYLENOL ) 650 MG CR tablet Take 650 mg by mouth every 8 (eight) hours as needed for pain.     ALPRAZolam  (XANAX ) 0.5 MG tablet TAKE 1/2 TABLET BY MOUTH EVERY NIGHT AT BEDTIME AS NEEDED FOR SLEEP 30 tablet 2   amphetamine -dextroamphetamine  (ADDERALL) 30 MG tablet Take 1 tablet by mouth daily. 30 tablet 0   Bacillus Coagulans-Inulin (PROBIOTIC) 1-250 BILLION-MG CAPS      cyclobenzaprine  (FLEXERIL ) 10 MG tablet Take 1 tablet (10 mg total) by mouth 3 (three) times daily as needed for muscle spasms. 30 tablet 0   DULoxetine  (CYMBALTA ) 30 MG capsule TAKE 1 CAPSULE BY MOUTH AT BEDTIME 90 capsule 1   fluticasone  (FLONASE ) 50 MCG/ACT nasal spray Place 2 sprays into both nostrils daily. 16 g 6   lansoprazole  (PREVACID ) 15 MG capsule TAKE 1 CAPSULE BY MOUTH DAILY AT NOON 90 capsule 0   levothyroxine  (SYNTHROID ) 75 MCG tablet TAKE 1 TABLET BY MOUTH DAILY BEFORE BREAKFAST 90 tablet 3   lisinopril  (ZESTRIL ) 10 MG tablet TAKE 1 TABLET BY MOUTH DAILY 90 tablet 0   Multiple Vitamin (MULTIVITAMIN WITH MINERALS) TABS tablet Take 1 tablet by mouth daily.     ondansetron  (ZOFRAN ) 4 MG tablet Take 1 tablet (4 mg total) by mouth every 8 (eight) hours as needed for nausea or vomiting. 20 tablet 2   simvastatin  (ZOCOR ) 5 MG tablet TAKE 1 TABLET BY MOUTH DAILY 90 tablet 3   tiZANidine (ZANAFLEX) 2 MG tablet Take 2 mg by mouth every 6 (six) hours as needed for muscle spasms.     traMADol  (ULTRAM ) 50 MG tablet TAKE 1 TABLET BY MOUTH TWICE A DAY AS NEEDED 60 tablet 2   No current facility-administered medications on file prior to visit.    Allergies  Allergen Reactions   Celecoxib      Other  reaction(s): itching   Citalopram Hydrobromide     Other reaction(s): nausea   Codeine Nausea Only and Other (See Comments)    Severe headache   Latex Itching    Other reaction(s): itching----GLOVES   Meperidine Hcl     Other reaction(s): itching   Morphine  And Codeine Nausea And Vomiting    And headaches   Tape Itching   Trazodone Hcl     Other reaction(s): nightmares  Physical Exam Vitals requested from patient and listed below if patient had equipment and was able to obtain at home for this virtual visit: There were no vitals filed for this visit. Estimated body mass index is 33.59 kg/m as calculated from the following:   Height as of 03/24/24: 5' (1.524 m).   Weight as of 03/24/24: 172 lb (78 kg).  EKG (optional): deferred due to virtual visit  GENERAL: alert, oriented, no acute distress detected, full vision exam deferred due to pandemic and/or virtual encounter  HEENT: atraumatic, conjunttiva clear, no obvious abnormalities on inspection of external nose and ears  NECK: normal movements of the head and neck  LUNGS: on inspection no signs of respiratory distress, breathing rate appears normal, no obvious gross SOB, gasping or wheezing  CV: no obvious cyanosis  MS: moves all visible extremities without noticeable abnormality  PSYCH/NEURO: pleasant and cooperative, no obvious depression or anxiety, speech and thought processing grossly intact, Cognitive function grossly intact  Flowsheet Row Clinical Support from 04/28/2024 in [facility] [place]  PHQ-9 Total Score 1        04/28/2024    2:58 PM 02/10/2023    9:29 AM 04/22/2022   11:15 AM 02/03/2022    9:54 AM  Depression screen PHQ 2/9  Decreased Interest 0 0 0 0  Down, Depressed, Hopeless 0 0 0 0  PHQ - 2 Score 0 0 0 0  Altered sleeping 0 0 0   Tired, decreased energy 1 3 1    Change in appetite 0 0 0   Feeling bad or failure about yourself  0 0 0   Trouble concentrating 0 0 0    Moving slowly or fidgety/restless 0 0 0   Suicidal thoughts 0 0 0   PHQ-9 Score 1 3 1    Difficult doing work/chores Not difficult at all  Not difficult at all        02/03/2022    9:53 AM 04/22/2022   11:14 AM 02/10/2023    9:26 AM 04/22/2023   11:38 AM 04/28/2024    3:01 PM  Fall Risk  Falls in the past year? 0 1 1 1 1   Was there an injury with Fall?  1 1 1 1   Fall Risk Category Calculator  2 3 3 3   Fall Risk Category (Retired)  Moderate      (RETIRED) Patient Fall Risk Level Low fall risk       Patient at Risk for Falls Due to Medication side effect History of fall(s) History of fall(s)  No Fall Risks  Fall risk Follow up Falls evaluation completed;Education provided;Falls prevention discussed  Falls evaluation completed  Falls evaluation completed  Falls evaluation completed     Data saved with a previous flowsheet row definition   Has had several falls. Seeing sports medicine and orthopedics. Did PT to help with balance.   SUMMARY AND PLAN:  Encounter for Medicare annual wellness exam    Discussed applicable health maintenance/preventive health measures and advised and referred or ordered per patient preferences: -discussed vaccines due Health Maintenance  Topic Date Due   Zoster Vaccines- Shingrix (2 of 2) 09/18/2017   COVID-19 Vaccine (5 - 2024-25 season) 02/04/2024   INFLUENZA VACCINE  06/17/2024   Medicare Annual Wellness (AWV)  04/28/2025   DTaP/Tdap/Td (3 - Td or Tdap) 02/03/2027   Pneumococcal Vaccine: 50+ Years  Completed   DEXA SCAN  Completed   Hepatitis C Screening  Completed   HPV VACCINES  Aged  Out   Meningococcal B Vaccine  Aged Out      Education and counseling on the following was provided based on the above review of health and a plan/checklist for the patient, along with additional information discussed, was provided for the patient in the patient instructions :   -Provided counseling and plan for increased risk of falling if applicable per above  screening. Reviewed and demonstrated safe balance exercises that can be done at home to improve balance and discussed exercise guidelines for adults with include balance exercises at least 3 days per week.  -Advised and counseled on a healthy lifestyle - including the importance of a healthy diet, regular physical activity, social connections  -Reviewed patient's current diet. Advised and counseled on a whole foods based healthy diet. A summary of a healthy diet was provided in the Patient Instructions.  -reviewed patient's current physical activity level and discussed exercise guidelines for adults. Discussed community resources and ideas for safe exercise at home to assist in meeting exercise guideline recommendations in a safe and healthy way.  -Advise yearly dental visits at minimum and regular eye exams   Follow up: see patient instructions     Patient Instructions  I really enjoyed getting to talk with you today! I am available on Tuesdays and Thursdays for virtual visits if you have any questions or concerns, or if I can be of any further assistance.   CHECKLIST FROM ANNUAL WELLNESS VISIT:  -Follow up (please call to schedule if not scheduled after visit):   -yearly for annual wellness visit with primary care office  Here is a list of your preventive care/health maintenance measures and the plan for each if any are due:  PLAN For any measures below that may be due:   Health Maintenance  Topic Date Due   Zoster Vaccines- Shingrix (2 of 2) 09/18/2017   COVID-19 Vaccine (5 - 2024-25 season) 02/04/2024   Medicare Annual Wellness (AWV)  02/10/2024   INFLUENZA VACCINE  06/17/2024   DTaP/Tdap/Td (3 - Td or Tdap) 02/03/2027   Pneumococcal Vaccine: 50+ Years  Completed   DEXA SCAN  Completed   Hepatitis C Screening  Completed   HPV VACCINES  Aged Out   Meningococcal B Vaccine  Aged Out    -See a dentist at least yearly  -Get your eyes checked and then per your eye specialist's  recommendations  -Other issues addressed today:   -I have included below further information regarding a healthy whole foods based diet, physical activity guidelines for adults, stress management and opportunities for social connections. I hope you find this information useful.   -----------------------------------------------------------------------------------------------------------------------------------------------------------------------------------------------------------------------------------------------------------    NUTRITION: -eat real food: lots of colorful vegetables (half the plate) and fruits -5-7 servings of vegetables and fruits per day (fresh or steamed is best), exp. 2 servings of vegetables with lunch and dinner and 2 servings of fruit per day. Berries and greens such as kale and collards are great choices.  -consume on a regular basis:  fresh fruits, fresh veggies, fish, nuts, seeds, healthy oils (such as olive oil, avocado oil), whole grains (make sure for bread/pasta/crackers/etc., that the first ingredient on label contains the word whole), legumes. -can eat small amounts of dairy and lean meat (no larger than the palm of your hand), but avoid processed meats such as ham, bacon, lunch meat, etc. -drink water -try to avoid fast food and pre-packaged foods, processed meat, ultra processed foods/beverages (donuts, candy, etc.) -most experts advise limiting sodium to < 2300mg   per day, should limit further is any chronic conditions such as high blood pressure, heart disease, diabetes, etc. The American Heart Association advised that < 1500mg  is is ideal -try to avoid foods/beverages that contain any ingredients with names you do not recognize  -try to avoid foods/beverages  with added sugar or sweeteners/sweets  -try to avoid sweet drinks (including diet drinks): soda, juice, Gatorade, sweet tea, power drinks, diet drinks -try to avoid white rice, white bread, pasta  (unless whole grain)  EXERCISE GUIDELINES FOR ADULTS: -if you wish to increase your physical activity, do so gradually and with the approval of your doctor -STOP and seek medical care immediately if you have any chest pain, chest discomfort or trouble breathing when starting or increasing exercise  -move and stretch your body, legs, feet and arms when sitting for long periods -Physical activity guidelines for optimal health in adults: -get at least 150 minutes per week of moderate exercise (can talk, but not sing); this is about 20-30 minutes of sustained activity 5-7 days per week or two 10-15 minute episodes of sustained activity 5-7 days per week -do some muscle building/resistance training/strength training at least 2 days per week  -balance exercises 3+ days per week:   Stand somewhere where you have something sturdy to hold onto if you lose balance    1) lift up on toes, then back down, start with 5x per day and work up to 20x   2) stand and lift one leg straight out to the side so that foot is a few inches of the floor, start with 5x each side and work up to 20x each side   3) stand on one foot, start with 5 seconds each side and work up to 20 seconds on each side  If you need ideas or help with getting more active:  -Silver sneakers https://tools.silversneakers.com  -Walk with a Doc: http://www.duncan-williams.com/  -try to include resistance (weight lifting/strength building) and balance exercises twice per week: or the following link for ideas: http://castillo-powell.com/  BuyDucts.dk  STRESS MANAGEMENT: -can try meditating, or just sitting quietly with deep breathing while intentionally relaxing all parts of your body for 5 minutes daily -if you need further help with stress, anxiety or depression please follow up with your primary doctor or contact the wonderful folks at [facility]: [phone]  SOCIAL CONNECTIONS: -options in [place] if you wish to engage in more social and exercise related activities:  -Silver sneakers https://tools.silversneakers.com  -Walk with a Doc: http://www.duncan-williams.com/  -Check out the [facility] Active Adults 50+ section on the [place] of [employer] (hiking clubs, book clubs, cards and games, chess, exercise classes, aquatic classes and much more) - see the website for details: https://www.[place].gov/departments/parks-recreation/active-adults50  -YouTube has lots of exercise videos for different ages and abilities as well  -[facility] (a variety of indoor and outdoor inperson activities for adults). [phone]. [address]  -Virtual Online Classes (a variety of topics): see seniorplanet.org or call [phone]  -consider volunteering at a school, hospice center, church, senior center or elsewhere            Maurie Southern, DO

## 2024-05-06 DIAGNOSIS — G5601 Carpal tunnel syndrome, right upper limb: Secondary | ICD-10-CM | POA: Diagnosis not present

## 2024-05-09 DIAGNOSIS — G5601 Carpal tunnel syndrome, right upper limb: Secondary | ICD-10-CM | POA: Diagnosis not present

## 2024-05-09 DIAGNOSIS — M47812 Spondylosis without myelopathy or radiculopathy, cervical region: Secondary | ICD-10-CM | POA: Diagnosis not present

## 2024-05-09 DIAGNOSIS — R2232 Localized swelling, mass and lump, left upper limb: Secondary | ICD-10-CM | POA: Diagnosis not present

## 2024-05-19 ENCOUNTER — Encounter: Payer: Self-pay | Admitting: Family Medicine

## 2024-05-19 ENCOUNTER — Other Ambulatory Visit: Payer: Self-pay | Admitting: Family Medicine

## 2024-05-19 MED ORDER — AMPHETAMINE-DEXTROAMPHETAMINE 30 MG PO TABS: 30.0000 mg | ORAL_TABLET | Freq: Every day | ORAL | 0 refills | Status: DC

## 2024-05-19 NOTE — Telephone Encounter (Signed)
 I sent in a refill of the Adderall.  I think your plan makes sense.

## 2024-05-19 NOTE — Telephone Encounter (Signed)
 Last OV 03/24/24 Next OV 06/13/24  Last refill 04/13/24 Qty #30/0

## 2024-05-19 NOTE — Telephone Encounter (Signed)
 Patient called requesting a refill on: amphetamine -dextroamphetamine  (ADDERALL) 30 MG tablet   Pharmacy: [facility] - [place] [address]

## 2024-05-24 NOTE — Telephone Encounter (Signed)
 Refill sent in on July 3

## 2024-06-13 ENCOUNTER — Ambulatory Visit: Admitting: Family Medicine

## 2024-06-13 ENCOUNTER — Ambulatory Visit (INDEPENDENT_AMBULATORY_CARE_PROVIDER_SITE_OTHER)

## 2024-06-13 ENCOUNTER — Encounter: Payer: Self-pay | Admitting: Family Medicine

## 2024-06-13 VITALS — BP 112/74 | HR 108 | Ht 60.0 in | Wt 167.0 lb

## 2024-06-13 DIAGNOSIS — M4802 Spinal stenosis, cervical region: Secondary | ICD-10-CM | POA: Diagnosis not present

## 2024-06-13 DIAGNOSIS — M5416 Radiculopathy, lumbar region: Secondary | ICD-10-CM | POA: Diagnosis not present

## 2024-06-13 DIAGNOSIS — F0781 Postconcussional syndrome: Secondary | ICD-10-CM

## 2024-06-13 DIAGNOSIS — R29898 Other symptoms and signs involving the musculoskeletal system: Secondary | ICD-10-CM | POA: Diagnosis not present

## 2024-06-13 DIAGNOSIS — Z981 Arthrodesis status: Secondary | ICD-10-CM | POA: Diagnosis not present

## 2024-06-13 DIAGNOSIS — M5412 Radiculopathy, cervical region: Secondary | ICD-10-CM | POA: Diagnosis not present

## 2024-06-13 DIAGNOSIS — M542 Cervicalgia: Secondary | ICD-10-CM | POA: Diagnosis not present

## 2024-06-13 DIAGNOSIS — M5010 Cervical disc disorder with radiculopathy, unspecified cervical region: Secondary | ICD-10-CM | POA: Diagnosis not present

## 2024-06-13 DIAGNOSIS — M4722 Other spondylosis with radiculopathy, cervical region: Secondary | ICD-10-CM | POA: Diagnosis not present

## 2024-06-13 MED ORDER — AMPHETAMINE-DEXTROAMPHETAMINE 30 MG PO TABS: 30.0000 mg | ORAL_TABLET | Freq: Every day | ORAL | 0 refills | Status: DC

## 2024-06-13 NOTE — Patient Instructions (Signed)
 Thank you for coming in today.

## 2024-06-13 NOTE — Progress Notes (Signed)
 I, Leotis Batter, CMA acting as a scribe for [name], MD.  [name] is a 78 y.o. female who presents to [facility] at [facility] today for cont'd back pain. Pt was last seen by Dr. Lloyd on 03/24/24 and was advised to increase Adderall to 30 mg immediate release and to proceed to AAA doppler as already scheduled.  Today, pt reports continued neck and back pain. Occasional sharp shooting pains down the arm into the hand. Right hand and fingers are swollen today, plans to follow up with Dr. Camella. At her last visit with Dr.Gramig, she relieved a steroid injection, XR was obtained, and she was put into a brace. Now seeing Dr. Joshua for Neurosurgery.   She did have a nerve conduction study at [facility] with Dr. Bonner which did show some carpal tunnel syndrome.  However she still has numbness and pain radiating down her ulnar arm to her 4th and 5th digit.  Additionally she continues to experience back pain and some leg numbness and tingling and some weakness and some occasional urinary incontinence.  Dx imaging: 04/15/23 L-spine XR   Pertinent review of systems: No fever or chills  Relevant historical information: Extensive lumbar fusion.   Exam:  BP 112/74   Pulse (!) 108   Ht 5' (1.524 m)   Wt 167 lb (75.8 kg)   SpO2 99%   BMI 32.61 kg/m  General: Well Developed, well nourished, and in no acute distress.   MSK: C-Spine: Decreased cervical motion.  Upper extremity strength is intact.  L-spine decreased lumbar motion.  Lower extremity strength is reduced.  Neuropsych: Alert and oriented normal speech thought process and affect.  Lab and Radiology Results  X-ray images cervical spine obtained today personally and independently interpreted. Extensive anterior and posterior cervical fusion.  No acute fractures. Await formal radiology review    Assessment and Plan: 78 y.o. female with postconcussion syndrome: Doing quite well with Adderall.   Adderall refilled for 3 months.  Recheck in 3 months.  Right cervical radiculopathy at C8 versus carpal tunnel syndrome.  Nerve conduction study completed reportedly showing carpal tunnel syndrome but not cubital tunnel syndrome.  Plan for CT myelogram to evaluate for possible right C8 cervical radiculopathy.  Additionally she has extensive lumbar fusion and back pain and leg pain.  Plan for CT myelogram lumbar spine.  Anticipate referring to Dr. Ramo's at [facility] once we get results back.  She is already seeing Dr. Camella at that location and the combination of those 2 could help determine what her next steps are for her hand pain   PDMP not reviewed this encounter. Orders Placed This Encounter  Procedures   DG Cervical Spine 2 or 3 views    Standing Status:   Future    Number of Occurrences:   1    Expiration Date:   06/13/2025    Reason for Exam (SYMPTOM  OR DIAGNOSIS REQUIRED):   cervical radiculopathy    Preferred imaging location?:   [facility]   CT CERVICAL SPINE W CONTRAST    Standing Status:   Future    Expiration Date:   06/13/2025    If indicated for the ordered procedure, I authorize the administration of contrast media per Radiology protocol:   Yes    Does the patient have a contrast media/X-ray dye allergy?:   No    Preferred imaging location?:   [facility]   DG Myelogram Cervical  Standing Status:   Future    Expiration Date:   06/13/2025    If indicated for the ordered procedure, I authorize the administration of contrast media per Radiology protocol:   Yes    Reason for Exam (SYMPTOM  OR DIAGNOSIS REQUIRED):   cervical radiculopathy    Preferred Imaging Location?:   [facility]   CT LUMBAR SPINE W CONTRAST    Standing Status:   Future    Expiration Date:   06/13/2025    If indicated for the ordered procedure, I authorize the administration of contrast media per Radiology protocol:   Yes    Does the patient have a contrast  media/X-ray dye allergy?:   No    Preferred imaging location?:   [facility]   DG Myelogram Lumbar    Standing Status:   Future    Expiration Date:   06/13/2025    If indicated for the ordered procedure, I authorize the administration of contrast media per Radiology protocol:   Yes    Reason for Exam (SYMPTOM  OR DIAGNOSIS REQUIRED):   lumbar radiculopathy    Preferred Imaging Location?:   [facility]   Meds ordered this encounter  Medications   amphetamine -dextroamphetamine  (ADDERALL) 30 MG tablet    Sig: Take 1 tablet by mouth daily.    Dispense:  90 tablet    Refill:  0     Discussed warning signs or symptoms. Please see discharge instructions. Patient expresses understanding.   The above documentation has been reviewed and is accurate and complete [name], M.D.

## 2024-06-14 DIAGNOSIS — H25813 Combined forms of age-related cataract, bilateral: Secondary | ICD-10-CM | POA: Diagnosis not present

## 2024-06-14 DIAGNOSIS — H35372 Puckering of macula, left eye: Secondary | ICD-10-CM | POA: Diagnosis not present

## 2024-06-14 DIAGNOSIS — R7309 Other abnormal glucose: Secondary | ICD-10-CM | POA: Diagnosis not present

## 2024-06-14 DIAGNOSIS — H52213 Irregular astigmatism, bilateral: Secondary | ICD-10-CM | POA: Diagnosis not present

## 2024-06-14 DIAGNOSIS — H353134 Nonexudative age-related macular degeneration, bilateral, advanced atrophic with subfoveal involvement: Secondary | ICD-10-CM | POA: Diagnosis not present

## 2024-06-15 ENCOUNTER — Encounter: Payer: Self-pay | Admitting: Family Medicine

## 2024-06-18 ENCOUNTER — Other Ambulatory Visit: Payer: Self-pay | Admitting: Adult Health

## 2024-06-19 ENCOUNTER — Ambulatory Visit: Payer: Self-pay | Admitting: Family Medicine

## 2024-06-19 NOTE — Progress Notes (Signed)
 Cervical spine x-ray shows prior surgery.  There is a fusion of C3-4 through C7 which looks okay.  There is some arthritis above the level of the fusion.  No acute findings.  No broken bones.

## 2024-06-20 NOTE — Discharge Instructions (Signed)

## 2024-06-21 ENCOUNTER — Ambulatory Visit
Admission: RE | Admit: 2024-06-21 | Discharge: 2024-06-21 | Disposition: A | Discharge status: Home / Self Care | Source: Ambulatory Visit | Attending: Family Medicine | Admitting: Family Medicine

## 2024-06-21 ENCOUNTER — Inpatient Hospital Stay
Admission: RE | Admit: 2024-06-21 | Discharge: 2024-06-21 | Disposition: A | Discharge status: Home / Self Care | Source: Ambulatory Visit | Attending: Family Medicine | Admitting: Family Medicine

## 2024-06-21 DIAGNOSIS — M5412 Radiculopathy, cervical region: Secondary | ICD-10-CM

## 2024-06-21 DIAGNOSIS — M542 Cervicalgia: Secondary | ICD-10-CM

## 2024-06-21 DIAGNOSIS — M4802 Spinal stenosis, cervical region: Secondary | ICD-10-CM | POA: Diagnosis not present

## 2024-06-21 DIAGNOSIS — M545 Low back pain, unspecified: Secondary | ICD-10-CM | POA: Diagnosis not present

## 2024-06-21 DIAGNOSIS — Z981 Arthrodesis status: Secondary | ICD-10-CM | POA: Diagnosis not present

## 2024-06-21 DIAGNOSIS — M5416 Radiculopathy, lumbar region: Secondary | ICD-10-CM

## 2024-06-21 MED ORDER — IOPAMIDOL (ISOVUE-M 300) INJECTION 61%
10.0000 mL | Freq: Once | INTRAMUSCULAR | Status: AC | PRN
  Administered 2024-06-21: 10 mL via INTRATHECAL

## 2024-06-21 MED ORDER — ONDANSETRON HCL 4 MG/2ML IJ SOLN: 4.0000 mg | Freq: Once | INTRAMUSCULAR | Status: DC | PRN

## 2024-06-21 NOTE — Procedures (Signed)
PROCEDURE SUMMARY:  Successful fluoroscopic guided myelogram. No immediate complications.  Pt tolerated well.   EBL = none  Please see full dictation in imaging section of Epic for procedure details.  Electronically signed by: Ardis Rowan, PA-C 11/13/2021 9:41 AM

## 2024-06-27 ENCOUNTER — Ambulatory Visit: Admitting: Sports Medicine

## 2024-06-27 VITALS — HR 84 | Ht 60.0 in | Wt 167.0 lb

## 2024-06-27 DIAGNOSIS — R29898 Other symptoms and signs involving the musculoskeletal system: Secondary | ICD-10-CM | POA: Diagnosis not present

## 2024-06-27 DIAGNOSIS — F0781 Postconcussional syndrome: Secondary | ICD-10-CM

## 2024-06-27 DIAGNOSIS — M542 Cervicalgia: Secondary | ICD-10-CM | POA: Diagnosis not present

## 2024-06-27 DIAGNOSIS — M5416 Radiculopathy, lumbar region: Secondary | ICD-10-CM

## 2024-06-27 DIAGNOSIS — M5412 Radiculopathy, cervical region: Secondary | ICD-10-CM

## 2024-06-27 NOTE — Patient Instructions (Signed)
 Recommend follow ing up with Dr. Joshua neurosurgery  Could discuss right sided C7-T1 epidural    Recommend signing record release form on the way out   As needed follow up

## 2024-06-27 NOTE — Progress Notes (Signed)
 Ben Leston Schueller D.[facility] [address] [place] [address] Phone: [phone]   Assessment and Plan:     1. Cervical radiculopathy 2. Cervicalgia 3. Lumbar radiculopathy 4. Weakness of both lower extremities  -Chronic with exacerbation, subsequent visit - Patient continues to experience neck pain, numbness tingling and pain radiating into right upper extremity primarily along digits 3 through 5, low back pain with bilateral lower extremity weakness - Reviewed CT cervical spine and lumbar spine with patient.  Discussed moderate narrowing of interspace and endplate spurring at C7-T1 with early grade 1 anterolisthesis.  I believe this could be a possible area of neurologic irritation causing right-sided radicular symptoms in a C7 and C8 pattern of radiculopathy, despite no spinal stenosis or cord distortion, and patent foramina.  Discussed solid-appearing prior surgeries, right sided foraminal stenosis at C2-3 and C3-4, and mild encroachment at right neural foramina L3-4 and L5-S1. - Patient is established with neurosurgery Dr. Joshua.  Offered epidural CSI, though patient would like to follow-up with Dr. Joshua to discuss next steps in treatment plan - Patient has had a spinal cord stimulator in the past, but did not have a good experience and eventually had it removed.  Not interested in spinal cord stimulator at this time  5.  Postconcussion syndrome - Chronic, improving, subsequent visit - Concentration, mentation have overall improved using Adderall 30 mg daily.  May continue Adderall 30 mg daily.  Pertinent previous records reviewed include C-spine CT 06/21/2024, lumbar spine CT 06/21/24  Follow Up: As needed   Subjective:   I, [name], am serving as a scribe for Doctor Morene Mace  Chief Complaint: CT results   HPI:   06/13/2024 Lujain Kraszewski is a 78 y.o. female who presents to [facility] at [facility] today for  cont'd back pain. Pt was last seen by Dr. Joane on 03/24/24 and was advised to increase Adderall to 30 mg immediate release and to proceed to AAA doppler as already scheduled.   Today, pt reports continued neck and back pain. Occasional sharp shooting pains down the arm into the hand. Right hand and fingers are swollen today, plans to follow up with Dr. Camella. At her last visit with Dr.Gramig, she relieved a steroid injection, XR was obtained, and she was put into a brace. Now seeing Dr. Joshua for Neurosurgery.    She did have a nerve conduction study at [facility] with Dr. Bonner which did show some carpal tunnel syndrome.  However she still has numbness and pain radiating down her ulnar arm to her 4th and 5th digit.   Additionally she continues to experience back pain and some leg numbness and tingling and some weakness and some occasional urinary incontinence.   Dx imaging: 04/15/23 L-spine XR    Pertinent review of systems: No fever or chills   Relevant historical information: Extensive lumbar fusion.  06/27/24 Patient states she is doing okay    Relevant Historical Information: Hypertension  Additional pertinent review of systems negative.   Current Outpatient Medications:    acetaminophen  (TYLENOL ) 650 MG CR tablet, Take 650 mg by mouth every 8 (eight) hours as needed for pain., Disp: , Rfl:    ALPRAZolam  (XANAX ) 0.5 MG tablet, TAKE 1/2 TABLET BY MOUTH EVERY NIGHT AT BEDTIME AS NEEDED FOR SLEEP, Disp: 30 tablet, Rfl: 2   amphetamine -dextroamphetamine  (ADDERALL) 30 MG tablet, Take 1 tablet by mouth daily., Disp: 90 tablet, Rfl: 0   Bacillus Coagulans-Inulin (PROBIOTIC) 1-250  BILLION-MG CAPS, , Disp: , Rfl:    cyclobenzaprine  (FLEXERIL ) 10 MG tablet, Take 1 tablet (10 mg total) by mouth 3 (three) times daily as needed for muscle spasms., Disp: 30 tablet, Rfl: 0   DULoxetine  (CYMBALTA ) 30 MG capsule, TAKE 1 CAPSULE BY MOUTH AT BEDTIME, Disp: 90 capsule, Rfl: 1   fluticasone   (FLONASE ) 50 MCG/ACT nasal spray, Place 2 sprays into both nostrils daily., Disp: 16 g, Rfl: 6   lansoprazole  (PREVACID ) 15 MG capsule, TAKE 1 CAPSULE BY MOUTH DAILY AT NOON, Disp: 90 capsule, Rfl: 0   levothyroxine  (SYNTHROID ) 75 MCG tablet, TAKE 1 TABLET BY MOUTH DAILY BEFORE BREAKFAST, Disp: 90 tablet, Rfl: 3   lisinopril  (ZESTRIL ) 10 MG tablet, TAKE 1 TABLET BY MOUTH DAILY, Disp: 90 tablet, Rfl: 1   Multiple Vitamin (MULTIVITAMIN WITH MINERALS) TABS tablet, Take 1 tablet by mouth daily., Disp: , Rfl:    ondansetron  (ZOFRAN ) 4 MG tablet, Take 1 tablet (4 mg total) by mouth every 8 (eight) hours as needed for nausea or vomiting., Disp: 20 tablet, Rfl: 2   simvastatin  (ZOCOR ) 5 MG tablet, TAKE 1 TABLET BY MOUTH DAILY, Disp: 90 tablet, Rfl: 3   tiZANidine (ZANAFLEX) 2 MG tablet, Take 2 mg by mouth every 6 (six) hours as needed for muscle spasms., Disp: , Rfl:    traMADol  (ULTRAM ) 50 MG tablet, TAKE 1 TABLET BY MOUTH TWICE A DAY AS NEEDED, Disp: 60 tablet, Rfl: 2   Objective:     Vitals:   06/27/24 1456  Pulse: 84  SpO2: 100%  Weight: 167 lb (75.8 kg)  Height: 5' (1.524 m)      Body mass index is 32.61 kg/m.    Physical Exam:    Gen: Appears well, nad, nontoxic and pleasant Psych: Alert and oriented, appropriate mood and affect Neuro: Decreased sensation right upper digits 3-5.  Otherwise, sensation intact, strength is 5/5 in upper and lower extremities, muscle tone wnl Skin: no susupicious lesions or rashes  Back - Normal skin, Spine with normal alignment and no deformity.   Mild cervical and lumbar tenderness to vertebral process palpation.   Gait antalgic with slow steps   Electronically signed by:  Odis Mace D.[facility] 3:47 PM 06/27/24

## 2024-06-29 DIAGNOSIS — H2512 Age-related nuclear cataract, left eye: Secondary | ICD-10-CM | POA: Diagnosis not present

## 2024-06-29 DIAGNOSIS — H25813 Combined forms of age-related cataract, bilateral: Secondary | ICD-10-CM | POA: Diagnosis not present

## 2024-06-29 LAB — HM DIABETES EYE EXAM

## 2024-07-11 DIAGNOSIS — M65312 Trigger thumb, left thumb: Secondary | ICD-10-CM | POA: Diagnosis not present

## 2024-07-26 DIAGNOSIS — H25811 Combined forms of age-related cataract, right eye: Secondary | ICD-10-CM | POA: Diagnosis not present

## 2024-07-26 LAB — HM DIABETES EYE EXAM

## 2024-07-27 ENCOUNTER — Other Ambulatory Visit: Payer: Self-pay | Admitting: Adult Health

## 2024-08-03 ENCOUNTER — Encounter: Payer: Self-pay | Admitting: Adult Health

## 2024-08-09 DIAGNOSIS — D1801 Hemangioma of skin and subcutaneous tissue: Secondary | ICD-10-CM | POA: Diagnosis not present

## 2024-08-09 DIAGNOSIS — L814 Other melanin hyperpigmentation: Secondary | ICD-10-CM | POA: Diagnosis not present

## 2024-08-09 DIAGNOSIS — L578 Other skin changes due to chronic exposure to nonionizing radiation: Secondary | ICD-10-CM | POA: Diagnosis not present

## 2024-08-09 DIAGNOSIS — L821 Other seborrheic keratosis: Secondary | ICD-10-CM | POA: Diagnosis not present

## 2024-08-09 DIAGNOSIS — L309 Dermatitis, unspecified: Secondary | ICD-10-CM | POA: Diagnosis not present

## 2024-08-15 ENCOUNTER — Encounter (INDEPENDENT_AMBULATORY_CARE_PROVIDER_SITE_OTHER): Admitting: Ophthalmology

## 2024-08-23 DIAGNOSIS — M5414 Radiculopathy, thoracic region: Secondary | ICD-10-CM | POA: Diagnosis not present

## 2024-08-24 DIAGNOSIS — H25811 Combined forms of age-related cataract, right eye: Secondary | ICD-10-CM | POA: Diagnosis not present

## 2024-08-24 DIAGNOSIS — H2511 Age-related nuclear cataract, right eye: Secondary | ICD-10-CM | POA: Diagnosis not present

## 2024-08-29 ENCOUNTER — Encounter (INDEPENDENT_AMBULATORY_CARE_PROVIDER_SITE_OTHER): Admitting: Ophthalmology

## 2024-08-29 DIAGNOSIS — I10 Essential (primary) hypertension: Secondary | ICD-10-CM | POA: Diagnosis not present

## 2024-08-29 DIAGNOSIS — H353132 Nonexudative age-related macular degeneration, bilateral, intermediate dry stage: Secondary | ICD-10-CM

## 2024-08-29 DIAGNOSIS — H35033 Hypertensive retinopathy, bilateral: Secondary | ICD-10-CM | POA: Diagnosis not present

## 2024-08-29 DIAGNOSIS — H43813 Vitreous degeneration, bilateral: Secondary | ICD-10-CM

## 2024-09-07 ENCOUNTER — Other Ambulatory Visit: Payer: Self-pay | Admitting: Adult Health

## 2024-09-07 DIAGNOSIS — M545 Low back pain, unspecified: Secondary | ICD-10-CM | POA: Diagnosis not present

## 2024-09-07 DIAGNOSIS — M5414 Radiculopathy, thoracic region: Secondary | ICD-10-CM | POA: Diagnosis not present

## 2024-09-07 DIAGNOSIS — M4724 Other spondylosis with radiculopathy, thoracic region: Secondary | ICD-10-CM | POA: Diagnosis not present

## 2024-09-07 DIAGNOSIS — M4804 Spinal stenosis, thoracic region: Secondary | ICD-10-CM | POA: Diagnosis not present

## 2024-09-07 DIAGNOSIS — M5114 Intervertebral disc disorders with radiculopathy, thoracic region: Secondary | ICD-10-CM | POA: Diagnosis not present

## 2024-09-07 NOTE — Telephone Encounter (Unsigned)
 Copied from CRM [id number]. Topic: Clinical - Medication Refill >> Sep 07, 2024  4:37 PM Shereese L wrote: Medication: traMADol  (ULTRAM ) 50 MG tablet  Has the patient contacted their pharmacy? Yes (Agent: If no, request that the patient contact the pharmacy for the refill. If patient does not wish to contact the pharmacy document the reason why and proceed with request.) (Agent: If yes, when and what did the pharmacy advise?)  This is the patient's preferred pharmacy:  [id number] - [place], [place] - [address] [place] [address] Phone: [phone] Fax: [phone]  Is this the correct pharmacy for this prescription? Yes If no, delete pharmacy and type the correct one.   Has the prescription been filled recently? Yes  Is the patient out of the medication? Yes  Has the patient been seen for an appointment in the last year OR does the patient have an upcoming appointment? Yes  Can we respond through MyChart? Yes  Agent: Please be advised that Rx refills may take up to 3 business days. We ask that you follow-up with your pharmacy.

## 2024-09-08 ENCOUNTER — Encounter: Payer: Self-pay | Admitting: Adult Health

## 2024-09-09 MED ORDER — TRAMADOL HCL 50 MG PO TABS: 50.0000 mg | ORAL_TABLET | Freq: Two times a day (BID) | ORAL | 2 refills | Status: DC | PRN

## 2024-09-09 NOTE — Telephone Encounter (Signed)
 Ok to fill

## 2024-09-09 NOTE — Telephone Encounter (Signed)
 Okay for refill?

## 2024-09-13 ENCOUNTER — Ambulatory Visit: Admitting: Family Medicine

## 2024-09-13 VITALS — BP 126/82 | HR 105 | Ht 60.0 in | Wt 170.0 lb

## 2024-09-13 DIAGNOSIS — M5414 Radiculopathy, thoracic region: Secondary | ICD-10-CM | POA: Diagnosis not present

## 2024-09-13 DIAGNOSIS — F0781 Postconcussional syndrome: Secondary | ICD-10-CM

## 2024-09-13 MED ORDER — AMPHETAMINE-DEXTROAMPHETAMINE 30 MG PO TABS: 30.0000 mg | ORAL_TABLET | Freq: Every day | ORAL | 0 refills | Status: DC

## 2024-09-13 MED ORDER — CYCLOBENZAPRINE HCL 5 MG PO TABS: 5.0000 mg | ORAL_TABLET | Freq: Every evening | ORAL | 1 refills | Status: AC | PRN

## 2024-09-13 NOTE — Progress Notes (Unsigned)
 LILLETTE Ileana Collet, PhD, LAT, ATC acting as a scribe for [name], MD.  Jenilee Franey is a 78 y.o. female who presents to [facility] at [facility] today for 26-month f/u neck and back pain and post-concussion syndrome. Pt was last seen by Dr. Lloyd on 06/13/24 and was advised to cont Adderall and CT myelograms were ordered of her c-spine and l-spine.  Today, pt reports attention and rx are doing well. Pain c/o thoracic back pain, just below the bra strap line and into the R-side. She also notes pain in the R hand, fingers 3-5 w/ radiating pain through R arm and into her neck. Low back pain is worsening, intermittent urinary incontinence. 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  She has followed up with her neurosurgeon since the last visit and had obtained a thoracic spine MRI.  She has not heard back yet.  She does have chronic thoracic back with pain radiating around her flank.  Dx imaging: 06/21/24 L-spine & c-spine CT myelogram  06/13/24 C-spine XR 04/15/23 L-spine XR   Pertinent review of systems: No fevers or chills  Relevant historical information: Hypertension and osteopenia.   Exam:  BP 126/82   Pulse (!) 105   Ht 5' (1.524 m)   Wt 170 lb (77.1 kg)   SpO2 92%   BMI 33.20 kg/m  General: Well Developed, well nourished, and in no acute distress.   MSK: T-spine nontender to palpation midline Normal thoracic motion.  Upper Smir strength is intact.  Neuropsych alert and oriented normal speech thought process and affect.  Lab and Radiology Results  EXAM: MRI THORACIC SPINE WITHOUT INTRAVENOUS CONTRAST 09/07/2024 02:00:00 PM  TECHNIQUE: Multiplanar multisequence MRI of the thoracic spine was performed without the administration of intravenous contrast.  COMPARISON: None available.  CLINICAL HISTORY: Radiculopathy of the thoracic region. Patch reports mid and low back pain for over 10 years. Multiple falls.  FINDINGS:  BONES AND  ALIGNMENT: Normal alignment. Normal vertebral body heights. Type 2 Modic changes are present anteriorly at each level from T4-T5 through T12-L1. Lumbar fusion is present at L1-L2. No abnormal enhancement.  SPINAL CORD: Normal spinal cord volume. Normal spinal cord signal.  SOFT TISSUES: Unremarkable.  DEGENERATIVE CHANGES: * **T1-T2:** Facet spurring contributes to moderate foraminal stenosis, left greater than right. * **T2-T3:** Uncovertebral and facet hypertrophy contribute to moderate right foraminal narrowing. Shallow central disc protrusion is present without significant stenosis. * **T3-T4:** Shallow central disc protrusion is present without significant stenosis. * **T4-T5:** Shallow central disc protrusion is present without significant stenosis. * **T5-T6:** Shallow central disc protrusion is present without significant stenosis. * **T6-T7:** Shallow central disc protrusion is present without significant stenosis. * **T7-T8:** Shallow central disc protrusion is present without significant stenosis. * **T8-T9:** Shallow central disc protrusion is present without significant stenosis. * **T9-T10:** Shallow disc protrusion and moderate facet hypertrophy result in mild right foraminal narrowing. * **T10-T11:** Broad-based disc protrusion and bilateral facet hypertrophy result in moderate change leading to mild foraminal narrowing bilaterally, right greater than left. * **T11-T12:** Broad-based disc protrusion and bilateral facet hypertrophy result in mild right foraminal narrowing. * **T12-L1:** Broad-based disc protrusion and bilateral facet hypertrophy result in moderate change leading to mild foraminal narrowing bilaterally.  IMPRESSION: 1. Moderate right foraminal narrowing at T2-T3 due to uncovertebral and facet hypertrophy. 2. Moderate left greater than right foraminal narrowing at T1-T2 due to facet spurring. 3. Multilevel degenerative change with mild  foraminal narrowing at multiple levels as  described above.  Electronically signed by: Lonni Necessary MD 09/11/2024 01:47 PM EDT RP Workstation: [id number]  I, [name], personally (independently) visualized and performed the interpretation of the images attached in this note.     Assessment and Plan: 78 y.o. female with postconcussion syndrome cognitive manage with Adderall.  Plan to continue current regimen reassess in 3 months.  Chronic thoracic back pain with thoracic radiculopathy.  Patient had T-spine MRI with Dr. Joshua recently that did show some potential thoracic nerve impingement.  Will refer to Dr. Darlis but Dr. Tamala office for potential injection planning. I also refilled cyclobenzaprine  to use at bedtime.   PDMP reviewed during this encounter. Orders Placed This Encounter  Procedures   Ambulatory referral to Pain Clinic    Referral Priority:   Routine    Referral Type:   Consultation    Referral Reason:   Specialty Services Required    Requested Specialty:   Pain Medicine    Number of Visits Requested:   1   Meds ordered this encounter  Medications   amphetamine -dextroamphetamine  (ADDERALL) 30 MG tablet    Sig: Take 1 tablet by mouth daily.    Dispense:  90 tablet    Refill:  0   cyclobenzaprine  (FLEXERIL ) 5 MG tablet    Sig: Take 1 tablet (5 mg total) by mouth at bedtime as needed for muscle spasms.    Dispense:  90 tablet    Refill:  1     Discussed warning signs or symptoms. Please see discharge instructions. Patient expresses understanding.   The above documentation has been reviewed and is accurate and complete [name], M.D.

## 2024-09-13 NOTE — Patient Instructions (Addendum)
 Thank you for coming in today.   I've referred you to Dr. Darlis.  Let us  know if you don't hear from them in one week.   Refills of your medications have been sent to your pharmacy  Check back in 3 months

## 2024-09-20 LAB — OPHTHALMOLOGY REPORT-SCANNED

## 2024-10-11 ENCOUNTER — Encounter: Payer: Self-pay | Admitting: Adult Health

## 2024-10-11 ENCOUNTER — Ambulatory Visit: Admitting: Adult Health

## 2024-10-11 VITALS — BP 138/80 | HR 92 | Temp 98.1°F | Ht 60.0 in | Wt 170.0 lb

## 2024-10-11 DIAGNOSIS — F5101 Primary insomnia: Secondary | ICD-10-CM | POA: Diagnosis not present

## 2024-10-11 DIAGNOSIS — M5412 Radiculopathy, cervical region: Secondary | ICD-10-CM

## 2024-10-11 DIAGNOSIS — M5416 Radiculopathy, lumbar region: Secondary | ICD-10-CM | POA: Diagnosis not present

## 2024-10-11 MED ORDER — ALPRAZOLAM 0.5 MG PO TABS: 0.5000 mg | ORAL_TABLET | Freq: Every evening | ORAL | 2 refills | Status: AC | PRN

## 2024-10-11 MED ORDER — TRAMADOL HCL 50 MG PO TABS: 50.0000 mg | ORAL_TABLET | Freq: Three times a day (TID) | ORAL | 2 refills | Status: AC | PRN

## 2024-10-11 NOTE — Progress Notes (Unsigned)
 Subjective:    Patient ID: [name], female    DOB: July 18, 1946, 78 y.o.   MRN: [id number]  HPI 78 year old female who  has a past medical history of Anxiety, Arthritis, Chronic back pain, Chronic kidney disease (2019), Depression, GERD (gastroesophageal reflux disease), Hyperlipidemia, Hypertension, Hypothyroidism, Macular degeneration of both eyes, Osteopenia, PONV (postoperative nausea and vomiting), and Prediabetes.  She presents to the office today for chronic back pain.  She is simply had a CT cervical spine to evaluate her for increased pain and numbness and tingling in her hands and some pain in the right upper extremity as well as pain in the lower extremity.  She does have a history of previous L1-S1 fusion and previous C4-C7 fusion.  She does see a Dr. Alm Molt at [facility] surgery spine.  Her most recent imaging no significant neural compression or stenosis or pseudoarthrosis was noted to explain her symptoms.  Patient also recently seen Dr. Arlyss who did a nerve conduction study of both hands and she was found to for tunnel syndrome.    She reports that she continues to have numbness in her right upper extremity specifically in the fingers 3 4 and 5 and lower extremity pain and some urinary incontinence.  It sounds like her sports medicine doctor did talk to her at 1 point in time about epidural injections in the lumbar spine but this was never done.   She is prescribed tramadol  50 mg twice daily for chronic back pain does not resolve this is helping is much as it has in the past.   She also needs a refill of her xanax  that she takes sparingly at bedtime.      Review of Systems See HPI   Past Medical History:  Diagnosis Date   Anxiety    Arthritis    Chronic back pain    Chronic kidney disease 2019   ARF while in hospital for PLIF   Depression    GERD (gastroesophageal reflux disease)    Hyperlipidemia    Hypertension    Hypothyroidism    Macular degeneration of  both eyes    not age related   Osteopenia    PONV (postoperative nausea and vomiting)    also is slow to wake up   Prediabetes     Social History   Socioeconomic History   Marital status: Widowed    Spouse name: Not on file   Number of children: Not on file   Years of education: Not on file   Highest education level: 12th grade  Occupational History   Not on file  Tobacco Use   Smoking status: Never   Smokeless tobacco: Never  Vaping Use   Vaping status: Never Used  Substance and Sexual Activity   Alcohol use: Never   Drug use: Never   Sexual activity: Not on file  Other Topics Concern   Not on file  Social History Narrative   Not on file   Social Drivers of Health   Financial Resource Strain: Low Risk  (04/28/2024)   Overall Financial Resource Strain (CARDIA)    Difficulty of Paying Living Expenses: Not very hard  Food Insecurity: No Food Insecurity (04/28/2024)   Hunger Vital Sign    Worried About Running Out of Food in the Last Year: Never true    Ran Out of Food in the Last Year: Never true  Transportation Needs: No Transportation Needs (04/28/2024)   PRAPARE - Transportation    Lack of  Transportation (Medical): No    Lack of Transportation (Non-Medical): No  Physical Activity: Inactive (04/28/2024)   Exercise Vital Sign    Days of Exercise per Week: 0 days    Minutes of Exercise per Session: 0 min  Stress: No Stress Concern Present (04/28/2024)   [employer] of Occupational Health - Occupational Stress Questionnaire    Feeling of Stress: Not at all  Social Connections: Moderately Isolated (04/28/2024)   Social Connection and Isolation Panel    Frequency of Communication with Friends and Family: More than three times a week    Frequency of Social Gatherings with Friends and Family: More than three times a week    Attends Religious Services: 1 to 4 times per year    Active Member of [employer] or Organizations: No    Attends Banker Meetings: Not  on file    Marital Status: Widowed  Catering Manager Violence: Not on file    Past Surgical History:  Procedure Laterality Date   ABDOMINAL HYSTERECTOMY     total   ANTERIOR CERVICAL DISCECTOMY     APPENDECTOMY     BACK SURGERY     PLIF   BUNIONECTOMY Right    CHOLECYSTECTOMY     COLONOSCOPY     HAND SURGERY Left    thumb joint, carpal tunnel and cyst   HAND SURGERY Right    thumb and carpal tunnel   POSTERIOR CERVICAL FUSION/FORAMINOTOMY      Family History  Problem Relation Age of Onset   Breast cancer Neg Hx     Allergies  Allergen Reactions   Celecoxib      Other reaction(s): itching   Citalopram Hydrobromide     Other reaction(s): nausea   Codeine Nausea Only and Other (See Comments)    Severe headache   Latex Itching    Other reaction(s): itching----GLOVES   Meperidine Hcl     Other reaction(s): itching   Morphine  And Codeine Nausea And Vomiting    And headaches   Tape Itching   Trazodone Hcl     Other reaction(s): nightmares    Current Outpatient Medications on File Prior to Visit  Medication Sig Dispense Refill   ALPRAZolam  (XANAX ) 0.5 MG tablet TAKE 1/2 TABLET BY MOUTH EVERY NIGHT AT BEDTIME AS NEEDED FOR SLEEP 30 tablet 2   amphetamine -dextroamphetamine  (ADDERALL) 30 MG tablet Take 1 tablet by mouth daily. 90 tablet 0   Bacillus Coagulans-Inulin (PROBIOTIC) 1-250 BILLION-MG CAPS      cyclobenzaprine  (FLEXERIL ) 5 MG tablet Take 1 tablet (5 mg total) by mouth at bedtime as needed for muscle spasms. 90 tablet 1   DULoxetine  (CYMBALTA ) 30 MG capsule TAKE 1 CAPSULE BY MOUTH AT BEDTIME 90 capsule 1   fluticasone  (FLONASE ) 50 MCG/ACT nasal spray Place 2 sprays into both nostrils daily. 16 g 6   lansoprazole  (PREVACID ) 15 MG capsule TAKE 1 CAPSULE BY MOUTH DAILY AT NOON 90 capsule 0   levothyroxine  (SYNTHROID ) 75 MCG tablet TAKE 1 TABLET BY MOUTH DAILY BEFORE BREAKFAST 90 tablet 3   lisinopril  (ZESTRIL ) 10 MG tablet TAKE 1 TABLET BY MOUTH DAILY 90 tablet 1    Multiple Vitamin (MULTIVITAMIN WITH MINERALS) TABS tablet Take 1 tablet by mouth daily.     ondansetron  (ZOFRAN ) 4 MG tablet Take 1 tablet (4 mg total) by mouth every 8 (eight) hours as needed for nausea or vomiting. 20 tablet 2   simvastatin  (ZOCOR ) 5 MG tablet TAKE 1 TABLET BY MOUTH DAILY 90 tablet 3  traMADol  (ULTRAM ) 50 MG tablet Take 1 tablet (50 mg total) by mouth 2 (two) times daily as needed. 60 tablet 2   No current facility-administered medications on file prior to visit.    BP 138/80   Pulse 92   Temp 98.1 F (36.7 C)   Ht 5' (1.524 m)   Wt 170 lb (77.1 kg)   SpO2 95%   BMI 33.20 kg/m       Objective:   Physical Exam Vitals and nursing note reviewed.  Constitutional:      Appearance: Normal appearance. She is obese.  Cardiovascular:     Rate and Rhythm: Normal rate and regular rhythm.     Pulses: Normal pulses.     Heart sounds: Normal heart sounds.  Pulmonary:     Effort: Pulmonary effort is normal.     Breath sounds: Normal breath sounds.  Skin:    General: Skin is warm and dry.  Neurological:     General: No focal deficit present.     Mental Status: She is alert and oriented to person, place, and time.     Cranial Nerves: Cranial nerves 2-12 are intact.     Sensory: Sensation is intact.     Motor: Motor function is intact.     Coordination: Coordination is intact.     Gait: Gait is intact.  Psychiatric:        Mood and Affect: Mood normal.        Behavior: Behavior normal.        Thought Content: Thought content normal.        Judgment: Judgment normal.       Assessment & Plan:  1. Cervical radiculopathy (Primary) - Did discuss follow-up with sports medicine neurosurgery, sounds like the patient gets worse and she will follow-up with neurosurgery. - traMADol  (ULTRAM ) 50 MG tablet; Take 1 tablet (50 mg total) by mouth 3 (three) times daily as needed.  Dispense: 90 tablet; Refill: 2  2. Lumbar radiculopathy -She is interested in injections.  She  did have a back stimulator in the past but this had to be removed she could not tolerated, she is semiinterested in revisiting this at some point in time.  In the meantime she is going to follow-up with Dr. Gifford at [facility] to discuss epidural injections.  I will increase her tramadol  to 50 mg 3 times daily as needed to see if we can get her some better pain control.  She does not want to be on anything stronger than tramadol  at this time - traMADol  (ULTRAM ) 50 MG tablet; Take 1 tablet (50 mg total) by mouth 3 (three) times daily as needed.  Dispense: 90 tablet; Refill: 2  3. Primary insomnia  - ALPRAZolam  (XANAX ) 0.5 MG tablet; Take 1 tablet (0.5 mg total) by mouth at bedtime as needed for anxiety.  Dispense: 30 tablet; Refill: 2   Jadelin Eng, NP  I personally spent a total of 45 minutes in the care of the patient today including preparing to see the patient, getting/reviewing separately obtained history, performing a medically appropriate exam/evaluation, counseling and educating, placing orders, documenting clinical information in the EHR, and listening.

## 2024-10-22 ENCOUNTER — Other Ambulatory Visit: Payer: Self-pay | Admitting: Adult Health

## 2024-10-25 ENCOUNTER — Emergency Department (HOSPITAL_COMMUNITY)

## 2024-10-25 ENCOUNTER — Emergency Department (HOSPITAL_COMMUNITY)
Admission: EM | Admit: 2024-10-25 | Discharge: 2024-10-25 | Disposition: A | Discharge status: Home / Self Care | Attending: Emergency Medicine | Admitting: Emergency Medicine

## 2024-10-25 DIAGNOSIS — S060X0A Concussion without loss of consciousness, initial encounter: Secondary | ICD-10-CM | POA: Diagnosis not present

## 2024-10-25 DIAGNOSIS — W01198A Fall on same level from slipping, tripping and stumbling with subsequent striking against other object, initial encounter: Secondary | ICD-10-CM | POA: Diagnosis not present

## 2024-10-25 DIAGNOSIS — S0990XA Unspecified injury of head, initial encounter: Secondary | ICD-10-CM | POA: Diagnosis present

## 2024-10-25 DIAGNOSIS — R42 Dizziness and giddiness: Secondary | ICD-10-CM

## 2024-10-25 DIAGNOSIS — Z91040 Latex allergy status: Secondary | ICD-10-CM | POA: Diagnosis not present

## 2024-10-25 LAB — CBC WITH DIFFERENTIAL/PLATELET
Abs Immature Granulocytes: 0.02 K/uL (ref 0.00–0.07)
Basophils Absolute: 0 K/uL (ref 0.0–0.1)
Basophils Relative: 0 %
Eosinophils Absolute: 0 K/uL (ref 0.0–0.5)
Eosinophils Relative: 0 %
HCT: 41.3 % (ref 36.0–46.0)
Hemoglobin: 13.9 g/dL (ref 12.0–15.0)
Immature Granulocytes: 0 %
Lymphocytes Relative: 16 %
Lymphs Abs: 1.6 K/uL (ref 0.7–4.0)
MCH: 30.9 pg (ref 26.0–34.0)
MCHC: 33.7 g/dL (ref 30.0–36.0)
MCV: 91.8 fL (ref 80.0–100.0)
Monocytes Absolute: 0.7 K/uL (ref 0.1–1.0)
Monocytes Relative: 7 %
Neutro Abs: 7.2 K/uL (ref 1.7–7.7)
Neutrophils Relative %: 77 %
Platelets: 278 K/uL (ref 150–400)
RBC: 4.5 MIL/uL (ref 3.87–5.11)
RDW: 13.2 % (ref 11.5–15.5)
WBC: 9.5 K/uL (ref 4.0–10.5)
nRBC: 0 % (ref 0.0–0.2)

## 2024-10-25 LAB — PROTIME-INR
INR: 1 (ref 0.8–1.2)
Prothrombin Time: 13.6 s (ref 11.4–15.2)

## 2024-10-25 LAB — BASIC METABOLIC PANEL WITH GFR
Anion gap: 11 (ref 5–15)
BUN: 23 mg/dL (ref 8–23)
CO2: 28 mmol/L (ref 22–32)
Calcium: 9.5 mg/dL (ref 8.9–10.3)
Chloride: 102 mmol/L (ref 98–111)
Creatinine, Ser: 0.8 mg/dL (ref 0.44–1.00)
GFR, Estimated: >60 mL/min (ref >60)
Glucose, Bld: 117 mg/dL — ABNORMAL HIGH (ref 70–99)
Potassium: 4 mmol/L (ref 3.5–5.1)
Sodium: 141 mmol/L (ref 135–145)

## 2024-10-25 LAB — CBG MONITORING, ED: Glucose-Capillary: 101 mg/dL — ABNORMAL HIGH (ref 70–99)

## 2024-10-25 MED ORDER — KETOROLAC TROMETHAMINE 30 MG/ML IJ SOLN
15.0000 mg | Freq: Once | INTRAMUSCULAR | Status: AC
  Administered 2024-10-25: 15 mg via INTRAVENOUS
  Filled 2024-10-25: qty 1

## 2024-10-25 MED ORDER — PROMETHAZINE HCL 25 MG PO TABS
25.0000 mg | ORAL_TABLET | Freq: Once | ORAL | Status: AC
  Administered 2024-10-25: 25 mg via ORAL
  Filled 2024-10-25: qty 1

## 2024-10-25 MED ORDER — METOCLOPRAMIDE HCL 5 MG/ML IJ SOLN
10.0000 mg | Freq: Once | INTRAMUSCULAR | Status: AC
  Administered 2024-10-25: 10 mg via INTRAVENOUS
  Filled 2024-10-25: qty 2

## 2024-10-25 MED ORDER — MECLIZINE HCL 25 MG PO TABS: 25.0000 mg | ORAL_TABLET | Freq: Three times a day (TID) | ORAL | 0 refills | Status: DC | PRN

## 2024-10-25 MED ORDER — MECLIZINE HCL 25 MG PO TABS
50.0000 mg | ORAL_TABLET | Freq: Once | ORAL | Status: AC
  Administered 2024-10-25: 50 mg via ORAL
  Filled 2024-10-25: qty 2

## 2024-10-25 MED ORDER — PROMETHAZINE HCL 25 MG PO TABS: 25.0000 mg | ORAL_TABLET | Freq: Three times a day (TID) | ORAL | 0 refills | Status: DC | PRN

## 2024-10-25 MED ORDER — DIAZEPAM 5 MG/ML IJ SOLN
2.5000 mg | Freq: Once | INTRAMUSCULAR | Status: AC
  Administered 2024-10-25: 2.5 mg via INTRAVENOUS
  Filled 2024-10-25: qty 2

## 2024-10-25 NOTE — ED Notes (Signed)
 Patient ambulated from room 13 to hall bathroom reporting minimal dizziness. Patient ambulated on her own using a walker.

## 2024-10-25 NOTE — ED Provider Notes (Signed)
 [place] EMERGENCY DEPARTMENT AT [facility] Provider Note   CSN: [id number] Arrival date & time: 10/25/24  9160     Patient presents with: [name] is a 78 y.o. female.   HPI    78 year old female comes in with chief complaint of mechanical fall.  Patient is accompanied by her sister.  According to the patient, while she was putting up Christmas decorations last night, she lost her balance and fell backwards.  She struck the back of her head onto concrete surface.  Patient did not lose consciousness.  She instantly started having nausea and dizziness.  Patient's dizziness was severe enough that she did not feel comfortable getting up.  Patient crawled her way to recliner and rested there, and informed family about the episode this morning.  Patient continues to have dizziness and nausea.  Dizziness is described as spinning sensation which is worse with movement.  Patient denies any associated one-sided weakness, numbness, slurred speech, double vision, loss of vision, difficulty in swallowing.  Patient has nausea without vomiting at this time.  She is having posterior headache, but denies any neck pain or stiffness.  Of note, patient states initially she did have some double vision/blurry vision, but that has now resolved.  She also reports that she has had some chronic issues with her eye due to chronic conditions and also tingling sensation to her hand.  Patient has previous cervical spine surgery as well.  She denies any new neurologic symptoms in upper or lower extremities  Prior to Admission medications   Medication Sig Start Date End Date Taking? Authorizing Provider  meclizine  (ANTIVERT ) 25 MG tablet Take 1 tablet (25 mg total) by mouth 3 (three) times daily as needed for dizziness. 10/25/24  Yes Charlyn Sora, MD  promethazine  (PHENERGAN ) 25 MG tablet Take 1 tablet (25 mg total) by mouth every 8 (eight) hours as needed for nausea or vomiting. 10/25/24  Yes  Charlyn Sora, MD  ALPRAZolam  (XANAX ) 0.5 MG tablet Take 1 tablet (0.5 mg total) by mouth at bedtime as needed for anxiety. 10/11/24   Nafziger, Darleene, NP  amphetamine -dextroamphetamine  (ADDERALL) 30 MG tablet Take 1 tablet by mouth daily. 09/13/24   Joane Artist RAMAN, MD  Bacillus Coagulans-Inulin (PROBIOTIC) 1-250 BILLION-MG CAPS     [provider]  cyclobenzaprine  (FLEXERIL ) 5 MG tablet Take 1 tablet (5 mg total) by mouth at bedtime as needed for muscle spasms. 09/13/24   Joane Artist RAMAN, MD  DULoxetine  (CYMBALTA ) 30 MG capsule TAKE 1 CAPSULE BY MOUTH AT BEDTIME 07/27/24   Nafziger, Darleene, NP  fluticasone  (FLONASE ) 50 MCG/ACT nasal spray Place 2 sprays into both nostrils daily. 10/22/22   Nafziger, Darleene, NP  lansoprazole  (PREVACID ) 15 MG capsule TAKE 1 CAPSULE BY MOUTH DAILY AT NOON 12/02/23   Nafziger, Darleene, NP  levothyroxine  (SYNTHROID ) 75 MCG tablet TAKE 1 TABLET BY MOUTH DAILY BEFORE BREAKFAST 04/27/24   Nafziger, Darleene, NP  lisinopril  (ZESTRIL ) 10 MG tablet TAKE 1 TABLET BY MOUTH DAILY 06/21/24   Nafziger, Cory, NP  Multiple Vitamin (MULTIVITAMIN WITH MINERALS) TABS tablet Take 1 tablet by mouth daily.    [provider]  ondansetron  (ZOFRAN ) 4 MG tablet Take 1 tablet (4 mg total) by mouth every 8 (eight) hours as needed for nausea or vomiting. 10/14/22   Nafziger, Darleene, NP  simvastatin  (ZOCOR ) 5 MG tablet TAKE 1 TABLET BY MOUTH DAILY 04/27/24   Nafziger, Darleene, NP  traMADol  (ULTRAM ) 50 MG tablet Take 1 tablet (50  mg total) by mouth 3 (three) times daily as needed. 10/11/24   Nafziger, Darleene, NP    Allergies: Celecoxib , Citalopram hydrobromide, Codeine, Latex, Meperidine hcl, Morphine  and codeine, Tape, and Trazodone hcl    Review of Systems  All other systems reviewed and are negative.   Updated Vital Signs BP 131/78   Pulse 90   Temp 98.1 F (36.7 C) (Oral)   Resp 15   Ht 5' (1.524 m)   Wt 78.5 kg   SpO2 97%   BMI 33.79 kg/m   Physical Exam Vitals and nursing note  reviewed.  Constitutional:      Appearance: She is well-developed.  HENT:     Head: Atraumatic.  Eyes:     Extraocular Movements: Extraocular movements intact.     Pupils: Pupils are equal, round, and reactive to light.     Comments: Right-sided horizontal nystagmus, peripheral visual field intact, no diplopia  Neck:     Comments: No midline C-spine tenderness Cardiovascular:     Rate and Rhythm: Normal rate.  Pulmonary:     Effort: Pulmonary effort is normal.  Musculoskeletal:        General: No deformity.     Cervical back: Normal range of motion and neck supple.  Skin:    General: Skin is warm and dry.  Neurological:     Mental Status: She is alert and oriented to person, place, and time.     Cranial Nerves: No cranial nerve deficit.     Sensory: No sensory deficit.     Motor: No weakness.     Coordination: Coordination normal.     Comments: No dysmetria     (all labs ordered are listed, but only abnormal results are displayed) Labs Reviewed  BASIC METABOLIC PANEL WITH GFR - Abnormal; Notable for the following components:      Result Value   Glucose, Bld 117 (*)    All other components within normal limits  CBG MONITORING, ED - Abnormal; Notable for the following components:   Glucose-Capillary 101 (*)    All other components within normal limits  CBC WITH DIFFERENTIAL/PLATELET  PROTIME-INR    EKG: None  Radiology: CT CERVICAL SPINE WO CONTRAST Result Date: 10/25/2024 EXAM: CT CERVICAL SPINE WITHOUT CONTRAST 10/25/2024 10:16:20 AM TECHNIQUE: CT of the cervical spine was performed without the administration of intravenous contrast. Multiplanar reformatted images are provided for review. Automated exposure control, iterative reconstruction, and/or weight based adjustment of the mA/kV was utilized to reduce the radiation dose to as low as reasonably achievable. COMPARISON: CT cervical myelogram dated 06/21/2024. CLINICAL HISTORY: Neck trauma, mechanically unstable  spine (Age >= 16y). FINDINGS: CERVICAL SPINE: BONES AND ALIGNMENT: No acute fracture or traumatic malalignment. There is slight degenerative anterolisthesis at C2-C3 and C7-T1. The patient is status post interbody fusion and anterior spinal fixation of C4-C7. The anchoring orthopedic screws at C7 are again noted to be fused. The patient is status post bilateral posterolateral spinal fusion at C6-C7. DEGENERATIVE CHANGES: There is right-sided uncovertebral joint-type hypertrophy and facet arthrosis at C2-C3, causing moderate right-sided neural foraminal stenosis. There is also right-sided uncovertebral joint hypertrophy and facet arthrosis at C3-C4, causing moderate-to-severe right neural foraminal stenosis. SOFT TISSUES: No prevertebral soft tissue swelling. IMPRESSION: 1. No evidence of fracture or acute traumatic injury. 2. Slight degenerative anterolisthesis at C2-3 and C7-T1. 3. Status post interbody fusion and anterior spinal fixation of C4 through C7 with fused anchoring orthopedic screws at C7. 4. Status post bilateral posterolateral spinal fusion at  C6-7. 5. Right-sided uncovertebral joint-type hypertrophy and facet arthrosis at C2-3, causing moderate right-sided neural foraminal stenosis. 6. Right-sided uncovertebral joint hypertrophy and facet arthrosis at C3-4, causing moderate-to-severe right neural foraminal stenosis. Electronically signed by: Evalene Coho MD 10/25/2024 10:31 AM EST RP Workstation: [id number]   CT HEAD WO CONTRAST Result Date: 10/25/2024 EXAM: CT HEAD WITHOUT 10/25/2024 10:16:20 AM TECHNIQUE: CT of the head was performed without the administration of intravenous contrast. Automated exposure control, iterative reconstruction, and/or weight based adjustment of the mA/kV was utilized to reduce the radiation dose to as low as reasonably achievable. COMPARISON: CT of the head dated 05/25/2021. CLINICAL HISTORY: Head trauma, moderate-severe. FINDINGS: BRAIN AND VENTRICLES: No acute  intracranial hemorrhage. No mass effect or midline shift. No extra-axial fluid collection. No evidence of acute infarct. No hydrocephalus. ORBITS: Bilateral lens replacement. SINUSES AND MASTOIDS: No acute abnormality. SOFT TISSUES AND SKULL: Soft tissue stranding and hematoma along posterior right scalp. No acute skull fracture. IMPRESSION: 1. No acute intracranial abnormality. 2. Soft tissue stranding and hematoma along posterior right scalp. Electronically signed by: Evalene Coho MD 10/25/2024 10:25 AM EST RP Workstation: [id number]     Procedures   Medications Ordered in the ED  promethazine  (PHENERGAN ) tablet 25 mg (has no administration in time range)  metoCLOPramide  (REGLAN ) injection 10 mg (10 mg Intravenous Given 10/25/24 0945)  diazepam  (VALIUM ) injection 2.5 mg (2.5 mg Intravenous Given 10/25/24 1109)  ketorolac  (TORADOL ) 30 MG/ML injection 15 mg (15 mg Intravenous Given 10/25/24 1108)  meclizine  (ANTIVERT ) tablet 50 mg (50 mg Oral Given 10/25/24 1107)                                    Medical Decision Making Amount and/or Complexity of Data Reviewed Labs: ordered. Radiology: ordered.  Risk Prescription drug management.   78 year old patient comes in after sustaining what appears to be a mechanical fall. Pertinent past medical includes previous cervical spine surgery, previous concussion and no anticoagulation use. Collateral history provided by patient's sister who is at the bedside.   Based on my history and exam, differential diagnosis includes: - Traumatic brain injury including intracranial hemorrhage - Cervical spine fracture - Concussion - Vertebral dissection - BPPV  Based on the initial assessment, the following workup was initiated-CT scan of the brain, CT cervical spine and basic labs.  I will give patient IV Valium  along with oral meclizine  and Reglan  for her symptoms of dizziness and nausea.  On my exam, patient has horizontal nystagmus and her  symptoms are worse with neck movement, signifying likely peripheral vertigo.  She has no focal neurocomplaints that are new otherwise.  If patient does not feel better after the initial round of medications, then we will consider CT angiogram.  Reassessment : I independently interpreted patient CT scan of the brain, there is no evidence of brain bleed. Upon reassessment, patient states that she had just gone to the bathroom.  She felt a lot better after the medication.  The vertigo has not resolved.  On my repeat exam, she still has horizontal nystagmus, particularly with right sided gaze.  I discussed with the patient that most likely she has a concussion with vertigo as a side effect and nausea could be because of concussion or the vertigo. At this time, our plan would be for patient to follow-up with her PCP if her symptoms do not improve, otherwise return to the ER if her  symptoms are worsening.  She will need to take significant precautions to prevent any falls.  Patient is comfortable going home at this time.  Sister at the bedside states that she will stay with her today.  I shared with the patient that she has known history of cervical spine degenerative spine disease which was picked up on our CT scan as well.  Our plan was that if she was not improving, we would have proceeded with CT angiogram and also considered MRI of the spine, to rule out myelopathy.  However given that her symptoms have improved, we will opt for wait and watch approach.  She will need to return to the ER if her symptoms get worse, but can see PCP if the symptoms persist, and they may pursue additional testing.  Patient fortunately has seen concussion doctors in the past and continues to follow them as well.  She has seen Dr. Joshua for her neurosurgical issues.  Patient is reliable and is an ideal candidate for wait and watch approach.   Final diagnoses:  Vertigo  Concussion without loss of consciousness, initial  encounter    ED Discharge Orders          Ordered    promethazine  (PHENERGAN ) 25 MG tablet  Every 8 hours PRN        10/25/24 1354    meclizine  (ANTIVERT ) 25 MG tablet  3 times daily PRN        10/25/24 1354               Charlyn Sora, MD 10/25/24 1410

## 2024-10-25 NOTE — ED Triage Notes (Signed)
 Patient arrives EMS from home. Patient fell and hit the back of her head last night. Denies LOC. Pt did vomit once last night and has felt nauseous since. Hx of dilated aorta. Denies blood thinners.   EMS Vitals  146/88 98% RA 96 bpm  GBG 150

## 2024-10-25 NOTE — Discharge Instructions (Addendum)
 You were seen in the emergency room for dizziness.  We suspect that your dizziness and nausea is likely vertigo, possibly due to concussion.  Please call your PCP and set up an appointment in 1 week.  If you are not getting better, then it might be worthwhile seeing them.  If however you start having worsening symptoms, you start developing new symptoms such as one-sided weakness, numbness, slurred speech, double vision or difficulty in swallowing, please call 911.  Please do everything you can to prevent falls.

## 2024-10-26 ENCOUNTER — Ambulatory Visit: Payer: Self-pay

## 2024-10-26 NOTE — Telephone Encounter (Signed)
 Pharmacy comment: we can no longer obtain the amneal brand of levothyroxine  .  can we switch the patient to the macleods brand?

## 2024-10-26 NOTE — Telephone Encounter (Signed)
 FYI Only or Action Required?: Action required by provider: medication refill request and update on patient condition.  Patient was last seen in primary care on 10/11/2024 by Merna Huxley, NP.  Called Nurse Triage reporting Medication Refill: levothyroxine    Triage Disposition: Call PCP When Office is Open  Patient/caregiver understands and will follow disposition?: Yes   Copied from CRM #[id number]. Topic: Clinical - Red Word Triage >> Oct 26, 2024  5:46 PM Jayma L wrote: Red Word that prompted transfer to Nurse Triage: patient fell yesterday and was seen in ER , since she's having dizziness and she's been without meds for 3 days   Reason for Disposition  [1] Prescription refill request for NON-ESSENTIAL medicine (i.e., no harm to patient if med not taken) AND [2] triager unable to refill per department policy  Answer Assessment - Initial Assessment Questions 1. DRUG NAME: What medicine do you need to have refilled?      Pt states she has been out of Levothyroxine  for 3 days. Pt requests Rx be sent to [facility].  Pt was recently hospitalized for fall and dx with concussion. States she was cleared but does have a concussion and on medication. Pt reports this was her 4th fall this year. Reassured her I would pass information and request to PCP.  Protocols used: Medication Refill and Renewal Call-A-AH

## 2024-10-27 ENCOUNTER — Encounter: Payer: Self-pay | Admitting: Adult Health

## 2024-10-27 ENCOUNTER — Telehealth: Payer: Self-pay | Admitting: *Deleted

## 2024-10-27 NOTE — Telephone Encounter (Signed)
 Ok given to pharmacy. No further action needed.

## 2024-10-27 NOTE — Telephone Encounter (Signed)
 Copied from CRM #[id number]. Topic: Clinical - Medication Question >> Oct 27, 2024  1:12 PM Alfonso HERO wrote: Reason for CRM: patient calling asked if someone can call the pharmacy to give the ok for the levothyroxine  (SYNTHROID ) 75 MCG tablet change. They have to have a verbal ok in order to fill the rx.

## 2024-10-28 ENCOUNTER — Other Ambulatory Visit: Payer: Self-pay

## 2024-10-28 MED ORDER — PANTOPRAZOLE SODIUM 40 MG PO TBEC: 40.0000 mg | DELAYED_RELEASE_TABLET | Freq: Every day | ORAL | 3 refills | Status: AC

## 2024-10-28 NOTE — Telephone Encounter (Signed)
 This was taken care of

## 2024-10-28 NOTE — Telephone Encounter (Signed)
 Please advise

## 2024-11-14 ENCOUNTER — Ambulatory Visit: Payer: Self-pay | Admitting: *Deleted

## 2024-11-14 NOTE — Telephone Encounter (Signed)
 FYI Only or Action Required?: FYI only for provider: appt for hospital f/u already scheduled for 11/18/24.  Patient was last seen in primary care on 10/11/2024 by Merna Huxley, NP.  Called Nurse Triage reporting Dizziness.s/p fall 10/24/24  Symptoms began several weeks ago.  Interventions attempted: Prescription medications: meclizine  and Rest, hydration, or home remedies.  Symptoms are: stable.  Triage Disposition: See PCP When Office is Open (Within 3 Days)  Patient/caregiver understands and will follow disposition?: Yes    Call from [facility] to triage patient. Hospital f/u appt scheduled for 11/18/24. No new sx . See NT encounter                 Reason for Disposition  [1] MODERATE dizziness (e.g., interferes with normal activities) AND [2] has been evaluated by doctor (or NP/PA) for this  Answer Assessment - Initial Assessment Questions S/p fall 10/24/24. F/u visit already scheduled 11/19/23. No new sx or worsening sx reported today , no chest pain no difficulty breathing no N/V no weakness on either side of body, no visual changes. Provided strict precautions to return to ED or call 911 if sx occur or return.        1. DESCRIPTION: Describe your dizziness.     Room spinning at times since fall on 10/24/24.  2. LIGHTHEADED: Do you feel lightheaded? (e.g., somewhat faint, woozy, weak upon standing)     Off balance with standing, no faint feeling 3. VERTIGO: Do you feel like either you or the room is spinning or tilting? (i.e., vertigo)     Room spinning laying down in bed 4. SEVERITY: How bad is it?  Do you feel like you are going to faint? Can you stand and walk?     Not severe, using walker as needed 5. ONSET:  When did the dizziness begin?     After fall 10/24/24. 6. AGGRAVATING FACTORS: Does anything make it worse? (e.g., standing, change in head position)     Laying down 7. HEART RATE: Can you tell me your heart rate? How many beats in 15  seconds?  (Note: Not all patients can do this.)       na 8. CAUSE: What do you think is causing the dizziness? (e.g., decreased fluids or food, diarrhea, emotional distress, heat exposure, new medicine, sudden standing, vomiting; unknown)     Fell 10/24/24 hitting back of head 9. RECURRENT SYMPTOM: Have you had dizziness before? If Yes, ask: When was the last time? What happened that time?     Yes has had 2 injuries to head 10. OTHER SYMPTOMS: Do you have any other symptoms? (e.g., fever, chest pain, vomiting, diarrhea, bleeding)       Feels jittery at times, dizziness when sitting up, or standing. Laying down causes some room spinning, not severe. Slight headache , mild blurred vision and reports no change in vision since fall hx of visual issues. No vomiting . Mild nausea persists since fall.  11. PREGNANCY: Is there any chance you are pregnant? When was your last menstrual period?       na  Protocols used: Dizziness - Lightheadedness-A-AH

## 2024-11-18 ENCOUNTER — Ambulatory Visit: Admitting: Adult Health

## 2024-11-18 ENCOUNTER — Encounter: Payer: Self-pay | Admitting: Adult Health

## 2024-11-18 VITALS — BP 120/82 | HR 108 | Temp 99.2°F | Ht 60.0 in | Wt 169.0 lb

## 2024-11-18 DIAGNOSIS — S060X0D Concussion without loss of consciousness, subsequent encounter: Secondary | ICD-10-CM

## 2024-11-18 DIAGNOSIS — Z6833 Body mass index (BMI) 33.0-33.9, adult: Secondary | ICD-10-CM | POA: Diagnosis not present

## 2024-11-18 DIAGNOSIS — H8113 Benign paroxysmal vertigo, bilateral: Secondary | ICD-10-CM

## 2024-11-18 MED ORDER — WEGOVY 0.25 MG/0.5ML ~~LOC~~ SOAJ: 0.2500 mg | SUBCUTANEOUS | 0 refills | Status: DC

## 2024-11-18 MED ORDER — MECLIZINE HCL 25 MG PO TABS: 25.0000 mg | ORAL_TABLET | Freq: Three times a day (TID) | ORAL | 1 refills | Status: AC | PRN

## 2024-11-18 MED ORDER — PROMETHAZINE HCL 25 MG PO TABS: 25.0000 mg | ORAL_TABLET | Freq: Three times a day (TID) | ORAL | 0 refills | Status: AC | PRN

## 2024-11-18 NOTE — Progress Notes (Signed)
 "  Subjective:    Patient ID: [name], female    DOB: 12/18/1945, 79 y.o.   MRN: [id number]  HPI Discussed the use of AI scribe software for clinical note transcription with the patient, who gave verbal consent to proceed.  History of Present Illness   [name] is a 79 year old female who presents with dizziness and imbalance following a recent fall.  She recently fell backward onto cement with immediate vomiting and vertigo. She was unable to move for several hours before calling for help. CT head and cervical spine at the hospital reportedly showed no fracture or bleeding.  She still has dizziness when getting out of bed or standing from sitting, which improves once she is walking. She used prescribed meclizine  and a nausea medication for four days with good relief.  She has prior concussions and takes Adderall, which she feels improves her post-concussion symptoms. She has a follow up next month with the concussion clinic   Additionally, she does have a history of obesity and was using Ozempic  in the past with good results until her insurance stopped covering it. She spoke with her insurance agent and was told that GLP-1's would be covered for her. She does not have a history of diabetes.        Review of Systems See HPI   Past Medical History:  Diagnosis Date   Anxiety    Arthritis    Chronic back pain    Chronic kidney disease 2019   ARF while in hospital for PLIF   Depression    GERD (gastroesophageal reflux disease)    Hyperlipidemia    Hypertension    Hypothyroidism    Macular degeneration of both eyes    not age related   Osteopenia    PONV (postoperative nausea and vomiting)    also is slow to wake up   Prediabetes     Social History   Socioeconomic History   Marital status: Widowed    Spouse name: Not on file   Number of children: Not on file   Years of education: Not on file   Highest education level: 12th grade  Occupational History    Not on file  Tobacco Use   Smoking status: Never   Smokeless tobacco: Never  Vaping Use   Vaping status: Never Used  Substance and Sexual Activity   Alcohol use: Never   Drug use: Never   Sexual activity: Not on file  Other Topics Concern   Not on file  Social History Narrative   Not on file   Social Drivers of Health   Tobacco Use: Low Risk (11/18/2024)   Patient History    Smoking Tobacco Use: Never    Smokeless Tobacco Use: Never    Passive Exposure: Not on file  Financial Resource Strain: Low Risk (04/28/2024)   Overall Financial Resource Strain (CARDIA)    Difficulty of Paying Living Expenses: Not very hard  Food Insecurity: No Food Insecurity (04/28/2024)   Epic    Worried About Programme Researcher, Broadcasting/film/video in the Last Year: Never true    Ran Out of Food in the Last Year: Never true  Transportation Needs: No Transportation Needs (04/28/2024)   Epic    Lack of Transportation (Medical): No    Lack of Transportation (Non-Medical): No  Physical Activity: Inactive (04/28/2024)   Exercise Vital Sign    Days of Exercise per Week: 0 days    Minutes of Exercise per Session: 0 min  Stress: No Stress Concern Present (04/28/2024)   [employer] of Occupational Health - Occupational Stress Questionnaire    Feeling of Stress: Not at all  Social Connections: Moderately Isolated (04/28/2024)   Social Connection and Isolation Panel    Frequency of Communication with Friends and Family: More than three times a week    Frequency of Social Gatherings with Friends and Family: More than three times a week    Attends Religious Services: 1 to 4 times per year    Active Member of [employer] or Organizations: No    Attends Banker Meetings: Not on file    Marital Status: Widowed  Intimate Partner Violence: Not on file  Depression (PHQ2-9): Low Risk (11/18/2024)   Depression (PHQ2-9)    PHQ-2 Score: 1  Alcohol Screen: Low Risk (04/28/2024)   Alcohol Screen    Last Alcohol Screening Score  (AUDIT): 0  Housing: Low Risk (04/28/2024)   Epic    Unable to Pay for Housing in the Last Year: No    Number of Times Moved in the Last Year: 0    Homeless in the Last Year: No  Utilities: Not At Risk (04/28/2024)   Epic    Threatened with loss of utilities: No  Health Literacy: Adequate Health Literacy (04/28/2024)   B1300 Health Literacy    Frequency of need for help with medical instructions: Never    Past Surgical History:  Procedure Laterality Date   ABDOMINAL HYSTERECTOMY     total   ANTERIOR CERVICAL DISCECTOMY     APPENDECTOMY     BACK SURGERY     PLIF   BUNIONECTOMY Right    CHOLECYSTECTOMY     COLONOSCOPY     HAND SURGERY Left    thumb joint, carpal tunnel and cyst   HAND SURGERY Right    thumb and carpal tunnel   POSTERIOR CERVICAL FUSION/FORAMINOTOMY      Family History  Problem Relation Age of Onset   Breast cancer Neg Hx     Allergies[1]  Medications Ordered Prior to Encounter[2]  BP 120/82   Pulse (!) 108   Temp 99.2 F (37.3 C) (Oral)   Ht 5' (1.524 m)   Wt 169 lb (76.7 kg)   SpO2 96%   BMI 33.01 kg/m       Objective:   Physical Exam Vitals and nursing note reviewed.  Constitutional:      Appearance: Normal appearance. She is obese.  HENT:     Right Ear: No mastoid tenderness. No hemotympanum.     Left Ear: No mastoid tenderness. No hemotympanum.  Eyes:     Extraocular Movements:     Right eye: Nystagmus (hozizontal) present.     Left eye: Nystagmus (horizontal) present.  Cardiovascular:     Rate and Rhythm: Normal rate and regular rhythm.     Pulses: Normal pulses.     Heart sounds: Normal heart sounds.  Pulmonary:     Effort: Pulmonary effort is normal.     Breath sounds: Normal breath sounds.  Musculoskeletal:        General: Normal range of motion.  Skin:    General: Skin is warm and dry.  Neurological:     General: No focal deficit present.     Mental Status: She is alert and oriented to person, place, and time.      Cranial Nerves: Cranial nerves 2-12 are intact. No cranial nerve deficit or facial asymmetry.     Sensory: Sensation is intact.  Motor: Motor function is intact.     Coordination: Coordination is intact.  Psychiatric:        Mood and Affect: Mood normal.        Thought Content: Thought content normal.        Judgment: Judgment normal.           Assessment & Plan:  Assessment and Plan    Concussion CT scan normal. BPPV symptoms improved with meclizine  and Phenergan . - Continue meclizine  and Phenergan  as needed- we will refill.  - Follow up with concussion clinic next month   Benign paroxysmal positional vertigo Symptoms improved with current treatment.  Morbid obesity Discussed weight loss medications and potential side effects. Considering weight loss for health improvement. - Submitted prescription for Wegovy  and checked insurance coverage. - Consider Zepbound if Wegovy  not covered. - Encouraged weight loss through diet and increased activity.           [1]  Allergies Allergen Reactions   Celecoxib      Other reaction(s): itching   Citalopram Hydrobromide     Other reaction(s): nausea   Codeine Nausea Only and Other (See Comments)    Severe headache   Latex Itching    Other reaction(s): itching----GLOVES   Meperidine Hcl     Other reaction(s): itching   Morphine  And Codeine Nausea And Vomiting    And headaches   Tape Itching   Trazodone Hcl     Other reaction(s): nightmares  [2]  Current Outpatient Medications on File Prior to Visit  Medication Sig Dispense Refill   ALPRAZolam  (XANAX ) 0.5 MG tablet Take 1 tablet (0.5 mg total) by mouth at bedtime as needed for anxiety. 30 tablet 2   amphetamine -dextroamphetamine  (ADDERALL) 30 MG tablet Take 1 tablet by mouth daily. 90 tablet 0   Bacillus Coagulans-Inulin (PROBIOTIC) 1-250 BILLION-MG CAPS      cyclobenzaprine  (FLEXERIL ) 5 MG tablet Take 1 tablet (5 mg total) by mouth at bedtime as needed for muscle  spasms. 90 tablet 1   DULoxetine  (CYMBALTA ) 30 MG capsule TAKE 1 CAPSULE BY MOUTH AT BEDTIME 90 capsule 1   fluticasone  (FLONASE ) 50 MCG/ACT nasal spray Place 2 sprays into both nostrils daily. 16 g 6   levothyroxine  (SYNTHROID ) 75 MCG tablet TAKE 1 TABLET BY MOUTH DAILY BEFORE BREAKFAST 90 tablet 3   lisinopril  (ZESTRIL ) 10 MG tablet TAKE 1 TABLET BY MOUTH DAILY 90 tablet 1   meclizine  (ANTIVERT ) 25 MG tablet Take 1 tablet (25 mg total) by mouth 3 (three) times daily as needed for dizziness. 30 tablet 0   Multiple Vitamin (MULTIVITAMIN WITH MINERALS) TABS tablet Take 1 tablet by mouth daily.     pantoprazole  (PROTONIX ) 40 MG tablet Take 1 tablet (40 mg total) by mouth daily. 30 tablet 3   promethazine  (PHENERGAN ) 25 MG tablet Take 1 tablet (25 mg total) by mouth every 8 (eight) hours as needed for nausea or vomiting. 20 tablet 0   simvastatin  (ZOCOR ) 5 MG tablet TAKE 1 TABLET BY MOUTH DAILY 90 tablet 3   traMADol  (ULTRAM ) 50 MG tablet Take 1 tablet (50 mg total) by mouth 3 (three) times daily as needed. 90 tablet 2   No current facility-administered medications on file prior to visit.   "

## 2024-11-22 ENCOUNTER — Ambulatory Visit: Admitting: Adult Health

## 2024-12-05 ENCOUNTER — Other Ambulatory Visit (HOSPITAL_COMMUNITY): Payer: Self-pay

## 2024-12-05 ENCOUNTER — Telehealth: Payer: Self-pay

## 2024-12-05 NOTE — Telephone Encounter (Signed)
 Pharmacy Patient Advocate Encounter   Received notification from [employer] [facility] that prior authorization for Wegovy  0.25MG /0.5ML auto-injectors  is required/requested.   Insurance verification completed.   The patient is insured through [employer]   Per test claim: Per test claim, medication is not covered due to plan/benefit exclusion, PA not submitted at this time    Medicare only covers GLP1 medications for weight loss when the patient has a documented history of a heart attack or stroke.

## 2024-12-06 ENCOUNTER — Other Ambulatory Visit: Payer: Self-pay | Admitting: Adult Health

## 2024-12-06 MED ORDER — ZEPBOUND 2.5 MG/0.5ML ~~LOC~~ SOAJ: 2.5000 mg | SUBCUTANEOUS | 0 refills | Status: DC

## 2024-12-06 NOTE — Telephone Encounter (Signed)
 Pt wants to try Zepbound . Pt stated she called her insurance and was advised that it was covered.

## 2024-12-07 ENCOUNTER — Telehealth: Payer: Self-pay | Admitting: Pharmacist

## 2024-12-07 ENCOUNTER — Other Ambulatory Visit (HOSPITAL_COMMUNITY): Payer: Self-pay

## 2024-12-07 NOTE — Telephone Encounter (Signed)
 Noted.

## 2024-12-07 NOTE — Telephone Encounter (Signed)
 Left detailed message informing  of update.

## 2024-12-07 NOTE — Telephone Encounter (Signed)
 Pharmacy Patient Advocate Encounter   Received notification from [facility] that prior authorization for Zepbound  2.5mg  is required/requested.   Insurance verification completed.   The patient is insured through [facility].   Per test claim: Per test claim, medication is not covered due to plan/benefit exclusion, PA not submitted at this time

## 2024-12-14 ENCOUNTER — Ambulatory Visit: Admitting: Family Medicine

## 2024-12-14 ENCOUNTER — Other Ambulatory Visit: Payer: Self-pay | Admitting: Adult Health

## 2024-12-14 VITALS — BP 128/82 | HR 98 | Ht 60.0 in | Wt 169.0 lb

## 2024-12-14 DIAGNOSIS — M5416 Radiculopathy, lumbar region: Secondary | ICD-10-CM

## 2024-12-14 DIAGNOSIS — R296 Repeated falls: Secondary | ICD-10-CM

## 2024-12-14 DIAGNOSIS — M5412 Radiculopathy, cervical region: Secondary | ICD-10-CM | POA: Diagnosis not present

## 2024-12-14 DIAGNOSIS — R2689 Other abnormalities of gait and mobility: Secondary | ICD-10-CM

## 2024-12-14 DIAGNOSIS — Z981 Arthrodesis status: Secondary | ICD-10-CM | POA: Diagnosis not present

## 2024-12-14 DIAGNOSIS — S060X0A Concussion without loss of consciousness, initial encounter: Secondary | ICD-10-CM | POA: Diagnosis not present

## 2024-12-14 MED ORDER — AMPHETAMINE-DEXTROAMPHETAMINE 30 MG PO TABS: 30.0000 mg | ORAL_TABLET | Freq: Every day | ORAL | 0 refills | Status: AC

## 2024-12-14 NOTE — Progress Notes (Unsigned)
 "        I, Angel Collet, PhD, LAT, ATC acting as a scribe for Angel Lloyd, MD.  [name] is a 79 y.o. female who presents to [facility] at [facility] today for 76-month f/u neck and back pain and post-concussion syndrome. Pt was last seen by Dr. Lloyd on 09/13/24 cyclobenzaprine  was refilled, referred to Dr. Darlis, and advised to cont current regimen.   Today, pt reports on 12/8 she suffered a bad fall. She was putting a light on the mantel and fell backwards hitting her head on the hard floor. EMS transported to the ED. Pt c/o cont'd fatigue, dizziness, nausea, lack of focus, and off-balance. Neck and back pain is about the same. She is wondering about her CT results and possible spinal nerve stimulator.  Dominant issues now or dizziness and impaired balance.  She denies significant or severe headache.  She does have chronic back and neck pain.  She is a patient at [facility] seeing Dr. Joshua.  Dx imaging: 06/21/24 L-spine & c-spine CT myelogram             06/13/24 C-spine XR 04/15/23 L-spine XR   Pertinent review of systems: No fevers or chills  Relevant historical information: History of concussion   Exam:  BP 128/82   Pulse 98   Ht 5' (1.524 m)   Wt 169 lb (76.7 kg)   SpO2 95%   BMI 33.01 kg/m  General: Well Developed, well nourished, and in no acute distress.   Neuropsych: Alert and oriented.  Normal thought process and affect.  Normal speech.  Impaired balance.  Normal coordination.    Lab and Radiology Results  CT CERVICAL SPINE WO CONTRAST Result Date: 10/25/2024 EXAM: CT CERVICAL SPINE WITHOUT CONTRAST 10/25/2024 10:16:20 AM TECHNIQUE: CT of the cervical spine was performed without the administration of intravenous contrast. Multiplanar reformatted images are provided for review. Automated exposure control, iterative reconstruction, and/or weight based adjustment of the mA/kV was utilized to reduce the radiation dose to as low as reasonably  achievable. COMPARISON: CT cervical myelogram dated 06/21/2024. CLINICAL HISTORY: Neck trauma, mechanically unstable spine (Age >= 16y). FINDINGS: CERVICAL SPINE: BONES AND ALIGNMENT: No acute fracture or traumatic malalignment. There is slight degenerative anterolisthesis at C2-C3 and C7-T1. The patient is status post interbody fusion and anterior spinal fixation of C4-C7. The anchoring orthopedic screws at C7 are again noted to be fused. The patient is status post bilateral posterolateral spinal fusion at C6-C7. DEGENERATIVE CHANGES: There is right-sided uncovertebral joint-type hypertrophy and facet arthrosis at C2-C3, causing moderate right-sided neural foraminal stenosis. There is also right-sided uncovertebral joint hypertrophy and facet arthrosis at C3-C4, causing moderate-to-severe right neural foraminal stenosis. SOFT TISSUES: No prevertebral soft tissue swelling. IMPRESSION: 1. No evidence of fracture or acute traumatic injury. 2. Slight degenerative anterolisthesis at C2-3 and C7-T1. 3. Status post interbody fusion and anterior spinal fixation of C4 through C7 with fused anchoring orthopedic screws at C7. 4. Status post bilateral posterolateral spinal fusion at C6-7. 5. Right-sided uncovertebral joint-type hypertrophy and facet arthrosis at C2-3, causing moderate right-sided neural foraminal stenosis. 6. Right-sided uncovertebral joint hypertrophy and facet arthrosis at C3-4, causing moderate-to-severe right neural foraminal stenosis. Electronically signed by: Evalene Coho MD 10/25/2024 10:31 AM EST RP Workstation: [id number]   CT HEAD WO CONTRAST Result Date: 10/25/2024 EXAM: CT HEAD WITHOUT 10/25/2024 10:16:20 AM TECHNIQUE: CT of the head was performed without the administration of intravenous contrast. Automated exposure control, iterative reconstruction, and/or weight based  adjustment of the mA/kV was utilized to reduce the radiation dose to as low as reasonably achievable. COMPARISON: CT of  the head dated 05/25/2021. CLINICAL HISTORY: Head trauma, moderate-severe. FINDINGS: BRAIN AND VENTRICLES: No acute intracranial hemorrhage. No mass effect or midline shift. No extra-axial fluid collection. No evidence of acute infarct. No hydrocephalus. ORBITS: Bilateral lens replacement. SINUSES AND MASTOIDS: No acute abnormality. SOFT TISSUES AND SKULL: Soft tissue stranding and hematoma along posterior right scalp. No acute skull fracture. IMPRESSION: 1. No acute intracranial abnormality. 2. Soft tissue stranding and hematoma along posterior right scalp. Electronically signed by: Evalene Coho MD 10/25/2024 10:25 AM EST RP Workstation: [name] Angel Joane, personally (independently) visualized and performed the interpretation of the images attached in this note.     Assessment and Plan: 79 y.o. female with recurrent/new concussion occurring on or around October 25, 2024 after a fall at home.  She was evaluated the emergency room and had CT scan of the head and neck that did not show any acute or new findings.  She was seen by her primary care provider on January 2.  Overall she is doing okay with some continued dizziness symptoms.  Plan to refer to vestibular physical therapy.  She has had vestibular PT with previous concussion in the past which did help.  Additionally she has chronic back pain.  She has had pretty extensive surgery.  Will refer again to Dr. Darlis at [facility] for evaluation of interventions including potential spine stimulator.  Old concussion.  Currently managing attention and concentration issues with Adderall.  Refill Adderall.  Recheck in 3 months or sooner if needed.   PDMP reviewed during this encounter. Orders Placed This Encounter  Procedures   Ambulatory referral to Physical Therapy    Referral Priority:   Routine    Referral Type:   Physical Medicine    Referral Reason:   Specialty Services Required    Requested Specialty:   Physical  Therapy    Number of Visits Requested:   1   Ambulatory referral to Pain Clinic    Referral Priority:   Routine    Referral Type:   Consultation    Referral Reason:   Specialty Services Required    Requested Specialty:   Pain Medicine    Number of Visits Requested:   1   Meds ordered this encounter  Medications   amphetamine -dextroamphetamine  (ADDERALL) 30 MG tablet    Sig: Take 1 tablet by mouth daily.    Dispense:  90 tablet    Refill:  0     Discussed warning signs or symptoms. Please see discharge instructions. Patient expresses understanding.   The above documentation has been reviewed and is accurate and complete Angel Joane, M.D.  Total encounter time 30 minutes including face-to-face time with the patient and, reviewing past medical record, and charting on the date of service.    "

## 2024-12-14 NOTE — Patient Instructions (Addendum)
 Thank you for coming in today.   I've referred you to Physical Therapy.  Let us  know if you don't hear from them in one week.   I've referred you to Dr. Darlis.  Let us  know if you don't hear from them in one week.   Check back in 3 months

## 2025-01-03 ENCOUNTER — Ambulatory Visit: Admitting: Physical Therapy

## 2025-08-30 ENCOUNTER — Encounter (INDEPENDENT_AMBULATORY_CARE_PROVIDER_SITE_OTHER): Admitting: Ophthalmology
